# Patient Record
Sex: Male | Born: 1935
Health system: Southern US, Community
[De-identification: ages and names within clinical notes are randomized; demographics above are authoritative.]

## PROBLEM LIST (undated history)

## (undated) DIAGNOSIS — I214 Non-ST elevation (NSTEMI) myocardial infarction: Secondary | ICD-10-CM

## (undated) DIAGNOSIS — I10 Essential (primary) hypertension: Secondary | ICD-10-CM

## (undated) DIAGNOSIS — Z8719 Personal history of other diseases of the digestive system: Secondary | ICD-10-CM

## (undated) DIAGNOSIS — N182 Chronic kidney disease, stage 2 (mild): Secondary | ICD-10-CM

## (undated) DIAGNOSIS — E119 Type 2 diabetes mellitus without complications: Secondary | ICD-10-CM

## (undated) DIAGNOSIS — E785 Hyperlipidemia, unspecified: Secondary | ICD-10-CM

## (undated) DIAGNOSIS — I2510 Atherosclerotic heart disease of native coronary artery without angina pectoris: Secondary | ICD-10-CM

## (undated) HISTORY — PX: CATARACT EXTRACTION W/ INTRAOCULAR LENS IMPLANT: SHX1309

## (undated) HISTORY — PX: TONSILLECTOMY: SUR1361

## (undated) HISTORY — PX: EYE SURGERY: SHX253

---

## 2015-10-10 DIAGNOSIS — D099 Carcinoma in situ, unspecified: Secondary | ICD-10-CM

## 2015-10-10 DIAGNOSIS — C4492 Squamous cell carcinoma of skin, unspecified: Secondary | ICD-10-CM

## 2015-10-10 HISTORY — DX: Squamous cell carcinoma of skin, unspecified: C44.92

## 2015-10-10 HISTORY — DX: Carcinoma in situ, unspecified: D09.9

## 2016-03-25 DIAGNOSIS — B351 Tinea unguium: Secondary | ICD-10-CM | POA: Diagnosis not present

## 2016-03-25 DIAGNOSIS — E1142 Type 2 diabetes mellitus with diabetic polyneuropathy: Secondary | ICD-10-CM | POA: Diagnosis not present

## 2016-03-25 DIAGNOSIS — L609 Nail disorder, unspecified: Secondary | ICD-10-CM | POA: Diagnosis not present

## 2016-03-25 DIAGNOSIS — E1140 Type 2 diabetes mellitus with diabetic neuropathy, unspecified: Secondary | ICD-10-CM | POA: Diagnosis not present

## 2016-04-03 DIAGNOSIS — I10 Essential (primary) hypertension: Secondary | ICD-10-CM | POA: Diagnosis not present

## 2016-04-03 DIAGNOSIS — E1142 Type 2 diabetes mellitus with diabetic polyneuropathy: Secondary | ICD-10-CM | POA: Diagnosis not present

## 2016-04-11 DIAGNOSIS — K224 Dyskinesia of esophagus: Secondary | ICD-10-CM | POA: Diagnosis not present

## 2016-04-11 DIAGNOSIS — E663 Overweight: Secondary | ICD-10-CM | POA: Diagnosis not present

## 2016-04-11 DIAGNOSIS — E1359 Other specified diabetes mellitus with other circulatory complications: Secondary | ICD-10-CM | POA: Diagnosis not present

## 2016-04-11 DIAGNOSIS — I10 Essential (primary) hypertension: Secondary | ICD-10-CM | POA: Diagnosis not present

## 2016-04-11 DIAGNOSIS — E559 Vitamin D deficiency, unspecified: Secondary | ICD-10-CM | POA: Diagnosis not present

## 2016-04-11 DIAGNOSIS — E785 Hyperlipidemia, unspecified: Secondary | ICD-10-CM | POA: Diagnosis not present

## 2016-04-11 DIAGNOSIS — E1165 Type 2 diabetes mellitus with hyperglycemia: Secondary | ICD-10-CM | POA: Diagnosis not present

## 2016-04-18 DIAGNOSIS — E1142 Type 2 diabetes mellitus with diabetic polyneuropathy: Secondary | ICD-10-CM | POA: Diagnosis not present

## 2016-04-18 DIAGNOSIS — I10 Essential (primary) hypertension: Secondary | ICD-10-CM | POA: Diagnosis not present

## 2016-04-21 DIAGNOSIS — I10 Essential (primary) hypertension: Secondary | ICD-10-CM | POA: Diagnosis not present

## 2016-04-21 DIAGNOSIS — E785 Hyperlipidemia, unspecified: Secondary | ICD-10-CM | POA: Diagnosis not present

## 2016-04-21 DIAGNOSIS — Z9114 Patient's other noncompliance with medication regimen: Secondary | ICD-10-CM | POA: Diagnosis not present

## 2016-04-21 DIAGNOSIS — I7389 Other specified peripheral vascular diseases: Secondary | ICD-10-CM | POA: Diagnosis not present

## 2016-04-21 DIAGNOSIS — R319 Hematuria, unspecified: Secondary | ICD-10-CM | POA: Diagnosis not present

## 2016-04-21 DIAGNOSIS — E663 Overweight: Secondary | ICD-10-CM | POA: Diagnosis not present

## 2016-04-21 DIAGNOSIS — K8020 Calculus of gallbladder without cholecystitis without obstruction: Secondary | ICD-10-CM | POA: Diagnosis not present

## 2016-04-21 DIAGNOSIS — E559 Vitamin D deficiency, unspecified: Secondary | ICD-10-CM | POA: Diagnosis not present

## 2016-04-21 DIAGNOSIS — E1359 Other specified diabetes mellitus with other circulatory complications: Secondary | ICD-10-CM | POA: Diagnosis not present

## 2016-04-21 DIAGNOSIS — E1165 Type 2 diabetes mellitus with hyperglycemia: Secondary | ICD-10-CM | POA: Diagnosis not present

## 2016-04-21 DIAGNOSIS — K224 Dyskinesia of esophagus: Secondary | ICD-10-CM | POA: Diagnosis not present

## 2016-04-21 DIAGNOSIS — R938 Abnormal findings on diagnostic imaging of other specified body structures: Secondary | ICD-10-CM | POA: Diagnosis not present

## 2016-04-30 DIAGNOSIS — K8020 Calculus of gallbladder without cholecystitis without obstruction: Secondary | ICD-10-CM | POA: Diagnosis not present

## 2016-04-30 DIAGNOSIS — N281 Cyst of kidney, acquired: Secondary | ICD-10-CM | POA: Diagnosis not present

## 2016-04-30 DIAGNOSIS — R918 Other nonspecific abnormal finding of lung field: Secondary | ICD-10-CM | POA: Diagnosis not present

## 2016-05-16 DIAGNOSIS — I10 Essential (primary) hypertension: Secondary | ICD-10-CM | POA: Diagnosis not present

## 2016-05-16 DIAGNOSIS — E1142 Type 2 diabetes mellitus with diabetic polyneuropathy: Secondary | ICD-10-CM | POA: Diagnosis not present

## 2016-05-20 DIAGNOSIS — I10 Essential (primary) hypertension: Secondary | ICD-10-CM | POA: Diagnosis not present

## 2016-05-20 DIAGNOSIS — E1142 Type 2 diabetes mellitus with diabetic polyneuropathy: Secondary | ICD-10-CM | POA: Diagnosis not present

## 2016-05-20 DIAGNOSIS — E559 Vitamin D deficiency, unspecified: Secondary | ICD-10-CM | POA: Diagnosis not present

## 2016-05-20 DIAGNOSIS — E1165 Type 2 diabetes mellitus with hyperglycemia: Secondary | ICD-10-CM | POA: Diagnosis not present

## 2016-05-27 DIAGNOSIS — Z9114 Patient's other noncompliance with medication regimen: Secondary | ICD-10-CM | POA: Diagnosis not present

## 2016-05-27 DIAGNOSIS — E1359 Other specified diabetes mellitus with other circulatory complications: Secondary | ICD-10-CM | POA: Diagnosis not present

## 2016-05-27 DIAGNOSIS — E785 Hyperlipidemia, unspecified: Secondary | ICD-10-CM | POA: Diagnosis not present

## 2016-05-27 DIAGNOSIS — I7389 Other specified peripheral vascular diseases: Secondary | ICD-10-CM | POA: Diagnosis not present

## 2016-05-27 DIAGNOSIS — I10 Essential (primary) hypertension: Secondary | ICD-10-CM | POA: Diagnosis not present

## 2016-05-27 DIAGNOSIS — K224 Dyskinesia of esophagus: Secondary | ICD-10-CM | POA: Diagnosis not present

## 2016-05-27 DIAGNOSIS — E559 Vitamin D deficiency, unspecified: Secondary | ICD-10-CM | POA: Diagnosis not present

## 2016-05-27 DIAGNOSIS — E663 Overweight: Secondary | ICD-10-CM | POA: Diagnosis not present

## 2016-05-27 DIAGNOSIS — E1165 Type 2 diabetes mellitus with hyperglycemia: Secondary | ICD-10-CM | POA: Diagnosis not present

## 2016-06-03 DIAGNOSIS — E1140 Type 2 diabetes mellitus with diabetic neuropathy, unspecified: Secondary | ICD-10-CM | POA: Diagnosis not present

## 2016-06-03 DIAGNOSIS — L609 Nail disorder, unspecified: Secondary | ICD-10-CM | POA: Diagnosis not present

## 2016-06-03 DIAGNOSIS — E1142 Type 2 diabetes mellitus with diabetic polyneuropathy: Secondary | ICD-10-CM | POA: Diagnosis not present

## 2016-06-03 DIAGNOSIS — B351 Tinea unguium: Secondary | ICD-10-CM | POA: Diagnosis not present

## 2016-07-09 DIAGNOSIS — I10 Essential (primary) hypertension: Secondary | ICD-10-CM | POA: Diagnosis not present

## 2016-07-09 DIAGNOSIS — E1142 Type 2 diabetes mellitus with diabetic polyneuropathy: Secondary | ICD-10-CM | POA: Diagnosis not present

## 2016-07-18 DIAGNOSIS — A681 Tick-borne relapsing fever: Secondary | ICD-10-CM | POA: Diagnosis not present

## 2016-08-08 DIAGNOSIS — I10 Essential (primary) hypertension: Secondary | ICD-10-CM | POA: Diagnosis not present

## 2016-08-08 DIAGNOSIS — E1142 Type 2 diabetes mellitus with diabetic polyneuropathy: Secondary | ICD-10-CM | POA: Diagnosis not present

## 2016-08-19 DIAGNOSIS — L609 Nail disorder, unspecified: Secondary | ICD-10-CM | POA: Diagnosis not present

## 2016-08-19 DIAGNOSIS — E1140 Type 2 diabetes mellitus with diabetic neuropathy, unspecified: Secondary | ICD-10-CM | POA: Diagnosis not present

## 2016-08-19 DIAGNOSIS — E1142 Type 2 diabetes mellitus with diabetic polyneuropathy: Secondary | ICD-10-CM | POA: Diagnosis not present

## 2016-08-19 DIAGNOSIS — B351 Tinea unguium: Secondary | ICD-10-CM | POA: Diagnosis not present

## 2016-08-25 DIAGNOSIS — E1142 Type 2 diabetes mellitus with diabetic polyneuropathy: Secondary | ICD-10-CM | POA: Diagnosis not present

## 2016-08-25 DIAGNOSIS — I10 Essential (primary) hypertension: Secondary | ICD-10-CM | POA: Diagnosis not present

## 2016-08-26 DIAGNOSIS — I10 Essential (primary) hypertension: Secondary | ICD-10-CM | POA: Diagnosis not present

## 2016-08-26 DIAGNOSIS — E119 Type 2 diabetes mellitus without complications: Secondary | ICD-10-CM | POA: Diagnosis not present

## 2016-08-28 DIAGNOSIS — E1165 Type 2 diabetes mellitus with hyperglycemia: Secondary | ICD-10-CM | POA: Diagnosis not present

## 2016-09-03 DIAGNOSIS — Z9114 Patient's other noncompliance with medication regimen: Secondary | ICD-10-CM | POA: Diagnosis not present

## 2016-09-03 DIAGNOSIS — I7389 Other specified peripheral vascular diseases: Secondary | ICD-10-CM | POA: Diagnosis not present

## 2016-09-03 DIAGNOSIS — E1165 Type 2 diabetes mellitus with hyperglycemia: Secondary | ICD-10-CM | POA: Diagnosis not present

## 2016-09-03 DIAGNOSIS — I10 Essential (primary) hypertension: Secondary | ICD-10-CM | POA: Diagnosis not present

## 2016-09-03 DIAGNOSIS — E663 Overweight: Secondary | ICD-10-CM | POA: Diagnosis not present

## 2016-09-03 DIAGNOSIS — E559 Vitamin D deficiency, unspecified: Secondary | ICD-10-CM | POA: Diagnosis not present

## 2016-09-03 DIAGNOSIS — E1359 Other specified diabetes mellitus with other circulatory complications: Secondary | ICD-10-CM | POA: Diagnosis not present

## 2016-09-03 DIAGNOSIS — E785 Hyperlipidemia, unspecified: Secondary | ICD-10-CM | POA: Diagnosis not present

## 2016-09-03 DIAGNOSIS — K224 Dyskinesia of esophagus: Secondary | ICD-10-CM | POA: Diagnosis not present

## 2016-09-22 DIAGNOSIS — K224 Dyskinesia of esophagus: Secondary | ICD-10-CM | POA: Diagnosis not present

## 2016-09-22 DIAGNOSIS — E1359 Other specified diabetes mellitus with other circulatory complications: Secondary | ICD-10-CM | POA: Diagnosis not present

## 2016-09-22 DIAGNOSIS — E785 Hyperlipidemia, unspecified: Secondary | ICD-10-CM | POA: Diagnosis not present

## 2016-09-22 DIAGNOSIS — I7389 Other specified peripheral vascular diseases: Secondary | ICD-10-CM | POA: Diagnosis not present

## 2016-09-22 DIAGNOSIS — E1165 Type 2 diabetes mellitus with hyperglycemia: Secondary | ICD-10-CM | POA: Diagnosis not present

## 2016-09-22 DIAGNOSIS — Z9114 Patient's other noncompliance with medication regimen: Secondary | ICD-10-CM | POA: Diagnosis not present

## 2016-09-22 DIAGNOSIS — E663 Overweight: Secondary | ICD-10-CM | POA: Diagnosis not present

## 2016-09-22 DIAGNOSIS — I10 Essential (primary) hypertension: Secondary | ICD-10-CM | POA: Diagnosis not present

## 2016-09-22 DIAGNOSIS — E559 Vitamin D deficiency, unspecified: Secondary | ICD-10-CM | POA: Diagnosis not present

## 2016-10-06 DIAGNOSIS — E1359 Other specified diabetes mellitus with other circulatory complications: Secondary | ICD-10-CM | POA: Diagnosis not present

## 2016-10-06 DIAGNOSIS — E785 Hyperlipidemia, unspecified: Secondary | ICD-10-CM | POA: Diagnosis not present

## 2016-10-06 DIAGNOSIS — E559 Vitamin D deficiency, unspecified: Secondary | ICD-10-CM | POA: Diagnosis not present

## 2016-10-06 DIAGNOSIS — Z9114 Patient's other noncompliance with medication regimen: Secondary | ICD-10-CM | POA: Diagnosis not present

## 2016-10-06 DIAGNOSIS — I7389 Other specified peripheral vascular diseases: Secondary | ICD-10-CM | POA: Diagnosis not present

## 2016-10-06 DIAGNOSIS — K224 Dyskinesia of esophagus: Secondary | ICD-10-CM | POA: Diagnosis not present

## 2016-10-06 DIAGNOSIS — E1165 Type 2 diabetes mellitus with hyperglycemia: Secondary | ICD-10-CM | POA: Diagnosis not present

## 2016-10-06 DIAGNOSIS — I10 Essential (primary) hypertension: Secondary | ICD-10-CM | POA: Diagnosis not present

## 2016-10-06 DIAGNOSIS — E663 Overweight: Secondary | ICD-10-CM | POA: Diagnosis not present

## 2016-10-09 DIAGNOSIS — H918X3 Other specified hearing loss, bilateral: Secondary | ICD-10-CM | POA: Diagnosis not present

## 2016-10-28 DIAGNOSIS — L609 Nail disorder, unspecified: Secondary | ICD-10-CM | POA: Diagnosis not present

## 2016-10-28 DIAGNOSIS — E1142 Type 2 diabetes mellitus with diabetic polyneuropathy: Secondary | ICD-10-CM | POA: Diagnosis not present

## 2016-10-28 DIAGNOSIS — E1140 Type 2 diabetes mellitus with diabetic neuropathy, unspecified: Secondary | ICD-10-CM | POA: Diagnosis not present

## 2016-10-28 DIAGNOSIS — B351 Tinea unguium: Secondary | ICD-10-CM | POA: Diagnosis not present

## 2016-10-31 DIAGNOSIS — H60392 Other infective otitis externa, left ear: Secondary | ICD-10-CM | POA: Diagnosis not present

## 2016-11-06 DIAGNOSIS — K224 Dyskinesia of esophagus: Secondary | ICD-10-CM | POA: Diagnosis not present

## 2016-11-06 DIAGNOSIS — I7389 Other specified peripheral vascular diseases: Secondary | ICD-10-CM | POA: Diagnosis not present

## 2016-11-06 DIAGNOSIS — E785 Hyperlipidemia, unspecified: Secondary | ICD-10-CM | POA: Diagnosis not present

## 2016-11-06 DIAGNOSIS — E1165 Type 2 diabetes mellitus with hyperglycemia: Secondary | ICD-10-CM | POA: Diagnosis not present

## 2016-11-11 DIAGNOSIS — K224 Dyskinesia of esophagus: Secondary | ICD-10-CM | POA: Diagnosis not present

## 2016-11-11 DIAGNOSIS — E1165 Type 2 diabetes mellitus with hyperglycemia: Secondary | ICD-10-CM | POA: Diagnosis not present

## 2016-11-11 DIAGNOSIS — I10 Essential (primary) hypertension: Secondary | ICD-10-CM | POA: Diagnosis not present

## 2016-11-11 DIAGNOSIS — E1359 Other specified diabetes mellitus with other circulatory complications: Secondary | ICD-10-CM | POA: Diagnosis not present

## 2016-11-11 DIAGNOSIS — E559 Vitamin D deficiency, unspecified: Secondary | ICD-10-CM | POA: Diagnosis not present

## 2016-11-11 DIAGNOSIS — E785 Hyperlipidemia, unspecified: Secondary | ICD-10-CM | POA: Diagnosis not present

## 2016-11-11 DIAGNOSIS — E663 Overweight: Secondary | ICD-10-CM | POA: Diagnosis not present

## 2016-11-11 DIAGNOSIS — I7389 Other specified peripheral vascular diseases: Secondary | ICD-10-CM | POA: Diagnosis not present

## 2016-11-25 DIAGNOSIS — M25562 Pain in left knee: Secondary | ICD-10-CM | POA: Diagnosis not present

## 2016-11-25 DIAGNOSIS — M1009 Idiopathic gout, multiple sites: Secondary | ICD-10-CM | POA: Diagnosis not present

## 2016-11-25 DIAGNOSIS — I10 Essential (primary) hypertension: Secondary | ICD-10-CM | POA: Diagnosis not present

## 2016-11-25 DIAGNOSIS — N183 Chronic kidney disease, stage 3 (moderate): Secondary | ICD-10-CM | POA: Diagnosis not present

## 2016-12-17 DIAGNOSIS — K224 Dyskinesia of esophagus: Secondary | ICD-10-CM | POA: Diagnosis not present

## 2016-12-17 DIAGNOSIS — E1359 Other specified diabetes mellitus with other circulatory complications: Secondary | ICD-10-CM | POA: Diagnosis not present

## 2016-12-17 DIAGNOSIS — E559 Vitamin D deficiency, unspecified: Secondary | ICD-10-CM | POA: Diagnosis not present

## 2016-12-17 DIAGNOSIS — E785 Hyperlipidemia, unspecified: Secondary | ICD-10-CM | POA: Diagnosis not present

## 2016-12-17 DIAGNOSIS — E663 Overweight: Secondary | ICD-10-CM | POA: Diagnosis not present

## 2016-12-17 DIAGNOSIS — E1165 Type 2 diabetes mellitus with hyperglycemia: Secondary | ICD-10-CM | POA: Diagnosis not present

## 2016-12-17 DIAGNOSIS — I10 Essential (primary) hypertension: Secondary | ICD-10-CM | POA: Diagnosis not present

## 2016-12-17 DIAGNOSIS — I7389 Other specified peripheral vascular diseases: Secondary | ICD-10-CM | POA: Diagnosis not present

## 2016-12-24 DIAGNOSIS — H2701 Aphakia, right eye: Secondary | ICD-10-CM | POA: Diagnosis not present

## 2016-12-24 DIAGNOSIS — E119 Type 2 diabetes mellitus without complications: Secondary | ICD-10-CM | POA: Diagnosis not present

## 2016-12-24 DIAGNOSIS — Z794 Long term (current) use of insulin: Secondary | ICD-10-CM | POA: Diagnosis not present

## 2016-12-24 DIAGNOSIS — H2512 Age-related nuclear cataract, left eye: Secondary | ICD-10-CM | POA: Diagnosis not present

## 2016-12-25 DIAGNOSIS — Z23 Encounter for immunization: Secondary | ICD-10-CM | POA: Diagnosis not present

## 2017-01-09 DIAGNOSIS — N183 Chronic kidney disease, stage 3 (moderate): Secondary | ICD-10-CM | POA: Diagnosis not present

## 2017-01-09 DIAGNOSIS — I10 Essential (primary) hypertension: Secondary | ICD-10-CM | POA: Diagnosis not present

## 2017-01-09 DIAGNOSIS — E1142 Type 2 diabetes mellitus with diabetic polyneuropathy: Secondary | ICD-10-CM | POA: Diagnosis not present

## 2017-01-22 DIAGNOSIS — H6122 Impacted cerumen, left ear: Secondary | ICD-10-CM | POA: Diagnosis not present

## 2017-01-22 DIAGNOSIS — M545 Low back pain: Secondary | ICD-10-CM | POA: Diagnosis not present

## 2017-02-11 DIAGNOSIS — E1142 Type 2 diabetes mellitus with diabetic polyneuropathy: Secondary | ICD-10-CM | POA: Diagnosis not present

## 2017-02-11 DIAGNOSIS — I10 Essential (primary) hypertension: Secondary | ICD-10-CM | POA: Diagnosis not present

## 2017-02-16 DIAGNOSIS — M9903 Segmental and somatic dysfunction of lumbar region: Secondary | ICD-10-CM | POA: Diagnosis not present

## 2017-02-16 DIAGNOSIS — M47816 Spondylosis without myelopathy or radiculopathy, lumbar region: Secondary | ICD-10-CM | POA: Diagnosis not present

## 2017-02-16 DIAGNOSIS — S338XXA Sprain of other parts of lumbar spine and pelvis, initial encounter: Secondary | ICD-10-CM | POA: Diagnosis not present

## 2017-02-17 DIAGNOSIS — M9903 Segmental and somatic dysfunction of lumbar region: Secondary | ICD-10-CM | POA: Diagnosis not present

## 2017-02-17 DIAGNOSIS — M47816 Spondylosis without myelopathy or radiculopathy, lumbar region: Secondary | ICD-10-CM | POA: Diagnosis not present

## 2017-02-17 DIAGNOSIS — S338XXA Sprain of other parts of lumbar spine and pelvis, initial encounter: Secondary | ICD-10-CM | POA: Diagnosis not present

## 2017-02-19 DIAGNOSIS — S338XXA Sprain of other parts of lumbar spine and pelvis, initial encounter: Secondary | ICD-10-CM | POA: Diagnosis not present

## 2017-02-19 DIAGNOSIS — M47816 Spondylosis without myelopathy or radiculopathy, lumbar region: Secondary | ICD-10-CM | POA: Diagnosis not present

## 2017-02-19 DIAGNOSIS — M9903 Segmental and somatic dysfunction of lumbar region: Secondary | ICD-10-CM | POA: Diagnosis not present

## 2017-02-20 DIAGNOSIS — M9903 Segmental and somatic dysfunction of lumbar region: Secondary | ICD-10-CM | POA: Diagnosis not present

## 2017-02-20 DIAGNOSIS — E559 Vitamin D deficiency, unspecified: Secondary | ICD-10-CM | POA: Diagnosis not present

## 2017-02-20 DIAGNOSIS — I7389 Other specified peripheral vascular diseases: Secondary | ICD-10-CM | POA: Diagnosis not present

## 2017-02-20 DIAGNOSIS — I10 Essential (primary) hypertension: Secondary | ICD-10-CM | POA: Diagnosis not present

## 2017-02-20 DIAGNOSIS — E1165 Type 2 diabetes mellitus with hyperglycemia: Secondary | ICD-10-CM | POA: Diagnosis not present

## 2017-02-20 DIAGNOSIS — S338XXA Sprain of other parts of lumbar spine and pelvis, initial encounter: Secondary | ICD-10-CM | POA: Diagnosis not present

## 2017-02-20 DIAGNOSIS — M47816 Spondylosis without myelopathy or radiculopathy, lumbar region: Secondary | ICD-10-CM | POA: Diagnosis not present

## 2017-02-26 DIAGNOSIS — M545 Low back pain: Secondary | ICD-10-CM | POA: Diagnosis not present

## 2017-02-26 DIAGNOSIS — E1165 Type 2 diabetes mellitus with hyperglycemia: Secondary | ICD-10-CM | POA: Diagnosis not present

## 2017-02-26 DIAGNOSIS — I10 Essential (primary) hypertension: Secondary | ICD-10-CM | POA: Diagnosis not present

## 2017-03-03 DIAGNOSIS — E1359 Other specified diabetes mellitus with other circulatory complications: Secondary | ICD-10-CM | POA: Diagnosis not present

## 2017-03-03 DIAGNOSIS — I7389 Other specified peripheral vascular diseases: Secondary | ICD-10-CM | POA: Diagnosis not present

## 2017-03-03 DIAGNOSIS — K224 Dyskinesia of esophagus: Secondary | ICD-10-CM | POA: Diagnosis not present

## 2017-03-03 DIAGNOSIS — E663 Overweight: Secondary | ICD-10-CM | POA: Diagnosis not present

## 2017-03-03 DIAGNOSIS — E1165 Type 2 diabetes mellitus with hyperglycemia: Secondary | ICD-10-CM | POA: Diagnosis not present

## 2017-03-03 DIAGNOSIS — E785 Hyperlipidemia, unspecified: Secondary | ICD-10-CM | POA: Diagnosis not present

## 2017-03-03 DIAGNOSIS — E559 Vitamin D deficiency, unspecified: Secondary | ICD-10-CM | POA: Diagnosis not present

## 2017-03-03 DIAGNOSIS — I10 Essential (primary) hypertension: Secondary | ICD-10-CM | POA: Diagnosis not present

## 2017-03-06 DIAGNOSIS — H2512 Age-related nuclear cataract, left eye: Secondary | ICD-10-CM | POA: Diagnosis not present

## 2017-03-18 DIAGNOSIS — M47816 Spondylosis without myelopathy or radiculopathy, lumbar region: Secondary | ICD-10-CM | POA: Diagnosis not present

## 2017-03-18 DIAGNOSIS — S338XXA Sprain of other parts of lumbar spine and pelvis, initial encounter: Secondary | ICD-10-CM | POA: Diagnosis not present

## 2017-03-18 DIAGNOSIS — M9903 Segmental and somatic dysfunction of lumbar region: Secondary | ICD-10-CM | POA: Diagnosis not present

## 2017-03-19 DIAGNOSIS — S338XXA Sprain of other parts of lumbar spine and pelvis, initial encounter: Secondary | ICD-10-CM | POA: Diagnosis not present

## 2017-03-19 DIAGNOSIS — M47816 Spondylosis without myelopathy or radiculopathy, lumbar region: Secondary | ICD-10-CM | POA: Diagnosis not present

## 2017-03-19 DIAGNOSIS — M9903 Segmental and somatic dysfunction of lumbar region: Secondary | ICD-10-CM | POA: Diagnosis not present

## 2017-03-26 DIAGNOSIS — H52202 Unspecified astigmatism, left eye: Secondary | ICD-10-CM | POA: Diagnosis not present

## 2017-03-26 DIAGNOSIS — H25812 Combined forms of age-related cataract, left eye: Secondary | ICD-10-CM | POA: Diagnosis not present

## 2017-03-26 DIAGNOSIS — H2701 Aphakia, right eye: Secondary | ICD-10-CM | POA: Diagnosis not present

## 2017-04-08 DIAGNOSIS — Z48810 Encounter for surgical aftercare following surgery on the sense organs: Secondary | ICD-10-CM | POA: Diagnosis not present

## 2017-04-08 DIAGNOSIS — H47291 Other optic atrophy, right eye: Secondary | ICD-10-CM | POA: Diagnosis not present

## 2017-04-08 DIAGNOSIS — Z961 Presence of intraocular lens: Secondary | ICD-10-CM | POA: Diagnosis not present

## 2017-04-08 DIAGNOSIS — Z794 Long term (current) use of insulin: Secondary | ICD-10-CM | POA: Diagnosis not present

## 2017-04-08 DIAGNOSIS — E119 Type 2 diabetes mellitus without complications: Secondary | ICD-10-CM | POA: Diagnosis not present

## 2017-04-08 DIAGNOSIS — H5211 Myopia, right eye: Secondary | ICD-10-CM | POA: Diagnosis not present

## 2017-04-08 DIAGNOSIS — Z882 Allergy status to sulfonamides status: Secondary | ICD-10-CM | POA: Diagnosis not present

## 2017-05-12 DIAGNOSIS — D229 Melanocytic nevi, unspecified: Secondary | ICD-10-CM | POA: Diagnosis not present

## 2017-05-12 DIAGNOSIS — L570 Actinic keratosis: Secondary | ICD-10-CM | POA: Diagnosis not present

## 2017-05-12 DIAGNOSIS — L821 Other seborrheic keratosis: Secondary | ICD-10-CM | POA: Diagnosis not present

## 2017-05-19 DIAGNOSIS — E113213 Type 2 diabetes mellitus with mild nonproliferative diabetic retinopathy with macular edema, bilateral: Secondary | ICD-10-CM | POA: Diagnosis not present

## 2017-05-19 DIAGNOSIS — Z794 Long term (current) use of insulin: Secondary | ICD-10-CM | POA: Diagnosis not present

## 2017-05-19 DIAGNOSIS — H35372 Puckering of macula, left eye: Secondary | ICD-10-CM | POA: Diagnosis not present

## 2017-05-19 DIAGNOSIS — H53001 Unspecified amblyopia, right eye: Secondary | ICD-10-CM | POA: Diagnosis not present

## 2017-05-19 DIAGNOSIS — H2701 Aphakia, right eye: Secondary | ICD-10-CM | POA: Diagnosis not present

## 2017-05-19 DIAGNOSIS — Z961 Presence of intraocular lens: Secondary | ICD-10-CM | POA: Diagnosis not present

## 2017-06-01 ENCOUNTER — Inpatient Hospital Stay (HOSPITAL_COMMUNITY)
Admission: AD | Admit: 2017-06-01 | Discharge: 2017-06-03 | DRG: 247 | Disposition: A | Discharge status: Home / Self Care | Payer: Medicare Other | Source: Other Acute Inpatient Hospital | Attending: Cardiology | Admitting: Cardiology

## 2017-06-01 ENCOUNTER — Encounter (HOSPITAL_COMMUNITY): Payer: Self-pay | Admitting: Cardiology

## 2017-06-01 ENCOUNTER — Other Ambulatory Visit: Payer: Self-pay

## 2017-06-01 DIAGNOSIS — Z833 Family history of diabetes mellitus: Secondary | ICD-10-CM

## 2017-06-01 DIAGNOSIS — M160 Bilateral primary osteoarthritis of hip: Secondary | ICD-10-CM | POA: Diagnosis not present

## 2017-06-01 DIAGNOSIS — R001 Bradycardia, unspecified: Secondary | ICD-10-CM | POA: Diagnosis present

## 2017-06-01 DIAGNOSIS — I2510 Atherosclerotic heart disease of native coronary artery without angina pectoris: Secondary | ICD-10-CM | POA: Diagnosis not present

## 2017-06-01 DIAGNOSIS — N184 Chronic kidney disease, stage 4 (severe): Secondary | ICD-10-CM

## 2017-06-01 DIAGNOSIS — Z87891 Personal history of nicotine dependence: Secondary | ICD-10-CM

## 2017-06-01 DIAGNOSIS — E119 Type 2 diabetes mellitus without complications: Secondary | ICD-10-CM

## 2017-06-01 DIAGNOSIS — I959 Hypotension, unspecified: Secondary | ICD-10-CM | POA: Diagnosis not present

## 2017-06-01 DIAGNOSIS — N183 Chronic kidney disease, stage 3 (moderate): Secondary | ICD-10-CM | POA: Diagnosis present

## 2017-06-01 DIAGNOSIS — Z8711 Personal history of peptic ulcer disease: Secondary | ICD-10-CM

## 2017-06-01 DIAGNOSIS — I214 Non-ST elevation (NSTEMI) myocardial infarction: Principal | ICD-10-CM

## 2017-06-01 DIAGNOSIS — I129 Hypertensive chronic kidney disease with stage 1 through stage 4 chronic kidney disease, or unspecified chronic kidney disease: Secondary | ICD-10-CM | POA: Diagnosis present

## 2017-06-01 DIAGNOSIS — I440 Atrioventricular block, first degree: Secondary | ICD-10-CM | POA: Diagnosis present

## 2017-06-01 DIAGNOSIS — E785 Hyperlipidemia, unspecified: Secondary | ICD-10-CM | POA: Diagnosis not present

## 2017-06-01 DIAGNOSIS — M25562 Pain in left knee: Secondary | ICD-10-CM | POA: Diagnosis not present

## 2017-06-01 DIAGNOSIS — Z794 Long term (current) use of insulin: Secondary | ICD-10-CM

## 2017-06-01 DIAGNOSIS — Z8249 Family history of ischemic heart disease and other diseases of the circulatory system: Secondary | ICD-10-CM | POA: Diagnosis not present

## 2017-06-01 DIAGNOSIS — E876 Hypokalemia: Secondary | ICD-10-CM | POA: Diagnosis not present

## 2017-06-01 DIAGNOSIS — I10 Essential (primary) hypertension: Secondary | ICD-10-CM | POA: Diagnosis not present

## 2017-06-01 DIAGNOSIS — E1165 Type 2 diabetes mellitus with hyperglycemia: Secondary | ICD-10-CM | POA: Diagnosis not present

## 2017-06-01 DIAGNOSIS — N189 Chronic kidney disease, unspecified: Secondary | ICD-10-CM | POA: Diagnosis not present

## 2017-06-01 DIAGNOSIS — E860 Dehydration: Secondary | ICD-10-CM | POA: Diagnosis not present

## 2017-06-01 DIAGNOSIS — M549 Dorsalgia, unspecified: Secondary | ICD-10-CM | POA: Diagnosis present

## 2017-06-01 DIAGNOSIS — R079 Chest pain, unspecified: Secondary | ICD-10-CM | POA: Diagnosis not present

## 2017-06-01 DIAGNOSIS — E1122 Type 2 diabetes mellitus with diabetic chronic kidney disease: Secondary | ICD-10-CM | POA: Diagnosis present

## 2017-06-01 DIAGNOSIS — Z79899 Other long term (current) drug therapy: Secondary | ICD-10-CM | POA: Diagnosis not present

## 2017-06-01 DIAGNOSIS — Z955 Presence of coronary angioplasty implant and graft: Secondary | ICD-10-CM

## 2017-06-01 DIAGNOSIS — M1712 Unilateral primary osteoarthritis, left knee: Secondary | ICD-10-CM | POA: Diagnosis not present

## 2017-06-01 HISTORY — DX: Non-ST elevation (NSTEMI) myocardial infarction: I21.4

## 2017-06-01 HISTORY — DX: Personal history of other diseases of the digestive system: Z87.19

## 2017-06-01 HISTORY — DX: Atherosclerotic heart disease of native coronary artery without angina pectoris: I25.10

## 2017-06-01 HISTORY — DX: Essential (primary) hypertension: I10

## 2017-06-01 HISTORY — DX: Type 2 diabetes mellitus without complications: E11.9

## 2017-06-01 HISTORY — DX: Hyperlipidemia, unspecified: E78.5

## 2017-06-01 HISTORY — DX: Chronic kidney disease, stage 2 (mild): N18.2

## 2017-06-01 LAB — CBC
HCT: 42.5 % (ref 39.0–52.0)
Hemoglobin: 14.7 g/dL (ref 13.0–17.0)
MCH: 30.1 pg (ref 26.0–34.0)
MCHC: 34.6 g/dL (ref 30.0–36.0)
MCV: 86.9 fL (ref 78.0–100.0)
Platelets: 209 10*3/uL (ref 150–400)
RBC: 4.89 MIL/uL (ref 4.22–5.81)
RDW: 13.4 % (ref 11.5–15.5)
WBC: 12.7 10*3/uL — ABNORMAL HIGH (ref 4.0–10.5)

## 2017-06-01 LAB — GLUCOSE, CAPILLARY
Glucose-Capillary: 120 mg/dL — ABNORMAL HIGH (ref 65–99)
Glucose-Capillary: 188 mg/dL — ABNORMAL HIGH (ref 65–99)

## 2017-06-01 LAB — HEMOGLOBIN A1C
Hgb A1c MFr Bld: 9.1 % — ABNORMAL HIGH (ref 4.8–5.6)
Mean Plasma Glucose: 214.47 mg/dL

## 2017-06-01 LAB — TROPONIN I: Troponin I: 10.57 ng/mL (ref <0.03)

## 2017-06-01 LAB — TSH: TSH: 1.318 u[IU]/mL (ref 0.350–4.500)

## 2017-06-01 MED ORDER — HEPARIN BOLUS VIA INFUSION
4000.0000 [IU] | Freq: Once | INTRAVENOUS | Status: AC
  Administered 2017-06-01: 4000 [IU] via INTRAVENOUS
  Filled 2017-06-01: qty 4000

## 2017-06-01 MED ORDER — ASPIRIN 81 MG PO CHEW
81.0000 mg | CHEWABLE_TABLET | ORAL | Status: AC
  Administered 2017-06-02: 81 mg via ORAL
  Filled 2017-06-01: qty 1

## 2017-06-01 MED ORDER — ASPIRIN EC 81 MG PO TBEC
81.0000 mg | DELAYED_RELEASE_TABLET | Freq: Every day | ORAL | Status: DC
  Administered 2017-06-03: 81 mg via ORAL
  Filled 2017-06-01: qty 1

## 2017-06-01 MED ORDER — INSULIN ASPART 100 UNIT/ML ~~LOC~~ SOLN
0.0000 [IU] | Freq: Three times a day (TID) | SUBCUTANEOUS | Status: DC
  Administered 2017-06-02: 14:00:00 2 [IU] via SUBCUTANEOUS
  Administered 2017-06-02 (×2): 3 [IU] via SUBCUTANEOUS
  Administered 2017-06-03: 13:00:00 5 [IU] via SUBCUTANEOUS
  Administered 2017-06-03: 3 [IU] via SUBCUTANEOUS

## 2017-06-01 MED ORDER — AMLODIPINE BESYLATE 5 MG PO TABS
5.0000 mg | ORAL_TABLET | Freq: Every day | ORAL | Status: DC
  Administered 2017-06-02 – 2017-06-03 (×2): 5 mg via ORAL
  Filled 2017-06-01 (×2): qty 1

## 2017-06-01 MED ORDER — ASPIRIN EC 81 MG PO TBEC: 81.0000 mg | DELAYED_RELEASE_TABLET | Freq: Every day | ORAL | Status: DC | PRN

## 2017-06-01 MED ORDER — SODIUM CHLORIDE 0.9 % WEIGHT BASED INFUSION
1.0000 mL/kg/h | INTRAVENOUS | Status: DC
  Administered 2017-06-01 – 2017-06-02 (×2): 1 mL/kg/h via INTRAVENOUS

## 2017-06-01 MED ORDER — HEPARIN (PORCINE) IN NACL 100-0.45 UNIT/ML-% IJ SOLN
1000.0000 [IU]/h | INTRAMUSCULAR | Status: DC
  Administered 2017-06-01: 1000 [IU]/h via INTRAVENOUS
  Filled 2017-06-01: qty 250

## 2017-06-01 MED ORDER — SODIUM CHLORIDE 0.9% FLUSH
3.0000 mL | Freq: Two times a day (BID) | INTRAVENOUS | Status: DC
  Administered 2017-06-02: 3 mL via INTRAVENOUS

## 2017-06-01 MED ORDER — METOPROLOL TARTRATE 12.5 MG HALF TABLET
12.5000 mg | ORAL_TABLET | Freq: Two times a day (BID) | ORAL | Status: DC
  Administered 2017-06-01 – 2017-06-03 (×4): 12.5 mg via ORAL
  Filled 2017-06-01 (×4): qty 1

## 2017-06-01 MED ORDER — INSULIN ASPART 100 UNIT/ML ~~LOC~~ SOLN: 0.0000 [IU] | Freq: Every day | SUBCUTANEOUS | Status: DC

## 2017-06-01 MED ORDER — NITROGLYCERIN 0.4 MG SL SUBL: 0.4000 mg | SUBLINGUAL_TABLET | SUBLINGUAL | Status: DC | PRN

## 2017-06-01 MED ORDER — ACETAMINOPHEN 325 MG PO TABS
650.0000 mg | ORAL_TABLET | ORAL | Status: DC | PRN
  Administered 2017-06-02 (×2): 650 mg via ORAL
  Filled 2017-06-01 (×2): qty 2

## 2017-06-01 MED ORDER — SODIUM CHLORIDE 0.9 % IV SOLN: 250.0000 mL | INTRAVENOUS | Status: DC | PRN

## 2017-06-01 MED ORDER — ONDANSETRON HCL 4 MG/2ML IJ SOLN: 4.0000 mg | Freq: Four times a day (QID) | INTRAMUSCULAR | Status: DC | PRN

## 2017-06-01 MED ORDER — SODIUM CHLORIDE 0.9% FLUSH: 3.0000 mL | INTRAVENOUS | Status: DC | PRN

## 2017-06-01 MED ORDER — ATORVASTATIN CALCIUM 80 MG PO TABS
80.0000 mg | ORAL_TABLET | Freq: Every day | ORAL | Status: DC
  Administered 2017-06-01 – 2017-06-02 (×2): 80 mg via ORAL
  Filled 2017-06-01 (×2): qty 1

## 2017-06-01 MED ORDER — NITROGLYCERIN 2 % TD OINT
1.0000 [in_us] | TOPICAL_OINTMENT | Freq: Four times a day (QID) | TRANSDERMAL | Status: DC
  Administered 2017-06-01 – 2017-06-02 (×4): 1 [in_us] via TOPICAL
  Filled 2017-06-01 (×2): qty 30

## 2017-06-01 NOTE — H&P (Addendum)
Cardiology Admission History and Physical:   Patient ID: Erik Logan; MRN: [id number]; DOB: 01/10/1929   Admission date: 06/01/2017  Primary Care Provider: No primary care provider on file. Primary Cardiologist: Kirk Ruths, MD new  Chief Complaint: Chest pain  Patient Profile:   Erik Logan is a 84 y.o. male with a history of chronic renal insufficiency, type 2 diabetes on insulin, hypertension.  History of Present Illness:   [name] presented to the [facility] emergency department for evaluation of chest pain that started around 6 PM last night and felt like indigestion to the patient.  The patient was noted to have elevated troponin without ST elevation on EKG.  He was transferred to [facility] for further cardiac evaluation.  The patient was given nitroglycerin topically and became hypotensive in the ED. The nitroglycerin was removed and he was given normal saline 250 mL bolus with improvement in blood pressure.  Upon my evaluation the patient is currently without chest pain.  He denies any previous MI or cardiac issues and has never had any cardiac evaluation. He tells me that he had had some fruit cake with nuts yesterday afternoon and around 6 PM he developed chest discomfort/tightness that he felt was indigestion.  He took a Tums and "spit up some nuts" from the fruit cake.  He had no associated dyspnea, palpitations, lightheadedness, diaphoresis.  His discomfort resolved after about 1-1/2-2 hours and he went on to bed.  He was awakened around 4 AM with the same central nonradiating chest pressure again with no associated symptoms except for mild nausea.  He has had heartburn before but this was different.  His pain continued and he presented to the ED in [place] at about 9 AM.  He was given topical nitroglycerin and the pain resolved.  He did note some return of his discomfort while he was in the ambulance on his way to [facility] but that did not last long.  The patient  denies any bleeding issues.  He did have a duodenal ulcer 30-40 years ago but no problems since then.  Prior to this episode the patient was not aware of any exertional chest discomfort.  He has noted some mild dyspnea on exertion with taking his trash out to the curb over the last couple of months.  He is a remote smoker having quit 30-40 years ago.  He does have a history of type 2 diabetes on insulin, hypertension and some hyperlipidemia that apparently was not enough that it was felt he needed treatment.  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  He has no known family history of cardiac issues.   Outside hospital chart reviewed with the following significant findings: Serum creatinine 1.31, potassium 4.5, magnesium 1.8, hemoglobin 14.4, WBCs 8.2, platelet count 198 Troponin 0.23, 0.50 Chest x-ray with progression of mild bibasilar airspace disease.  Possible progressive fibrosis versus atelectasis/pneumonia Hip/pelvis x-ray: Mild bilateral hip osteoarthritis.  No acute or focal abnormality Left knee x-ray: Loss of joint space in the medial compartment with medial and patellofemoral degenerative spurring EKG showed sinus bradycardia with prolonged PR interval, 52 bpm, and nonspecific T wave abnormalities, follow-up EKG with no significant change except heart rate of 45 bpm  There are some telemetry strips in the chart from [facility] that look like could represent atrial fibrillation however there was no mention of this in the documentation.  I did call and speak to Dr. Felton Clinton who said that he noticed no atrial fibrillation while the patient was in the emergency room there.   Past Medical History:  Diagnosis Date  . Chronic renal insufficiency, stage 2 (mild)   . Diabetes mellitus ([place])   . Hyperlipidemia   . Hypertension     Medications Prior to Admission: Prior to Admission medications     Medication Sig Start Date End Date Taking? Authorizing Provider  amLODipine (NORVASC) 5 MG tablet Take 5 mg by mouth daily. 03/12/17  Yes [provider]  aspirin EC 81 MG tablet Take 81 mg by mouth daily as needed (chest pain).    Yes [provider]  insulin lispro (HUMALOG) 100 UNIT/ML injection Inject 5 Units into the skin See admin instructions. Per Sliding Scale   Yes [provider]  lisinopril-hydrochlorothiazide (PRINZIDE,ZESTORETIC) 20-25 MG tablet Take 1 tablet by mouth daily.   Yes [provider]  metoprolol tartrate (LOPRESSOR) 50 MG tablet Take 50 mg by mouth daily. 04/13/17  Yes [provider]  TRESIBA FLEXTOUCH 100 UNIT/ML SOPN FlexTouch Pen Inject 25 Units into the skin every morning.  04/22/17  Yes [provider]     Allergies:   Allergies not on file  Social History:   Social History   Socioeconomic History  . Marital status: Married    Spouse name: Not on file  . Number of children: Not on file  . Years of education: Not on file  . Highest education level: Not on file  Social Needs  . Financial resource strain: Not on file  . Food insecurity - worry: Not on file  . Food insecurity - inability: Not on file  . Transportation needs - medical: Not on file  . Transportation needs - non-medical: Not on file  Occupational History  . Not on file  Tobacco Use  . Smoking status: Former Research scientist (life sciences)  . Tobacco comment: Quit around age 78  Substance and Sexual Activity  . Alcohol use: No    Frequency: Never  . Drug use: Not on file  . Sexual activity: Not on file  Other Topics Concern  . Not on file  Social History Narrative  . Not on file    Family History:   The patient's family history includes Diabetes in his mother; Healthy in his sister; Hypertension in his mother; Pulmonary disease in his father and sister.    ROS:  Please see the history of present illness.  All other ROS reviewed and negative.      Physical Exam/Data:   Vitals:   06/01/17 1536  BP: (!) 168/84  Pulse: 65  Temp: (!) 97.5 F (36.4 C)  TempSrc: Oral  SpO2: 99%   No intake or output data in the 24 hours ending 06/01/17 1747 There were no vitals filed for this visit. There is no height or weight on file to calculate BMI.  General:  Well nourished, well developed, in no acute distress HEENT: normal Lymph: no adenopathy Neck: no JVD Endocrine:  No thryomegaly Vascular: No carotid bruits; FA pulses 2+ bilaterally without bruits  Cardiac:  normal S1, S2; RRR; no murmur  Lungs:  clear to auscultation bilaterally, no wheezing, rhonchi or rales  Abd: soft, nontender, no hepatomegaly  Ext: no edema Musculoskeletal:  No deformities, BUE and BLE strength normal and equal Skin: warm and dry  Neuro:  CNs 2-12 intact, no focal abnormalities noted Psych:  Normal affect    EKG:  The  ECG that was done was personally reviewed and demonstrates sinus bradycardia with first-degree AV block, nonspecific T wave changes.  No previous EKG for comparison  Relevant CV Studies:  None  Laboratory Data:  ChemistryNo results for input(s): NA, K, CL, CO2, GLUCOSE, BUN, CREATININE, CALCIUM, GFRNONAA, GFRAA, ANIONGAP in the last 168 hours.  No results for input(s): PROT, ALBUMIN, AST, ALT, ALKPHOS, BILITOT in the last 168 hours. HematologyNo results for input(s): WBC, RBC, HGB, HCT, MCV, MCH, MCHC, RDW, PLT in the last 168 hours. Cardiac EnzymesNo results for input(s): TROPONINI in the last 168 hours. No results for input(s): TROPIPOC in the last 168 hours.  BNPNo results for input(s): BNP, PROBNP in the last 168 hours.  DDimer No results for input(s): DDIMER in the last 168 hours.  Radiology/Studies:  No results found.  Assessment and Plan:   NSTEMI -Patient with new onset of central nonradiating chest pressure last night without associated dyspnea or other symptoms except for mild nausea.  Recent dyspnea on exertion over the  past few months.  No signs or symptoms of heart failure. -CVD risk factors include type 2 diabetes on insulin, hypertension, hyperlipidemia, remote smoking history -Troponins 0.23, 0.50 at outside hospital.  We will continue to trend enzymes. -EKG shows sinus bradycardia in the 50s with nonspecific T wave changes.  No previous EKG for comparison -Serum creatinine 1.31 at outside hospital today. -Patient admitted to [facility].  We will continue to cycle troponins. -Initiate heparin IV. -Plan for cardiac catheterization tomorrow to evaluate coronary arteries. The patient understands that risks included but are not limited to stroke (1 in 1000), death (1 in 69), kidney failure [usually temporary] (1 in 500), bleeding (1 in 200), allergic reaction [possibly serious] (1 in 200).  -Continue beta-blocker but will reduce dose due to bradycardia. Continue aspirin and add statin.  -Continue Norvasc for blood pressure but hold lisinopril-hydrochlorothiazide in preparation for cardiac cath tomorrow -We will order Nitropaste.  Can discontinue if patient becomes hypotensive. -We will hydrate overnight.  -Echo ordered -Patient lives in [place] and so will probably follow up with our cardiology office in [place] after discharge.  Possible arrhythmia -The chart sent over by [facility] included some telemetry strips that could represent atrial fibrillation.  EKGs show sinus bradycardia as well as his telemetry here. I called Dr. Felton Clinton who stated that he noted no atrial fibrillation while the patient was in the ED at [facility].  -The patient has no history of atrial fibrillation and no palpitations. -We will monitor the patient here for any occurrences of atrial fibrillation.  Hypertension -Home medications include Norvasc 5 mg daily, Lopressor 50 mg daily, lisinopril/hydrochlorothiazide 20/25 mg daily -Patient hypotensive at outside hospital possibly related to nitroglycerin ointment which was subsequently  removed..  Blood pressure on arrival to [facility] 168/84.  -We will continue the patient's Norvasc and reduced dose of Lopressor due to bradycardia.  We will hold his lisinopril/hydrochlorothiazide in preparation for cardiac catheterization. -Nitropaste as ordered and will need to monitor blood pressure closely as he became hypotensive at previous hospital with Nitropaste.  Discontinue the Nitropaste if hypotension occurs.  Diabetes type 2 on insulin -Will obtain hemoglobin A1c for risk stratification -We will follow CBGs before meals and at bedtime with sliding scale insulin while hospitalized  Hyperlipidemia -Hx of elevated cholesterol in the past, not treated.  -Will check fasting lipid panel for risk stratification. -Initiate high intensity statin.  Hip and back pain -Back pain since lifting an air conditioner several  months ago.  -Bilateral hip pain. Xrays with no significant findings.  -Tylenol for pain.   Severity of Illness: The appropriate patient status for this patient is INPATIENT. Inpatient status is judged to be reasonable and necessary in order to provide the required intensity of service to ensure the patient's safety. The patient's presenting symptoms, physical exam findings, and initial radiographic and laboratory data in the context of their chronic comorbidities is felt to place them at high risk for further clinical deterioration. Furthermore, it is not anticipated that the patient will be medically stable for discharge from the hospital within 2 midnights of admission. The following factors support the patient status of inpatient.   " The patient's presenting symptoms include chest pain. " The worrisome physical exam findings include nonspecific T wave abnormality on EKG. " The initial radiographic and laboratory data are worrisome because of elevated troponins. " The chronic co-morbidities include diabetes type 2 on insulin, hypertension, hyperlipidemia, remote history  of smoking.   * I certify that at the point of admission it is my clinical judgment that the patient will require inpatient hospital care spanning beyond 2 midnights from the point of admission due to high intensity of service, high risk for further deterioration and high frequency of surveillance required.*    For questions or updates, please contact [place] Please consult [email or web address] for contact info under Cardiology/STEMI.    Signed, Daune Perch, NP  06/01/2017 5:47 PM   As above, patient seen and examined.  Briefly he is an 84 year old male with past medical history of diabetes mellitus, hypertension, hyperlipidemia, chronic stage III kidney disease with non-ST elevation myocardial infarction.  No prior cardiac history.  Last evening the patient developed "indigestion" in the epigastric/substernal area.  There was nausea but no diaphoresis or dyspnea.  The pain did not radiate.  It was not pleuritic.  Lasted 1 hour and resolved.  He awakened at 4:00 this morning with recurrent symptoms that lasted several hours.  He presented to [facility].  His troponin was minimally elevated.  He was transferred here for further management.  Presently pain-free.  Note he did have mild hypotension following nitroglycerin.  Electrocardiogram showed sinus rhythm with first-degree AV block, prior septal infarct cannot be excluded, nonspecific ST changes.  Creatinine 1.31.  Troponin 0 0.23 and 0.50.  There are telemetry strips included that all show atrial fibrillation.  1 non-ST elevation myocardial infarction-patient's symptoms are concerning and troponin mildly elevated.  Will treat with aspirin, heparin, statin and continue beta-blocker.  I have added nitroglycerin paste.  Plan cardiac catheterization tomorrow.  The risks and benefits including myocardial infarction, CVA and death discussed and he agrees to proceed.  We will hydrate prior to the procedure given mild renal insufficiency.  Hold  lisinopril HCT prior to procedure.  Schedule echocardiogram for LV function.   2 question atrial fibrillation-telemetry strips included from outside hospital all show atrial fibrillation.  However patient is in sinus rhythm here and on electrocardiogram at that facility.  Dr. Felton Clinton was contacted by [facility] and apparently there was no atrial fibrillation noted while the patient was in the emergency room there.  This appears to be an error in medical records.  We will follow closely on telemetry but will not treat for atrial fibrillation unless he demonstrates arrhythmia here.  3 hypertension-plan to continue metoprolol.  Continue amlodipine.  Hold lisinopril HCT.  Follow blood pressure and adjust regimen as needed.  4 chronic stage III kidney disease-as outlined above we  will hydrate prior to catheterization.  Follow renal function after procedure.  5 Hyperlipidemia-add statin.  6 back pain-follow-up primary care after discharge.  Kirk Ruths, MD

## 2017-06-01 NOTE — Progress Notes (Signed)
ANTICOAGULATION CONSULT NOTE - Initial Consult  Pharmacy Consult for heparin Indication: chest pain/ACS  Allergies not on file  Patient Measurements: Height: 5\' 8"  (172.7 cm) Weight: 184 lb 14.4 oz (83.9 kg) IBW/kg (Calculated) : 68.4 Heparin Dosing Weight: 83.9 kg  Vital Signs: Temp: 97.5 F (36.4 C) (03/18 1536) Temp Source: Oral (03/18 1536) BP: 168/84 (03/18 1536) Pulse Rate: 65 (03/18 1536)  Labs: No results for input(s): HGB, HCT, PLT, APTT, LABPROT, INR, HEPARINUNFRC, HEPRLOWMOCWT, CREATININE, CKTOTAL, CKMB, TROPONINI in the last 72 hours.  CrCl cannot be calculated (No order found.).   Medical History: Past Medical History:  Diagnosis Date  . Chronic renal insufficiency, stage 2 (mild)   . Diabetes mellitus ([place])   . Hyperlipidemia   . Hypertension     Medications:  Scheduled:  . [START ON 06/02/2017] amLODipine  5 mg Oral Daily  . [START ON 06/02/2017] aspirin EC  81 mg Oral Daily  . atorvastatin  80 mg Oral q1800  . heparin  4,000 Units Intravenous Once  . insulin aspart  0-15 Units Subcutaneous TID WC  . insulin aspart  0-5 Units Subcutaneous QHS  . metoprolol tartrate  12.5 mg Oral BID  . nitroGLYCERIN  1 inch Topical Q6H    Assessment: 88 yom presenting from [facility] with elevated troponin (0.23>0.5) without ST elevation on EKG. No anticoagulation prior to admission. Received aspirin and nitro paste at OSH - confirmed no heparin.   Hgb 14.4, plts 198 at OSH - labs placed for [facility]. No signs/symptoms of bleeding. Plan for possible cath tomorrow per notes.   Goal of Therapy:  Heparin level 0.3-0.7 units/ml Monitor platelets by anticoagulation protocol: Yes   Plan:  Give 4000 units bolus x 1 Start heparin infusion at 1000 units/hr Check anti-Xa level in 8 hours and daily while on heparin Continue to monitor H&H and platelets  Doylene Canard, PharmD Clinical Pharmacist  Pager: [phone] Phone: [phone] 06/01/2017,6:06 PM

## 2017-06-01 NOTE — Progress Notes (Signed)
CRITICAL VALUE ALERT  Critical Value:  Troponin 10.57  Date & Time Notied:  06/01/2017 1942  Provider Notified: Text page; Barrett-Cards Fellow. 06/01/2017 @2019   Orders Received/Actions taken: Call back received at 2024 from Dr. Ahmed Prima, no new orders or change in plan of care at this time. Pt on IV heparin gtt and nitro paste. Pt denies CP. Heart cath planned for 06/02/2017.

## 2017-06-02 ENCOUNTER — Other Ambulatory Visit (HOSPITAL_COMMUNITY): Payer: Medicare Other

## 2017-06-02 ENCOUNTER — Inpatient Hospital Stay (HOSPITAL_COMMUNITY)
Admission: AD | Disposition: A | Discharge status: Home / Self Care | Payer: Self-pay | Source: Other Acute Inpatient Hospital | Attending: Cardiology

## 2017-06-02 ENCOUNTER — Encounter (HOSPITAL_COMMUNITY): Payer: Self-pay | Admitting: General Practice

## 2017-06-02 DIAGNOSIS — N183 Chronic kidney disease, stage 3 (moderate): Secondary | ICD-10-CM

## 2017-06-02 DIAGNOSIS — I10 Essential (primary) hypertension: Secondary | ICD-10-CM

## 2017-06-02 DIAGNOSIS — I2510 Atherosclerotic heart disease of native coronary artery without angina pectoris: Secondary | ICD-10-CM

## 2017-06-02 HISTORY — PX: LEFT HEART CATH AND CORONARY ANGIOGRAPHY: CATH118249

## 2017-06-02 HISTORY — PX: CORONARY STENT INTERVENTION: CATH118234

## 2017-06-02 HISTORY — PX: CORONARY ANGIOPLASTY WITH STENT PLACEMENT: SHX49

## 2017-06-02 HISTORY — PX: CORONARY BALLOON ANGIOPLASTY: CATH118233

## 2017-06-02 LAB — LIPID PANEL
Cholesterol: 171 mg/dL (ref 0–200)
HDL: 34 mg/dL — ABNORMAL LOW (ref >40)
LDL Cholesterol: 116 mg/dL — ABNORMAL HIGH (ref 0–99)
Total CHOL/HDL Ratio: 5 RATIO
Triglycerides: 106 mg/dL (ref <150)
VLDL: 21 mg/dL (ref 0–40)

## 2017-06-02 LAB — POCT ACTIVATED CLOTTING TIME: Activated Clotting Time: 439 seconds

## 2017-06-02 LAB — BASIC METABOLIC PANEL
Anion gap: 15 (ref 5–15)
BUN: 22 mg/dL — ABNORMAL HIGH (ref 6–20)
CO2: 18 mmol/L — ABNORMAL LOW (ref 22–32)
Calcium: 8.5 mg/dL — ABNORMAL LOW (ref 8.9–10.3)
Chloride: 104 mmol/L (ref 101–111)
Creatinine, Ser: 1.55 mg/dL — ABNORMAL HIGH (ref 0.61–1.24)
GFR calc Af Amer: 45 mL/min — ABNORMAL LOW (ref >60)
GFR calc non Af Amer: 39 mL/min — ABNORMAL LOW (ref >60)
Glucose, Bld: 184 mg/dL — ABNORMAL HIGH (ref 65–99)
Potassium: 3.6 mmol/L (ref 3.5–5.1)
Sodium: 137 mmol/L (ref 135–145)

## 2017-06-02 LAB — CBC
HCT: 36 % — ABNORMAL LOW (ref 39.0–52.0)
Hemoglobin: 12.1 g/dL — ABNORMAL LOW (ref 13.0–17.0)
MCH: 29.5 pg (ref 26.0–34.0)
MCHC: 33.6 g/dL (ref 30.0–36.0)
MCV: 87.8 fL (ref 78.0–100.0)
Platelets: 179 10*3/uL (ref 150–400)
RBC: 4.1 MIL/uL — ABNORMAL LOW (ref 4.22–5.81)
RDW: 13.3 % (ref 11.5–15.5)
WBC: 11.1 10*3/uL — ABNORMAL HIGH (ref 4.0–10.5)

## 2017-06-02 LAB — GLUCOSE, CAPILLARY
Glucose-Capillary: 198 mg/dL — ABNORMAL HIGH (ref 65–99)
Glucose-Capillary: 131 mg/dL — ABNORMAL HIGH (ref 65–99)
Glucose-Capillary: 156 mg/dL — ABNORMAL HIGH (ref 65–99)
Glucose-Capillary: 151 mg/dL — ABNORMAL HIGH (ref 65–99)

## 2017-06-02 LAB — PROTIME-INR
INR: 1.14
Prothrombin Time: 14.5 seconds (ref 11.4–15.2)

## 2017-06-02 LAB — HEPARIN LEVEL (UNFRACTIONATED)
Heparin Unfractionated: 0.46 IU/mL (ref 0.30–0.70)
Heparin Unfractionated: 0.44 IU/mL (ref 0.30–0.70)

## 2017-06-02 LAB — TROPONIN I
Troponin I: 14.67 ng/mL (ref <0.03)
Troponin I: 17.18 ng/mL (ref <0.03)
Troponin I: 15.13 ng/mL (ref <0.03)

## 2017-06-02 SURGERY — LEFT HEART CATH AND CORONARY ANGIOGRAPHY
Anesthesia: LOCAL

## 2017-06-02 MED ORDER — HEPARIN SODIUM (PORCINE) 1000 UNIT/ML IJ SOLN
INTRAMUSCULAR | Status: DC | PRN
  Administered 2017-06-02: 4500 [IU] via INTRAVENOUS

## 2017-06-02 MED ORDER — FENTANYL CITRATE (PF) 100 MCG/2ML IJ SOLN
INTRAMUSCULAR | Status: AC
  Filled 2017-06-02: qty 2

## 2017-06-02 MED ORDER — SODIUM CHLORIDE 0.9 % IV SOLN
INTRAVENOUS | Status: DC
  Administered 2017-06-02: 18:00:00 via INTRAVENOUS

## 2017-06-02 MED ORDER — HEPARIN (PORCINE) IN NACL 2-0.9 UNIT/ML-% IJ SOLN
INTRAMUSCULAR | Status: AC
  Filled 2017-06-02: qty 1000

## 2017-06-02 MED ORDER — TICAGRELOR 90 MG PO TABS
ORAL_TABLET | ORAL | Status: AC
  Filled 2017-06-02: qty 2

## 2017-06-02 MED ORDER — FENTANYL CITRATE (PF) 100 MCG/2ML IJ SOLN
INTRAMUSCULAR | Status: DC | PRN
  Administered 2017-06-02 (×2): 25 ug via INTRAVENOUS

## 2017-06-02 MED ORDER — VERAPAMIL HCL 2.5 MG/ML IV SOLN
INTRAVENOUS | Status: DC | PRN
  Administered 2017-06-02: 11:00:00 via INTRA_ARTERIAL

## 2017-06-02 MED ORDER — BIVALIRUDIN BOLUS VIA INFUSION - CUPID
INTRAVENOUS | Status: DC | PRN
  Administered 2017-06-02: 63.375 mg via INTRAVENOUS

## 2017-06-02 MED ORDER — DIAZEPAM 5 MG PO TABS: 5.0000 mg | ORAL_TABLET | Freq: Four times a day (QID) | ORAL | Status: DC | PRN

## 2017-06-02 MED ORDER — ATORVASTATIN CALCIUM 80 MG PO TABS: 80.0000 mg | ORAL_TABLET | Freq: Every day | ORAL | Status: DC

## 2017-06-02 MED ORDER — TICAGRELOR 90 MG PO TABS
90.0000 mg | ORAL_TABLET | Freq: Two times a day (BID) | ORAL | Status: DC
  Administered 2017-06-02 – 2017-06-03 (×2): 90 mg via ORAL
  Filled 2017-06-02 (×2): qty 1

## 2017-06-02 MED ORDER — BIVALIRUDIN TRIFLUOROACETATE 250 MG IV SOLR
INTRAVENOUS | Status: AC
  Filled 2017-06-02: qty 250

## 2017-06-02 MED ORDER — SODIUM CHLORIDE 0.9% FLUSH: 3.0000 mL | INTRAVENOUS | Status: DC | PRN

## 2017-06-02 MED ORDER — LIDOCAINE HCL (PF) 1 % IJ SOLN
INTRAMUSCULAR | Status: DC | PRN
  Administered 2017-06-02: 2 mL

## 2017-06-02 MED ORDER — BIVALIRUDIN TRIFLUOROACETATE 250 MG IV SOLR
INTRAVENOUS | Status: AC | PRN
  Administered 2017-06-02 (×2): 1.75 mg/kg/h via INTRAVENOUS

## 2017-06-02 MED ORDER — ANGIOPLASTY BOOK
Freq: Once | Status: AC
  Administered 2017-06-02: 1
  Filled 2017-06-02: qty 1

## 2017-06-02 MED ORDER — IOPAMIDOL (ISOVUE-370) INJECTION 76%
INTRAVENOUS | Status: AC
  Filled 2017-06-02: qty 125

## 2017-06-02 MED ORDER — SODIUM CHLORIDE 0.9% FLUSH
3.0000 mL | Freq: Two times a day (BID) | INTRAVENOUS | Status: DC
  Administered 2017-06-02 – 2017-06-03 (×2): 3 mL via INTRAVENOUS

## 2017-06-02 MED ORDER — LABETALOL HCL 5 MG/ML IV SOLN: 10.0000 mg | INTRAVENOUS | Status: AC | PRN

## 2017-06-02 MED ORDER — MIDAZOLAM HCL 2 MG/2ML IJ SOLN
INTRAMUSCULAR | Status: DC | PRN
  Administered 2017-06-02 (×2): 1 mg via INTRAVENOUS

## 2017-06-02 MED ORDER — NITROGLYCERIN 1 MG/10 ML FOR IR/CATH LAB
INTRA_ARTERIAL | Status: DC | PRN
  Administered 2017-06-02 (×4): 200 ug via INTRACORONARY

## 2017-06-02 MED ORDER — VERAPAMIL HCL 2.5 MG/ML IV SOLN
INTRAVENOUS | Status: AC
  Filled 2017-06-02: qty 2

## 2017-06-02 MED ORDER — HEPARIN SODIUM (PORCINE) 1000 UNIT/ML IJ SOLN
INTRAMUSCULAR | Status: AC
  Filled 2017-06-02: qty 1

## 2017-06-02 MED ORDER — ASPIRIN 81 MG PO CHEW: 81.0000 mg | CHEWABLE_TABLET | Freq: Every day | ORAL | Status: DC

## 2017-06-02 MED ORDER — MIDAZOLAM HCL 2 MG/2ML IJ SOLN
INTRAMUSCULAR | Status: AC
  Filled 2017-06-02: qty 2

## 2017-06-02 MED ORDER — IOPAMIDOL (ISOVUE-370) INJECTION 76%
INTRAVENOUS | Status: AC
  Filled 2017-06-02: qty 100

## 2017-06-02 MED ORDER — NITROGLYCERIN 1 MG/10 ML FOR IR/CATH LAB
INTRA_ARTERIAL | Status: AC
  Filled 2017-06-02: qty 10

## 2017-06-02 MED ORDER — ACETAMINOPHEN 325 MG PO TABS: 650.0000 mg | ORAL_TABLET | ORAL | Status: DC | PRN

## 2017-06-02 MED ORDER — IOPAMIDOL (ISOVUE-370) INJECTION 76%
INTRAVENOUS | Status: AC
  Filled 2017-06-02: qty 50

## 2017-06-02 MED ORDER — SODIUM CHLORIDE 0.9 % IV SOLN: 250.0000 mL | INTRAVENOUS | Status: DC | PRN

## 2017-06-02 MED ORDER — ONDANSETRON HCL 4 MG/2ML IJ SOLN: 4.0000 mg | Freq: Four times a day (QID) | INTRAMUSCULAR | Status: DC | PRN

## 2017-06-02 MED ORDER — TICAGRELOR 90 MG PO TABS
ORAL_TABLET | ORAL | Status: DC | PRN
  Administered 2017-06-02: 180 mg via ORAL

## 2017-06-02 MED ORDER — IOPAMIDOL (ISOVUE-370) INJECTION 76%
INTRAVENOUS | Status: DC | PRN
  Administered 2017-06-02: 225 mL via INTRA_ARTERIAL

## 2017-06-02 MED ORDER — HEPARIN (PORCINE) IN NACL 2-0.9 UNIT/ML-% IJ SOLN
INTRAMUSCULAR | Status: AC | PRN
  Administered 2017-06-02 (×2): 500 mL via INTRA_ARTERIAL

## 2017-06-02 MED ORDER — HYDRALAZINE HCL 20 MG/ML IJ SOLN: 5.0000 mg | INTRAMUSCULAR | Status: AC | PRN

## 2017-06-02 MED ORDER — HEART ATTACK BOUNCING BOOK
Freq: Once | Status: AC
  Administered 2017-06-02: 1
  Filled 2017-06-02: qty 1

## 2017-06-02 MED ORDER — ACTIVE PARTNERSHIP FOR HEALTH OF YOUR HEART BOOK
Freq: Once | Status: AC
  Administered 2017-06-02: 21:00:00 1
  Filled 2017-06-02: qty 1

## 2017-06-02 MED ORDER — LIDOCAINE HCL (PF) 1 % IJ SOLN
INTRAMUSCULAR | Status: AC
  Filled 2017-06-02: qty 30

## 2017-06-02 SURGICAL SUPPLY — 22 items
BALLN SAPPHIRE 2.0X12 (BALLOONS) ×2
BALLN ~~LOC~~ EMERGE MR 2.75X12 (BALLOONS) ×2
BALLN ~~LOC~~ EMERGE MR 3.25X15 (BALLOONS) ×2
BALLOON SAPPHIRE 2.0X12 (BALLOONS) ×1 IMPLANT
BALLOON ~~LOC~~ EMERGE MR 2.75X12 (BALLOONS) ×1 IMPLANT
BALLOON ~~LOC~~ EMERGE MR 3.25X15 (BALLOONS) ×1 IMPLANT
BAND ZEPHYR COMPRESS 30 LONG (HEMOSTASIS) ×2 IMPLANT
CATH INFINITI 5FR ANG PIGTAIL (CATHETERS) ×2 IMPLANT
CATH LAUNCHER 6FR JR4 (CATHETERS) ×2 IMPLANT
CATH OPTITORQUE TIG 4.0 5F (CATHETERS) ×2 IMPLANT
GUIDEWIRE INQWIRE 1.5J.035X260 (WIRE) ×1 IMPLANT
INQWIRE 1.5J .035X260CM (WIRE) ×2
KIT ENCORE 26 ADVANTAGE (KITS) ×2 IMPLANT
KIT HEART LEFT (KITS) ×2 IMPLANT
NEEDLE PERC 21GX4CM (NEEDLE) ×2 IMPLANT
PACK CARDIAC CATHETERIZATION (CUSTOM PROCEDURE TRAY) ×2 IMPLANT
SHEATH RAIN RADIAL 21G 6FR (SHEATH) ×2 IMPLANT
STENT RESOLUTE ONYX 2.5X15 (Permanent Stent) ×2 IMPLANT
STENT RESOLUTE ONYX 3.0X26 (Permanent Stent) ×2 IMPLANT
TRANSDUCER W/STOPCOCK (MISCELLANEOUS) ×2 IMPLANT
TUBING CIL FLEX 10 FLL-RA (TUBING) ×2 IMPLANT
WIRE MARVEL STR TIP 190CM (WIRE) ×2 IMPLANT

## 2017-06-02 NOTE — Progress Notes (Signed)
Progress Note  Patient Name: Erik Logan Date of Encounter: 06/02/2017  Primary Cardiologist: Kirk Ruths, MD   Subjective   No chest pain; mild dyspnea last PM now resolved.  Inpatient Medications    Scheduled Meds: . amLODipine  5 mg Oral Daily  . aspirin EC  81 mg Oral Daily  . atorvastatin  80 mg Oral q1800  . insulin aspart  0-15 Units Subcutaneous TID WC  . insulin aspart  0-5 Units Subcutaneous QHS  . metoprolol tartrate  12.5 mg Oral BID  . nitroGLYCERIN  1 inch Topical Q6H  . sodium chloride flush  3 mL Intravenous Q12H   Continuous Infusions: . sodium chloride    . sodium chloride 1 mL/kg/hr (06/01/17 2044)  . heparin 1,000 Units/hr (06/01/17 1820)   PRN Meds: sodium chloride, acetaminophen, nitroGLYCERIN, ondansetron (ZOFRAN) IV, sodium chloride flush   Vital Signs    Vitals:   06/01/17 1700 06/01/17 1821 06/01/17 2134 06/02/17 0628  BP:  (!) 165/80 122/68 (!) 153/66  Pulse:   63 (!) 58  Resp:   20   Temp:   99 F (37.2 C) 98 F (36.7 C)  TempSrc:   Oral Oral  SpO2:   97% 100%  Weight: 184 lb 14.4 oz (83.9 kg)   186 lb 4.8 oz (84.5 kg)  Height: 5\' 8"  (1.727 m)       Intake/Output Summary (Last 24 hours) at 06/02/2017 0808 Last data filed at 06/02/2017 0400 Gross per 24 hour  Intake 946.34 ml  Output -  Net 946.34 ml   Filed Weights   06/01/17 1700 06/02/17 0628  Weight: 184 lb 14.4 oz (83.9 kg) 186 lb 4.8 oz (84.5 kg)    Telemetry    Sinus bradycardia - Personally Reviewed  Physical Exam   GEN: No acute distress.   Neck: No JVD Cardiac: RRR, no murmurs, rubs, or gallops.  Respiratory: Milldy diminished BS bases GI: Soft, nontender, non-distended  MS: No edema Neuro:  Nonfocal  Psych: Normal affect   Labs    Chemistry Recent Labs  Lab 06/02/17 0542  NA 137  K 3.6  CL 104  CO2 18*  GLUCOSE 184*  BUN 22*  CREATININE 1.55*  CALCIUM 8.5*  GFRNONAA 39*  GFRAA 45*  ANIONGAP 15     Hematology Recent Labs  Lab  06/01/17 1854 06/02/17 0542  WBC 12.7* 11.1*  RBC 4.89 4.10*  HGB 14.7 12.1*  HCT 42.5 36.0*  MCV 86.9 87.8  MCH 30.1 29.5  MCHC 34.6 33.6  RDW 13.4 13.3  PLT 209 179    Cardiac Enzymes Recent Labs  Lab 06/01/17 1741 06/01/17 2224 06/02/17 0542  TROPONINI 10.57* 14.67* 17.18*    Patient Profile     84 y.o. male with past medical history of diabetes mellitus, hypertension, hyperlipidemia, chronic stage III kidney disease admitted with non-ST elevation myocardial infarction.  Assessment & Plan    1 non-ST elevation myocardial infarction-patient has ruled in for a non-ST elevation myocardial infarction.  Continue aspirin, beta-blocker, statin and heparin.  Continue Nitropaste.  He is pain-free.  Proceed with cardiac catheterization today.  The risks and benefits including myocardial infarction, CVA, death and worsening renal function discussed and patient agrees to proceed.  Continue hydration prior to catheterization.  Limit dye.  Will arrange echocardiogram to assess LV function.  Also note ACE inhibitor and diuretic on hold prior to procedure.  2 question atrial fibrillation-as outlined previously, telemetry strips from outside emergency room show atrial fibrillation  but electrocardiogram showed sinus rhythm.  Follow-up ECG and telemetry here also shows sinus rhythm.  We discussed the patient with Dr. Felton Clinton (ER physician at [facility]) and he did not note atrial fibrillation while patient was in the emergency room there.  Outside strips may have been on wrong patient. Continue to follow telemetry here.  3 hypertension-plan to continue metoprolol. Increase amlodipine to 10 mg daily and follow.  4 chronic stage III kidney disease-plan to continue hydration prior to cardiac catheterization.  Limit dye.  Follow renal function after procedure.  5 Hyperlipidemia-continue statin; lipids and liver 4 weeks.  6 back pain-follow-will need evaluation following DC with primary  care.  For questions or updates, please contact [place] Please consult [email or web address] for contact info under Cardiology/STEMI.      Signed, Kirk Ruths, MD  06/02/2017, 8:08 AM

## 2017-06-02 NOTE — Interval H&P Note (Signed)
Cath Lab Visit (complete for each Cath Lab visit)  Clinical Evaluation Leading to the Procedure:   ACS: Yes.    Non-ACS:    Anginal Classification: CCS IV  Anti-ischemic medical therapy: Maximal Therapy (2 or more classes of medications)  Non-Invasive Test Results: No non-invasive testing performed  Prior CABG: No previous CABG      History and Physical Interval Note:  06/02/2017 10:29 AM  Erik Logan  has presented today for surgery, with the diagnosis of n stemi  The various methods of treatment have been discussed with the patient and family. After consideration of risks, benefits and other options for treatment, the patient has consented to  Procedure(s): LEFT HEART CATH AND CORONARY ANGIOGRAPHY (N/A) as a surgical intervention .  The patient's history has been reviewed, patient examined, no change in status, stable for surgery.  I have reviewed the patient's chart and labs.  Questions were answered to the patient's satisfaction.     Shelva Majestic

## 2017-06-02 NOTE — H&P (View-Only) (Signed)
Progress Note  Patient Name: Erik Logan Date of Encounter: 06/02/2017  Primary Cardiologist: Kirk Ruths, MD   Subjective   No chest pain; mild dyspnea last PM now resolved.  Inpatient Medications    Scheduled Meds: . amLODipine  5 mg Oral Daily  . aspirin EC  81 mg Oral Daily  . atorvastatin  80 mg Oral q1800  . insulin aspart  0-15 Units Subcutaneous TID WC  . insulin aspart  0-5 Units Subcutaneous QHS  . metoprolol tartrate  12.5 mg Oral BID  . nitroGLYCERIN  1 inch Topical Q6H  . sodium chloride flush  3 mL Intravenous Q12H   Continuous Infusions: . sodium chloride    . sodium chloride 1 mL/kg/hr (06/01/17 2044)  . heparin 1,000 Units/hr (06/01/17 1820)   PRN Meds: sodium chloride, acetaminophen, nitroGLYCERIN, ondansetron (ZOFRAN) IV, sodium chloride flush   Vital Signs    Vitals:   06/01/17 1700 06/01/17 1821 06/01/17 2134 06/02/17 0628  BP:  (!) 165/80 122/68 (!) 153/66  Pulse:   63 (!) 58  Resp:   20   Temp:   99 F (37.2 C) 98 F (36.7 C)  TempSrc:   Oral Oral  SpO2:   97% 100%  Weight: 184 lb 14.4 oz (83.9 kg)   186 lb 4.8 oz (84.5 kg)  Height: 5\' 8"  (1.727 m)       Intake/Output Summary (Last 24 hours) at 06/02/2017 0808 Last data filed at 06/02/2017 0400 Gross per 24 hour  Intake 946.34 ml  Output -  Net 946.34 ml   Filed Weights   06/01/17 1700 06/02/17 0628  Weight: 184 lb 14.4 oz (83.9 kg) 186 lb 4.8 oz (84.5 kg)    Telemetry    Sinus bradycardia - Personally Reviewed  Physical Exam   GEN: No acute distress.   Neck: No JVD Cardiac: RRR, no murmurs, rubs, or gallops.  Respiratory: Milldy diminished BS bases GI: Soft, nontender, non-distended  MS: No edema Neuro:  Nonfocal  Psych: Normal affect   Labs    Chemistry Recent Labs  Lab 06/02/17 0542  NA 137  K 3.6  CL 104  CO2 18*  GLUCOSE 184*  BUN 22*  CREATININE 1.55*  CALCIUM 8.5*  GFRNONAA 39*  GFRAA 45*  ANIONGAP 15     Hematology Recent Labs  Lab  06/01/17 1854 06/02/17 0542  WBC 12.7* 11.1*  RBC 4.89 4.10*  HGB 14.7 12.1*  HCT 42.5 36.0*  MCV 86.9 87.8  MCH 30.1 29.5  MCHC 34.6 33.6  RDW 13.4 13.3  PLT 209 179    Cardiac Enzymes Recent Labs  Lab 06/01/17 1741 06/01/17 2224 06/02/17 0542  TROPONINI 10.57* 14.67* 17.18*    Patient Profile     84 y.o. male with past medical history of diabetes mellitus, hypertension, hyperlipidemia, chronic stage III kidney disease admitted with non-ST elevation myocardial infarction.  Assessment & Plan    1 non-ST elevation myocardial infarction-patient has ruled in for a non-ST elevation myocardial infarction.  Continue aspirin, beta-blocker, statin and heparin.  Continue Nitropaste.  He is pain-free.  Proceed with cardiac catheterization today.  The risks and benefits including myocardial infarction, CVA, death and worsening renal function discussed and patient agrees to proceed.  Continue hydration prior to catheterization.  Limit dye.  Will arrange echocardiogram to assess LV function.  Also note ACE inhibitor and diuretic on hold prior to procedure.  2 question atrial fibrillation-as outlined previously, telemetry strips from outside emergency room show atrial fibrillation  but electrocardiogram showed sinus rhythm.  Follow-up ECG and telemetry here also shows sinus rhythm.  We discussed the patient with Dr. Felton Clinton (ER physician at [facility]) and he did not note atrial fibrillation while patient was in the emergency room there.  Outside strips may have been on wrong patient. Continue to follow telemetry here.  3 hypertension-plan to continue metoprolol. Increase amlodipine to 10 mg daily and follow.  4 chronic stage III kidney disease-plan to continue hydration prior to cardiac catheterization.  Limit dye.  Follow renal function after procedure.  5 Hyperlipidemia-continue statin; lipids and liver 4 weeks.  6 back pain-follow-will need evaluation following DC with primary  care.  For questions or updates, please contact [place] Please consult [email or web address] for contact info under Cardiology/STEMI.      Signed, Kirk Ruths, MD  06/02/2017, 8:08 AM

## 2017-06-02 NOTE — Care Management Note (Addendum)
Case Management Note  Patient Details  Name: Erik Logan MRN: [id number] Date of Birth: 27-Aug-1928  Subjective/Objective:  Pt presented for [facility]stem[facility]- post cardiac cath. Plan for home on Brilinta. Benefits Check completed and CM will provide pt with 30 day free card.n Pt uses [employer] and medication can be ordered.  No home needs identified at this time.                   Action/Plan: S/W JAY @ [employer] RX # [phone]    BRILINTA 90 MG BID  COVER-YES  CO-PAY- $ 128.71  TIER- 4 DRUG  PRIOR APPROVAL- NO   PREFERRED PHARMACY : [facility]   Expected Discharge Date:                  Expected Discharge Plan:  Home/Self Care  In-House Referral:  NA  Discharge planning Services  CM Consult, Medication Assistance  Post Acute Care Choice:  NA Choice offered to:  NA  DME Arranged:  N/A DME Agency:  NA  HH Arranged:  NA HH Agency:  NA  Status of Service:  Completed, signed off  If discussed at [place] of Stay Meetings, dates discussed:    Additional Comments:  Bethena Roys, RN 06/02/2017, 4:14 PM

## 2017-06-02 NOTE — Progress Notes (Signed)
ANTICOAGULATION CONSULT NOTE   Pharmacy Consult for Heparin Indication: chest pain/ACS  Patient Measurements: Height: 5\' 8"  (172.7 cm) Weight: 184 lb 14.4 oz (83.9 kg) IBW/kg (Calculated) : 68.4 Heparin Dosing Weight: 83.9 kg  Vital Signs: Temp: 99 F (37.2 C) (03/18 2134) Temp Source: Oral (03/18 2134) BP: 122/68 (03/18 2134) Pulse Rate: 63 (03/18 2134)  Labs: Recent Labs    06/01/17 1741 06/01/17 1854 06/01/17 2224 06/02/17 0211  HGB  --  14.7  --   --   HCT  --  42.5  --   --   PLT  --  209  --   --   HEPARINUNFRC  --   --   --  0.46  TROPONINI 10.57*  --  14.67*  --     Medical History: Past Medical History:  Diagnosis Date  . Chronic renal insufficiency, stage 2 (mild)   . Diabetes mellitus ([place])   . Hyperlipidemia   . Hypertension    Assessment: 9 yom presenting from [facility] with elevated troponin (0.23>0.5) without ST elevation on EKG. No anticoagulation prior to admission. Received aspirin and nitro paste at OSH - confirmed no heparin.   3/19 AM: initial heparin level is therapeutic, CBC good, troponin elevated   Goal of Therapy:  Heparin level 0.3-0.7 units/ml Monitor platelets by anticoagulation protocol: Yes   Plan:  Cont heparin at 1000 units/hr [place], PharmD, BCPS Clinical Pharmacist Phone: [phone]

## 2017-06-02 NOTE — Progress Notes (Signed)
[facility] BAND REMOVAL  LOCATION:    right radial  DEFLATED PER PROTOCOL:    Yes.    TIME BAND OFF / DRESSING APPLIED:    1845   SITE UPON ARRIVAL:    Level 1(BRUISE)  SITE AFTER BAND REMOVAL:    Level 1(BRUISE)  CIRCULATION SENSATION AND MOVEMENT:    Within Normal Limits   Yes.    COMMENTS:   Tolerated procedure  well

## 2017-06-02 NOTE — Progress Notes (Signed)
  Echocardiogram 2D Echocardiogram was attempted but patient was in the Cath Lab. We will try again tomorrow.   Jennette Dubin 06/02/2017, 1:27 PM

## 2017-06-02 NOTE — Plan of Care (Signed)
  Cardiac: Ability to achieve and maintain adequate cardiopulmonary perfusion will improve 06/02/2017 0006 - Progressing by Jacqlyn Larsen, RN Note Trop resulted at 10.57. Heart cath planned for 06/02/17. Pt denies CP, nitro paste in place. Will continue with current plan of care as ordered.

## 2017-06-03 ENCOUNTER — Encounter (HOSPITAL_COMMUNITY): Payer: Self-pay | Admitting: Cardiovascular Disease

## 2017-06-03 ENCOUNTER — Inpatient Hospital Stay (HOSPITAL_COMMUNITY): Payer: Medicare Other

## 2017-06-03 ENCOUNTER — Other Ambulatory Visit: Payer: Self-pay | Admitting: Physician Assistant

## 2017-06-03 DIAGNOSIS — N183 Chronic kidney disease, stage 3 unspecified: Secondary | ICD-10-CM

## 2017-06-03 DIAGNOSIS — I10 Essential (primary) hypertension: Secondary | ICD-10-CM

## 2017-06-03 DIAGNOSIS — I214 Non-ST elevation (NSTEMI) myocardial infarction: Secondary | ICD-10-CM

## 2017-06-03 LAB — BASIC METABOLIC PANEL
Anion gap: 8 (ref 5–15)
BUN: 19 mg/dL (ref 6–20)
CO2: 22 mmol/L (ref 22–32)
Calcium: 8.2 mg/dL — ABNORMAL LOW (ref 8.9–10.3)
Chloride: 106 mmol/L (ref 101–111)
Creatinine, Ser: 1.44 mg/dL — ABNORMAL HIGH (ref 0.61–1.24)
GFR calc Af Amer: 49 mL/min — ABNORMAL LOW (ref >60)
GFR calc non Af Amer: 42 mL/min — ABNORMAL LOW (ref >60)
Glucose, Bld: 159 mg/dL — ABNORMAL HIGH (ref 65–99)
Potassium: 3.3 mmol/L — ABNORMAL LOW (ref 3.5–5.1)
Sodium: 136 mmol/L (ref 135–145)

## 2017-06-03 LAB — GLUCOSE, CAPILLARY
Glucose-Capillary: 152 mg/dL — ABNORMAL HIGH (ref 65–99)
Glucose-Capillary: 239 mg/dL — ABNORMAL HIGH (ref 65–99)

## 2017-06-03 LAB — CBC
HCT: 35.5 % — ABNORMAL LOW (ref 39.0–52.0)
Hemoglobin: 12.1 g/dL — ABNORMAL LOW (ref 13.0–17.0)
MCH: 29.2 pg (ref 26.0–34.0)
MCHC: 34.1 g/dL (ref 30.0–36.0)
MCV: 85.7 fL (ref 78.0–100.0)
Platelets: 172 10*3/uL (ref 150–400)
RBC: 4.14 MIL/uL — ABNORMAL LOW (ref 4.22–5.81)
RDW: 13 % (ref 11.5–15.5)
WBC: 11 10*3/uL — ABNORMAL HIGH (ref 4.0–10.5)

## 2017-06-03 LAB — ECHOCARDIOGRAM COMPLETE
Height: 68 in
Weight: 2998.26 oz

## 2017-06-03 MED ORDER — NITROGLYCERIN 0.4 MG SL SUBL: 0.4000 mg | SUBLINGUAL_TABLET | SUBLINGUAL | 12 refills | Status: DC | PRN

## 2017-06-03 MED ORDER — HYDROCHLOROTHIAZIDE 12.5 MG PO CAPS
12.5000 mg | ORAL_CAPSULE | Freq: Every day | ORAL | Status: DC
  Administered 2017-06-03: 12.5 mg via ORAL
  Filled 2017-06-03: qty 1

## 2017-06-03 MED ORDER — ASPIRIN EC 81 MG PO TBEC: 81.0000 mg | DELAYED_RELEASE_TABLET | Freq: Every day | ORAL | Status: AC

## 2017-06-03 MED ORDER — HYDROCHLOROTHIAZIDE 12.5 MG PO CAPS: 12.5000 mg | ORAL_CAPSULE | Freq: Every day | ORAL | 6 refills | Status: DC

## 2017-06-03 MED ORDER — METOPROLOL TARTRATE 25 MG PO TABS: 12.5000 mg | ORAL_TABLET | Freq: Every day | ORAL | 6 refills | Status: AC

## 2017-06-03 MED ORDER — POTASSIUM CHLORIDE CRYS ER 20 MEQ PO TBCR
40.0000 meq | EXTENDED_RELEASE_TABLET | Freq: Once | ORAL | Status: AC
  Administered 2017-06-03: 40 meq via ORAL
  Filled 2017-06-03: qty 2

## 2017-06-03 MED ORDER — ISOSORBIDE MONONITRATE ER 30 MG PO TB24: 30.0000 mg | ORAL_TABLET | Freq: Every day | ORAL | 6 refills | Status: DC

## 2017-06-03 MED ORDER — ISOSORBIDE MONONITRATE ER 30 MG PO TB24
30.0000 mg | ORAL_TABLET | Freq: Every day | ORAL | Status: DC
  Administered 2017-06-03: 10:00:00 30 mg via ORAL
  Filled 2017-06-03: qty 1

## 2017-06-03 MED ORDER — ATORVASTATIN CALCIUM 80 MG PO TABS: 80.0000 mg | ORAL_TABLET | Freq: Every day | ORAL | 6 refills | Status: DC

## 2017-06-03 MED ORDER — ASPIRIN 81 MG PO TBEC: 81.0000 mg | DELAYED_RELEASE_TABLET | Freq: Every day | ORAL | Status: DC

## 2017-06-03 MED ORDER — LISINOPRIL 10 MG PO TABS: 10.0000 mg | ORAL_TABLET | Freq: Every day | ORAL | 6 refills | Status: DC

## 2017-06-03 MED ORDER — TICAGRELOR 90 MG PO TABS: 90.0000 mg | ORAL_TABLET | Freq: Two times a day (BID) | ORAL | 11 refills | Status: DC

## 2017-06-03 MED ORDER — LISINOPRIL 10 MG PO TABS
10.0000 mg | ORAL_TABLET | Freq: Every day | ORAL | Status: DC
  Administered 2017-06-03: 10 mg via ORAL
  Filled 2017-06-03: qty 1

## 2017-06-03 MED FILL — Nitroglycerin IV Soln 100 MCG/ML in D5W: INTRA_ARTERIAL | Qty: 10 | Status: AC

## 2017-06-03 MED FILL — Heparin Sodium (Porcine) 2 Unit/ML in Sodium Chloride 0.9%: INTRAMUSCULAR | Qty: 1000 | Status: AC

## 2017-06-03 NOTE — Progress Notes (Signed)
CARDIAC REHAB PHASE I   PRE:  Rate/Rhythm: 66 SR  BP:  Supine: 155/74  Sitting:   Standing:    SaO2: 95%RA  MODE:  Ambulation: 500 ft   POST:  Rate/Rhythm: 82 SR  BP:  Supine:   Sitting: 174/70  Standing:    SaO2: 95%RA 0910-1010 Pt walked 500 ft with asst x 1 with steady gait. No CP. MI education completed with pt and wife who voiced understanding. Reviewed importance of brilinta with stent. Has seen case manager re brilinta. Reviewed NTG use, risk factors, ex ed and counting carbs and heart healthy food choices. Discussed CRP 2 and will refer to [place].   Graylon Good, RN BSN  06/03/2017 10:03 AM

## 2017-06-03 NOTE — Research (Signed)
AE[employer] II Research study protocol and Informed consent reviewed with patient and family. Patient appreciated the offer but declined to participate. He states he felt like he had too much on his plate right now.

## 2017-06-03 NOTE — Progress Notes (Addendum)
Progress Note  Patient Name: Erik Logan Date of Encounter: 06/03/2017  Primary Cardiologist: Kirk Ruths, MD   Subjective   Pt denies CP; mild intermittent dyspnea  Inpatient Medications    Scheduled Meds: . amLODipine  5 mg Oral Daily  . aspirin EC  81 mg Oral Daily  . atorvastatin  80 mg Oral q1800  . insulin aspart  0-15 Units Subcutaneous TID WC  . insulin aspart  0-5 Units Subcutaneous QHS  . metoprolol tartrate  12.5 mg Oral BID  . nitroGLYCERIN  1 inch Topical Q6H  . sodium chloride flush  3 mL Intravenous Q12H  . ticagrelor  90 mg Oral BID   Continuous Infusions: . sodium chloride 100 mL/hr at 06/02/17 2000  . sodium chloride     PRN Meds: sodium chloride, acetaminophen, diazepam, nitroGLYCERIN, ondansetron (ZOFRAN) IV, sodium chloride flush   Vital Signs    Vitals:   06/02/17 2000 06/02/17 2005 06/03/17 0341 06/03/17 0700  BP:  (!) 155/64 (!) 132/46 (!) 135/40  Pulse: (!) 59 64 60 68  Resp: (!) 23 17 16 19   Temp:  98.5 F (36.9 C) 98.1 F (36.7 C) (!) 97.2 F (36.2 C)  TempSrc:  Oral Oral Oral  SpO2: 96% 95% 97% 95%  Weight:   187 lb 6.3 oz (85 kg)   Height:        Intake/Output Summary (Last 24 hours) at 06/03/2017 0824 Last data filed at 06/03/2017 0341 Gross per 24 hour  Intake 1449.58 ml  Output 1775 ml  Net -325.42 ml   Filed Weights   06/01/17 1700 06/02/17 0628 06/03/17 0341  Weight: 184 lb 14.4 oz (83.9 kg) 186 lb 4.8 oz (84.5 kg) 187 lb 6.3 oz (85 kg)    Telemetry    Sinus bradycardia - Personally Reviewed  Physical Exam   GEN: WD WN No acute distress.   Neck: Supple Cardiac: RRR Respiratory: CTA GI: Soft, nontender, non-distended, no masses MS: No edema; radial cath site with no hematoma Neuro:  Grossly intact   Labs    Chemistry Recent Labs  Lab 06/02/17 0542 06/03/17 0336  NA 137 136  K 3.6 3.3*  CL 104 106  CO2 18* 22  GLUCOSE 184* 159*  BUN 22* 19  CREATININE 1.55* 1.44*  CALCIUM 8.5* 8.2*  GFRNONAA  39* 42*  GFRAA 45* 49*  ANIONGAP 15 8     Hematology Recent Labs  Lab 06/01/17 1854 06/02/17 0542 06/03/17 0336  WBC 12.7* 11.1* 11.0*  RBC 4.89 4.10* 4.14*  HGB 14.7 12.1* 12.1*  HCT 42.5 36.0* 35.5*  MCV 86.9 87.8 85.7  MCH 30.1 29.5 29.2  MCHC 34.6 33.6 34.1  RDW 13.4 13.3 13.0  PLT 209 179 172    Cardiac Enzymes Recent Labs  Lab 06/01/17 1741 06/01/17 2224 06/02/17 0542 06/02/17 1416  TROPONINI 10.57* 14.67* 17.18* 15.13*    Patient Profile     84 y.o. male with past medical history of diabetes mellitus, hypertension, hyperlipidemia, chronic stage III kidney disease admitted with non-ST elevation myocardial infarction.  Assessment & Plan    1 non-ST elevation myocardial infarction-Now s/p PCI of RCA and PDA; continue ASA, brilinta, statin and metoprolol; plan medical therapy for residual CAD. Change nitroglycerin paste to imdur. Await echocardiogram to assess LV function.    2 question atrial fibrillation-no evidence on telemetry.  3 hypertension-BP mildly elevated; will resume lisinopril 10 mg daily and HCTZ 12.5 mg daily; check BMET one week following DC.  4 chronic  stage III kidney disease-renal function stable; recheck bmet one week.  5 Hyperlipidemia-continue lipitor; check lipids and liver 4 weeks.  6 back pain-follow-will need evaluation following DC with primary care.  7 Hypokalemia-supplement.  DC today and fu in [place] 2-4 weeks > 30 min PA and physician time D2  For questions or updates, please contact [place] Please consult [email or web address] for contact info under Cardiology/STEMI.      Signed, Kirk Ruths, MD  06/03/2017, 8:24 AM

## 2017-06-03 NOTE — Discharge Summary (Signed)
Discharge Summary    Patient ID: Erik Logan,  MRN: [id number], DOB/AGE: 22-Apr-1928 84 y.o.  Admit date: 06/01/2017 Discharge date: 06/03/2017   Primary Care Provider: Stoney Bang A Primary Cardiologist: Dr. Stanford Breed  Discharge Diagnoses    Principal Problem:   Non-ST elevated myocardial infarction (non-STEMI) [facility]) Active Problems:   Essential (primary) hypertension   Hyperlipidemia   Type 2 diabetes mellitus without complication, with long-term current use of insulin (HCC)   CKD (chronic kidney disease) stage 4, GFR 15-29 ml/min (HCC)   Back pain   Allergies Allergies  Allergen Reactions  . Beef-Derived Products Hives    Red meat  . Sulfa Antibiotics Swelling     History of Present Illness     Erik Logan is a 84 y.o. male with a history of chronic renal insufficiency, type 2 diabetes on insulin, hypertension.  Erik Logan presented to the [facility] emergency department for evaluation of chest pain that started around 6 PM last night and felt like indigestion to the patient.  The patient was noted to have elevated troponin without ST elevation on EKG.  He was transferred to [facility] for further cardiac evaluation.  The patient was given nitroglycerin topically and became hypotensive in the ED. The nitroglycerin was removed and he was given normal saline 250 mL bolus with improvement in blood pressure.  Upon my evaluation the patient is currently without chest pain.  He denies any previous MI or cardiac issues and has never had any cardiac evaluation. He tells me that he had had some fruit cake with nuts yesterday afternoon and around 6 PM he developed chest discomfort/tightness that he felt was indigestion.  He took a Tums and "spit up some nuts" from the fruit cake.  He had no associated dyspnea, palpitations, lightheadedness, diaphoresis.  His discomfort resolved after about 1-1/2-2 hours and he went on to bed.  He was awakened around 4 AM with the same  central nonradiating chest pressure again with no associated symptoms except for mild nausea.  He has had heartburn before but this was different.  His pain continued and he presented to the ED in [place] at about 9 AM.  He was given topical nitroglycerin and the pain resolved.  He did note some return of his discomfort while he was in the ambulance on his way to [facility] but that did not last long.  The patient denies any bleeding issues.  He did have a duodenal ulcer 30-40 years ago but no problems since then.  Prior to this episode the patient was not aware of any exertional chest discomfort.  He has noted some mild dyspnea on exertion with taking his trash out to the curb over the last couple of months.  He is a remote smoker having quit 30-40 years ago.  He does have a history of type 2 diabetes on insulin, hypertension and some hyperlipidemia that apparently was not enough that it was felt he needed treatment.  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  He has no known family history of cardiac issues.   Outside hospital chart reviewed with the following significant findings: Serum creatinine 1.31, potassium 4.5, magnesium 1.8, hemoglobin 14.4, WBCs 8.2, platelet count 198 Troponin 0.23, 0.50 Chest x-ray with progression of mild bibasilar airspace disease.  Possible progressive fibrosis versus atelectasis/pneumonia Hip/pelvis x-ray: Mild bilateral hip osteoarthritis.  No  acute or focal abnormality Left knee x-ray: Loss of joint space in the medial compartment with medial and patellofemoral degenerative spurring EKG showed sinus bradycardia with prolonged PR interval, 52 bpm, and nonspecific T wave abnormalities, follow-up EKG with no significant change except heart rate of 45 bpm  There are some telemetry strips in the chart from [facility] that look like could represent atrial fibrillation  however there was no mention of this in the documentation.  I did call and speak to Dr. Felton Clinton who said that he noticed no atrial fibrillation while the patient was in the emergency room there.  Hospital Course     Consultants: none  NSTEMI Pt was transferred to [facility] for NSTEMI. He was taken for heart catheterization on 06/02/17. He was found to have multi-vessel disease. He underwent PTCA of the mid PDA that was 90% stenosed, DES to the distal RCA that was 90% stenosed, and DES to proximal-mid RCA that was 95 and 50% stenosed. Plan medical therapy for residual CAD. Echo showed normal LVEF of 55-60% with grade 1 DD. No recurrent chest pain.   Questionable Afib There was initially a question of atrial fibrillation on telemetry strips sent with the patient from OSH. In consultation with the attending at OSH, pt never had Afib. There has been no evidence of Afib on telemetry at [facility]. These strips may be a medical error. We will not treat for Afib this admission.  HLD - 06/02/2017: Cholesterol 171; HDL 34; LDL Cholesterol 116; Triglycerides 106; VLDL 21  - Continue statin. Check lipid panel and LFT in 6 weeks.   HTN - BP mildly elevated; will resume lisinopril 10 mg daily and HCTZ 12.5 mg daily; check BMET one week following DC. Continue amlodipine.   Hypokalemia - Resolved  CKD stage III - Renal function stable; recheck bmet one week.  Patient seen and examined by Dr. Stanford Breed today and was stable for discharge. All follow up has been arranged.   Discharge Vitals Blood pressure (!) 153/54, pulse (!) 53, temperature 97.9 F (36.6 C), temperature source Oral, resp. rate 16, height 5\' 8"  (1.727 m), weight 187 lb 6.3 oz (85 kg), SpO2 96 %.  Filed Weights   06/01/17 1700 06/02/17 0628 06/03/17 0341  Weight: 184 lb 14.4 oz (83.9 kg) 186 lb 4.8 oz (84.5 kg) 187 lb 6.3 oz (85 kg)    Labs & Radiologic Studies    CBC Recent Labs    06/02/17 0542 06/03/17 0336  WBC 11.1* 11.0*  HGB 12.1*  12.1*  HCT 36.0* 35.5*  MCV 87.8 85.7  PLT 179 702   Basic Metabolic Panel Recent Labs    06/02/17 0542 06/03/17 0336  NA 137 136  K 3.6 3.3*  CL 104 106  CO2 18* 22  GLUCOSE 184* 159*  BUN 22* 19  CREATININE 1.55* 1.44*  CALCIUM 8.5* 8.2*   Liver Function Tests No results for input(s): AST, ALT, ALKPHOS, BILITOT, PROT, ALBUMIN in the last 72 hours. No results for input(s): LIPASE, AMYLASE in the last 72 hours. Cardiac Enzymes Recent Labs    06/01/17 2224 06/02/17 0542 06/02/17 1416  TROPONINI 14.67* 17.18* 15.13*   BNP Invalid input(s): POCBNP D-Dimer No results for input(s): DDIMER in the last 72 hours. Hemoglobin A1C Recent Labs    06/01/17 1741  HGBA1C 9.1*   Fasting Lipid Panel Recent Labs    06/02/17 0542  CHOL 171  HDL 34*  LDLCALC 116*  TRIG 106  CHOLHDL 5.0   Thyroid Function Tests  Recent Labs    06/01/17 1741  TSH 1.318   _____________  No results found.   Diagnostic Studies/Procedures    Echo 06/02/17: Study Conclusions - Left ventricle: The cavity size was normal. Wall thickness was   increased in a pattern of mild LVH. Systolic function was normal.   The estimated ejection fraction was in the range of 55% to 60%.   Wall motion was normal; there were no regional wall motion   abnormalities. Doppler parameters are consistent with abnormal   left ventricular relaxation (grade 1 diastolic dysfunction). - Mitral valve: Mildly calcified annulus. Valve area by pressure   half-time: 1.85 cm^2.    Heart cath 06/01/17:  Prox RCA lesion is 95% stenosed.  Prox RCA to Mid RCA lesion is 50% stenosed.  Ramus lesion is 50% stenosed.  Ost 2nd Diag to 2nd Diag lesion is 100% stenosed.  RPDA lesion is 90% stenosed.  Balloon angioplasty was performed using a BALLOON SAPPHIRE 2.0X12.  Post intervention, there is a 0% residual stenosis.  Dist RCA lesion is 90% stenosed.  Post intervention, there is a 0% residual stenosis.  A stent was  successfully placed.  A stent was successfully placed.  Post intervention, there is a 0% residual stenosis.  Post intervention, there is a 0% residual stenosis.  Ost 1st Diag to 1st Diag lesion is 85% stenosed.   Multi-vessel CAD with 85% long stenosis in the first diagonal branch of the LAD, initial total occlusion of the second diagonal vessel with later mild opening to 99%, 50% stenosis in the ramus intermediate vessel; normal left circumflex vessel; and diffusely diseased dominant RCA with 95% proximal stenosis followed by diffuse irregularity of 50%, 90% focal stenosis distally beyond the acute margin prior to the PDA, and focal 90% mid PDA stenosis.  LVEDP 17 mm.  Successful multi lesion PCI to the RCA with PTCA of the mid PDA 90% stenosis reduced to 0%; PCI/DES stenting of the 90% distal RCA stenosis treated with a 2.515 mm Resolute stent post dilated to 2.7 mm with the stenosis being reduced to 0%, and insertion of a 3.026 mm Resolute stent to cover the 95 and 50% proximal to mid RCA stenoses, post dilated up to 3.2 mm with the stenoses being reduced to 0%.  RECOMMENDATION: Echo Doppler evaluation for LV function assessment.  If the patient develops recurrent chest pain symptomatology, consider possible PCI to the second diagonal vessel.  Otherwise, consider medical therapy with beta blocker, nitrates.  Recommended dual antiplatelet therapy for minimum of 1 year.  Aggressive lipid intervention.  Echo 06/03/17 Study Conclusions  - Left ventricle: The cavity size was normal. Wall thickness was   increased in a pattern of mild LVH. Systolic function was normal.   The estimated ejection fraction was in the range of 55% to 60%.   Wall motion was normal; there were no regional wall motion   abnormalities. Doppler parameters are consistent with abnormal   left ventricular relaxation (grade 1 diastolic dysfunction). - Mitral valve: Mildly calcified annulus. Valve area by pressure    half-time: 1.85 cm^2 Disposition   Pt is being discharged home today in good condition.  Follow-up Plans & Appointments    Follow-up Information    Arnoldo Lenis, MD Follow up on 06/22/2017.   Specialty:  Cardiology Why:  Please arrive 15 minutes early for you 4:20pm Cardiology appointment Contact information: [place] [id number] [facility] [employer]. Go on 06/10/2017.   Specialty:  Cardiology Why:  for kidney function check. Between 8am to 5pm. non fasting.  Contact information: [place] [phone]         Discharge Instructions    Amb Referral to Cardiac Rehabilitation   Complete by:  As directed    Diagnosis:   Coronary Stents NSTEMI     Diet - low sodium heart healthy   Complete by:  As directed    Discharge instructions   Complete by:  As directed    NO HEAVY LIFTING (>10lbs) X 2 WEEKS. NO SEXUAL ACTIVITY X 2 WEEKS. NO DRIVING X 1 WEEK. NO SOAKING BATHS, HOT TUBS, POOLS, ETC., X 7 DAYS.   Increase activity slowly   Complete by:  As directed       Discharge Medications   Allergies as of 06/03/2017      Reactions   Beef-derived Products Hives   Red meat   Sulfa Antibiotics Swelling      Medication List    STOP taking these medications   lisinopril-hydrochlorothiazide 20-25 MG tablet Commonly known as:  PRINZIDE,ZESTORETIC     TAKE these medications   amLODipine 5 MG tablet Commonly known as:  NORVASC Take 5 mg by mouth daily.   aspirin EC 81 MG tablet Take 1 tablet (81 mg total) by mouth daily. What changed:    when to take this  reasons to take this   atorvastatin 80 MG tablet Commonly known as:  LIPITOR Take 1 tablet (80 mg total) by mouth daily at 6 PM.   hydrochlorothiazide 12.5 MG capsule Commonly known as:  MICROZIDE Take 1 capsule (12.5 mg total) by mouth daily. Start taking on:  06/04/2017   insulin  lispro 100 UNIT/ML injection Commonly known as:  HUMALOG Inject 5 Units into the skin See admin instructions. Per Sliding Scale   isosorbide mononitrate 30 MG 24 hr tablet Commonly known as:  IMDUR Take 1 tablet (30 mg total) by mouth daily. Start taking on:  06/04/2017   lisinopril 10 MG tablet Commonly known as:  PRINIVIL,ZESTRIL Take 1 tablet (10 mg total) by mouth daily. Start taking on:  06/04/2017   metoprolol tartrate 25 MG tablet Commonly known as:  LOPRESSOR Take 0.5 tablets (12.5 mg total) by mouth daily. What changed:    medication strength  how much to take   nitroGLYCERIN 0.4 MG SL tablet Commonly known as:  NITROSTAT Place 1 tablet (0.4 mg total) under the tongue every 5 (five) minutes x 3 doses as needed for chest pain.   ticagrelor 90 MG Tabs tablet Commonly known as:  BRILINTA Take 1 tablet (90 mg total) by mouth 2 (two) times daily.   TRESIBA FLEXTOUCH 100 UNIT/ML Sopn FlexTouch Pen Generic drug:  insulin degludec Inject 25 Units into the skin every morning.        Aspirin prescribed at discharge?  Yes High Intensity Statin Prescribed? (Lipitor 40-80mg  or Crestor 20-40mg ): Yes Beta Blocker Prescribed? Yes For EF <40%, was ACEI/ARB Prescribed? Yes ADP Receptor Inhibitor Prescribed? (i.e. Plavix etc.-Includes Medically Managed Patients): Yes For EF <40%, Aldosterone Inhibitor Prescribed? N/A Was EF assessed during THIS hospitalization? Yes Was Cardiac Rehab II ordered? (Included Medically managed Patients): Yes   Outstanding Labs/Studies   Consider OP f/u labs 6-8 weeks given statin initiation this admission. BMET in one week   Duration of Discharge Encounter   Greater than 30 minutes including physician time.  Signed, Crista Luria Holmes Hays PA-C 06/03/2017, 1:36 PM

## 2017-06-03 NOTE — Progress Notes (Signed)
  Echocardiogram 2D Echocardiogram has been performed.  [name] 06/03/2017, 9:01 AM

## 2017-06-10 ENCOUNTER — Other Ambulatory Visit: Payer: Self-pay

## 2017-06-10 ENCOUNTER — Telehealth: Payer: Self-pay | Admitting: *Deleted

## 2017-06-10 DIAGNOSIS — N183 Chronic kidney disease, stage 3 unspecified: Secondary | ICD-10-CM

## 2017-06-10 MED ORDER — AMLODIPINE BESYLATE 10 MG PO TABS: 10.0000 mg | ORAL_TABLET | Freq: Every day | ORAL | 3 refills | Status: DC

## 2017-06-10 NOTE — Telephone Encounter (Signed)
-----   Message from [place], [place] sent at 06/10/2017  3:12 PM EDT ----- Scr worsen to 1.7. Stop lisinopril and HCTZ and increase Amlodipine to 10mg  qd until office visit with Dr. Harl Bowie.

## 2017-06-22 ENCOUNTER — Ambulatory Visit (INDEPENDENT_AMBULATORY_CARE_PROVIDER_SITE_OTHER): Payer: Medicare Other | Admitting: Cardiology

## 2017-06-22 ENCOUNTER — Encounter: Payer: Self-pay | Admitting: Cardiology

## 2017-06-22 VITALS — BP 155/77 | HR 65 | Ht 68.0 in | Wt 186.8 lb

## 2017-06-22 DIAGNOSIS — I2510 Atherosclerotic heart disease of native coronary artery without angina pectoris: Secondary | ICD-10-CM

## 2017-06-22 DIAGNOSIS — E782 Mixed hyperlipidemia: Secondary | ICD-10-CM

## 2017-06-22 DIAGNOSIS — I10 Essential (primary) hypertension: Secondary | ICD-10-CM | POA: Diagnosis not present

## 2017-06-22 MED ORDER — HYDRALAZINE HCL 25 MG PO TABS: 25.0000 mg | ORAL_TABLET | Freq: Three times a day (TID) | ORAL | 1 refills | Status: DC

## 2017-06-22 MED ORDER — TICAGRELOR 90 MG PO TABS: 90.0000 mg | ORAL_TABLET | Freq: Two times a day (BID) | ORAL | 0 refills | Status: DC

## 2017-06-22 NOTE — Progress Notes (Signed)
Clinical Summary Mr. Dragone is a 84 y.o.male seen for hospital follow up, this is our first visit together.  1. CAD - admitted with NSTEMI 05/2017 - see cath report below. S/p PTCA of mid PDA, DES to distal RCA, DES to prox RCA with plans for medical therapy of residual disease   - no recent chest pain. Chronic stable SOB.   2. Hyperlipidemia - compliant with statin.   3. HTN - home bp's daily, on average 140s/70s - compliant with meds  4. CKD III   Past Medical History:  Diagnosis Date  . Chronic renal insufficiency, stage 2 (mild)   . Coronary artery disease   . History of duodenal ulcer 1970s  . Hyperlipidemia   . Hypertension   . NSTEMI (non-ST elevated myocardial infarction) ([place]) 06/01/2017  . Type II diabetes mellitus (HCC)      Allergies  Allergen Reactions  . Beef-Derived Products Hives    Red meat  . Sulfa Antibiotics Swelling     Current Outpatient Medications  Medication Sig Dispense Refill  . amLODipine (NORVASC) 10 MG tablet Take 1 tablet (10 mg total) by mouth daily. 180 tablet 3  . aspirin EC 81 MG tablet Take 1 tablet (81 mg total) by mouth daily.    Marland Kitchen atorvastatin (LIPITOR) 80 MG tablet Take 1 tablet (80 mg total) by mouth daily at 6 PM. 30 tablet 6  . hydrochlorothiazide (MICROZIDE) 12.5 MG capsule Take 1 capsule (12.5 mg total) by mouth daily. 30 capsule 6  . insulin lispro (HUMALOG) 100 UNIT/ML injection Inject 5 Units into the skin See admin instructions. Per Sliding Scale    . isosorbide mononitrate (IMDUR) 30 MG 24 hr tablet Take 1 tablet (30 mg total) by mouth daily. 30 tablet 6  . lisinopril (PRINIVIL,ZESTRIL) 10 MG tablet Take 1 tablet (10 mg total) by mouth daily. 30 tablet 6  . metoprolol tartrate (LOPRESSOR) 25 MG tablet Take 0.5 tablets (12.5 mg total) by mouth daily. 30 tablet 6  . nitroGLYCERIN (NITROSTAT) 0.4 MG SL tablet Place 1 tablet (0.4 mg total) under the tongue every 5 (five) minutes x 3 doses as needed for chest pain.  25 tablet 12  . ticagrelor (BRILINTA) 90 MG TABS tablet Take 1 tablet (90 mg total) by mouth 2 (two) times daily. 60 tablet 11  . TRESIBA FLEXTOUCH 100 UNIT/ML SOPN FlexTouch Pen Inject 25 Units into the skin every morning.      No current facility-administered medications for this visit.      Past Surgical History:  Procedure Laterality Date  . CATARACT EXTRACTION W/ INTRAOCULAR LENS IMPLANT Left   . CORONARY ANGIOPLASTY WITH STENT PLACEMENT  06/02/2017  . CORONARY BALLOON ANGIOPLASTY N/A 06/02/2017   Procedure: CORONARY BALLOON ANGIOPLASTY;  Surgeon: Troy Sine, MD;  Location: [facility];  Service: Cardiovascular;  Laterality: N/A;  . CORONARY STENT INTERVENTION N/A 06/02/2017   Procedure: CORONARY STENT INTERVENTION;  Surgeon: Troy Sine, MD;  Location: [facility];  Service: Cardiovascular;  Laterality: N/A;  . EYE SURGERY Right 1950s   "S/P injury in ~ 1942 which ; put a hole in the retina; can't see out of it; cataract developed; surgery to remove cataract in 1950  . LEFT HEART CATH AND CORONARY ANGIOGRAPHY N/A 06/02/2017   Procedure: LEFT HEART CATH AND CORONARY ANGIOGRAPHY;  Surgeon: Troy Sine, MD;  Location: [facility];  Service: Cardiovascular;  Laterality: N/A;  . TONSILLECTOMY  1930s  Allergies  Allergen Reactions  . Beef-Derived Products Hives    Red meat  . Sulfa Antibiotics Swelling      Family History  Problem Relation Age of Onset  . Diabetes Mother   . Hypertension Mother   . Pulmonary disease Father   . Pulmonary disease Sister   . Healthy Sister      Social History Mr. Duell reports that he has quit smoking. His smoking use included cigarettes. He has a 32.00 pack-year smoking history. He has never used smokeless tobacco. Mr. Carbonell reports that he does not drink alcohol.   Review of Systems CONSTITUTIONAL: No weight loss, fever, chills, weakness or fatigue.  HEENT: Eyes: No visual loss, blurred vision,  double vision or yellow sclerae.No hearing loss, sneezing, congestion, runny nose or sore throat.  SKIN: No rash or itching.  CARDIOVASCULAR: per hpi RESPIRATORY: per hpi GASTROINTESTINAL: No anorexia, nausea, vomiting or diarrhea. No abdominal pain or blood.  GENITOURINARY: No burning on urination, no polyuria NEUROLOGICAL: No headache, dizziness, syncope, paralysis, ataxia, numbness or tingling in the extremities. No change in bowel or bladder control.  MUSCULOSKELETAL: No muscle, back pain, joint pain or stiffness.  LYMPHATICS: No enlarged nodes. No history of splenectomy.  PSYCHIATRIC: No history of depression or anxiety.  ENDOCRINOLOGIC: No reports of sweating, cold or heat intolerance. No polyuria or polydipsia.  Marland Kitchen   Physical Examination Vitals:   06/22/17 1624  BP: (!) 155/77  Pulse: 65  SpO2: 100%   Vitals:   06/22/17 1624  Weight: 186 lb 12.8 oz (84.7 kg)  Height: 5\' 8"  (1.727 m)    Gen: resting comfortably, no acute distress HEENT: no scleral icterus, pupils equal round and reactive, no palptable cervical adenopathy,  CV: RRR, no m/r/g, no jvd Resp: Clear to auscultation bilaterally GI: abdomen is soft, non-tender, non-distended, normal bowel sounds, no hepatosplenomegaly MSK: extremities are warm, no edema.  Skin: warm, no rash Neuro:  no focal deficits Psych: appropriate affect   Diagnostic Studies 05/2017 cath  Prox RCA lesion is 95% stenosed.  Prox RCA to Mid RCA lesion is 50% stenosed.  Ramus lesion is 50% stenosed.  Ost 2nd Diag to 2nd Diag lesion is 100% stenosed.  RPDA lesion is 90% stenosed.  Balloon angioplasty was performed using a BALLOON SAPPHIRE 2.0X12.  Post intervention, there is a 0% residual stenosis.  Dist RCA lesion is 90% stenosed.  Post intervention, there is a 0% residual stenosis.  A stent was successfully placed.  A stent was successfully placed.  Post intervention, there is a 0% residual stenosis.  Post intervention,  there is a 0% residual stenosis.  Ost 1st Diag to 1st Diag lesion is 85% stenosed.   Multi-vessel CAD with 85% long stenosis in the first diagonal branch of the LAD, initial total occlusion of the second diagonal vessel with later mild opening to 99%, 50% stenosis in the ramus intermediate vessel; normal left circumflex vessel; and diffusely diseased dominant RCA with 95% proximal stenosis followed by diffuse irregularity of 50%, 90% focal stenosis distally beyond the acute margin prior to the PDA, and focal 90% mid PDA stenosis.  LVEDP 17 mm.  Successful multi lesion PCI to the RCA with PTCA of the mid PDA 90% stenosis reduced to 0%; PCI/DES stenting of the 90% distal RCA stenosis treated with a 2.515 mm Resolute stent post dilated to 2.7 mm with the stenosis being reduced to 0%, and insertion of a 3.026 mm Resolute stent to cover the 95 and 50% proximal to  mid RCA stenoses, post dilated up to 3.2 mm with the stenoses being reduced to 0%.  RECOMMENDATION: Echo Doppler evaluation for LV function assessment.  If the patient develops recurrent chest pain symptomatology, consider possible PCI to the second diagonal vessel.  Otherwise, consider medical therapy with beta blocker, nitrates.  Recommended dual antiplatelet therapy for minimum of 1 year.  Aggressive lipid intervention.  05/2017 echo Study Conclusions  - Left ventricle: The cavity size was normal. Wall thickness was   increased in a pattern of mild LVH. Systolic function was normal.   The estimated ejection fraction was in the range of 55% to 60%.   Wall motion was normal; there were no regional wall motion   abnormalities. Doppler parameters are consistent with abnormal   left ventricular relaxation (grade 1 diastolic dysfunction). - Mitral valve: Mildly calcified annulus. Valve area by pressure   half-time: 1.85 cm^2.    Assessment and Plan  1. CAD - recent stenting as described above, continue DAPT at least until 05/2018 -  not on ACE-I due to poor renal function - refer to cardiac rehab  2. Hyperlipidemia - continue statin, repeat labs over next few months  3. HTN - above goal. Limited additional options given his renal dysfunction - start hydralazine 25mg  tid.  - submit bp log in 1 week.    F/u 3 months   Arnoldo Lenis, M.D.

## 2017-06-22 NOTE — Patient Instructions (Signed)
Your physician recommends that you schedule a follow-up appointment in: [place] has recommended you make the following change in your medication:   STOP HCTZ  START HYDRALAZINE 25 MG 3 TIMES DAILY  You have been referred to [facility] has requested that you regularly monitor and record your blood pressure readings at home FOR 1 [place]. Please use the same machine at the same time of day to check your readings and record them to bring to your follow-up visit.  Thank you for choosing [facility]!!

## 2017-06-26 ENCOUNTER — Encounter: Payer: Self-pay | Admitting: Cardiology

## 2017-06-30 DIAGNOSIS — M545 Low back pain: Secondary | ICD-10-CM | POA: Diagnosis not present

## 2017-06-30 DIAGNOSIS — E1165 Type 2 diabetes mellitus with hyperglycemia: Secondary | ICD-10-CM | POA: Diagnosis not present

## 2017-06-30 DIAGNOSIS — I10 Essential (primary) hypertension: Secondary | ICD-10-CM | POA: Diagnosis not present

## 2017-07-02 ENCOUNTER — Telehealth: Payer: Self-pay | Admitting: Cardiology

## 2017-07-02 DIAGNOSIS — E1165 Type 2 diabetes mellitus with hyperglycemia: Secondary | ICD-10-CM | POA: Diagnosis not present

## 2017-07-02 NOTE — Telephone Encounter (Signed)
Patient c/o SOB and LE swelling. States that is somewhat better in the AM. Patient reports wt. Yesterday as 186 lbs and today 191.bs.

## 2017-07-02 NOTE — Telephone Encounter (Signed)
Called pt no answer. Unable to leave msg.  

## 2017-07-02 NOTE — Telephone Encounter (Signed)
Patient walked in asking about his relationship with Dr Harl Bowie.  What questions can he ask.   SOB with ankle and calves swelling and would like to discuss medication reactions.  Thinking Brilanta maybe  Cause.  Stated he weighed 185.6 this morning.  Last night he said he was 190.

## 2017-07-06 DIAGNOSIS — M545 Low back pain: Secondary | ICD-10-CM | POA: Diagnosis not present

## 2017-07-06 DIAGNOSIS — I10 Essential (primary) hypertension: Secondary | ICD-10-CM | POA: Diagnosis not present

## 2017-07-06 DIAGNOSIS — E1165 Type 2 diabetes mellitus with hyperglycemia: Secondary | ICD-10-CM | POA: Diagnosis not present

## 2017-07-06 NOTE — Telephone Encounter (Signed)
Can stop brillinta, start plavix 300mg  x 1 they 75mg  daily. Very important that he not stop the brillinta until he has the new medicine to take. Have him update on his symptoms on Friday. His weight from whats reported today at 186 lbs (as opposed to 190 lbs a few days ago) is what we had at our last clinic visit. How is the leg swelling doing?   Zandra Abts MD

## 2017-07-06 NOTE — Telephone Encounter (Signed)
Per Dr Harl Bowie pt could be seen 4/24 [place] office @ 2pm - no answer no VM will call back

## 2017-07-06 NOTE — Telephone Encounter (Signed)
Pt doesn't want to stop Brilinta and start plavix says he could live with some SOB - says swelling hasn't changed much and that is a small amount in ankles and legs. Requested to come in for OV offered soonest 5/14 appt in [place] but pt declined and wanted to be placed on wait list.

## 2017-07-06 NOTE — Telephone Encounter (Signed)
Pt says he is still c/o of SOB with activity and legs and ankles swelling - concerned that brilinta is causing SOB - pt weight is 186lbs today - frustrated that no one called him back Friday - BP last 2 days 125/61 HR 55 138/71 55 - denies chest pain/dizziness

## 2017-07-07 NOTE — Telephone Encounter (Signed)
Pt currently on wait list

## 2017-07-07 NOTE — Telephone Encounter (Signed)
Pt cannot make appt 4/24 has an appt with endocrinologist

## 2017-07-08 DIAGNOSIS — R600 Localized edema: Secondary | ICD-10-CM | POA: Diagnosis not present

## 2017-07-08 DIAGNOSIS — E663 Overweight: Secondary | ICD-10-CM | POA: Diagnosis not present

## 2017-07-08 DIAGNOSIS — K224 Dyskinesia of esophagus: Secondary | ICD-10-CM | POA: Diagnosis not present

## 2017-07-08 DIAGNOSIS — E785 Hyperlipidemia, unspecified: Secondary | ICD-10-CM | POA: Diagnosis not present

## 2017-07-08 DIAGNOSIS — I2510 Atherosclerotic heart disease of native coronary artery without angina pectoris: Secondary | ICD-10-CM | POA: Diagnosis not present

## 2017-07-08 DIAGNOSIS — E1165 Type 2 diabetes mellitus with hyperglycemia: Secondary | ICD-10-CM | POA: Diagnosis not present

## 2017-07-08 DIAGNOSIS — E559 Vitamin D deficiency, unspecified: Secondary | ICD-10-CM | POA: Diagnosis not present

## 2017-07-08 DIAGNOSIS — E1359 Other specified diabetes mellitus with other circulatory complications: Secondary | ICD-10-CM | POA: Diagnosis not present

## 2017-07-08 DIAGNOSIS — I7389 Other specified peripheral vascular diseases: Secondary | ICD-10-CM | POA: Diagnosis not present

## 2017-07-08 DIAGNOSIS — I10 Essential (primary) hypertension: Secondary | ICD-10-CM | POA: Diagnosis not present

## 2017-07-15 ENCOUNTER — Ambulatory Visit (INDEPENDENT_AMBULATORY_CARE_PROVIDER_SITE_OTHER): Payer: Medicare Other | Admitting: Cardiology

## 2017-07-15 ENCOUNTER — Other Ambulatory Visit: Payer: Self-pay

## 2017-07-15 ENCOUNTER — Encounter: Payer: Self-pay | Admitting: Cardiology

## 2017-07-15 VITALS — BP 146/69 | HR 57 | Ht 68.5 in | Wt 188.0 lb

## 2017-07-15 DIAGNOSIS — I10 Essential (primary) hypertension: Secondary | ICD-10-CM

## 2017-07-15 DIAGNOSIS — R0602 Shortness of breath: Secondary | ICD-10-CM

## 2017-07-15 DIAGNOSIS — I2510 Atherosclerotic heart disease of native coronary artery without angina pectoris: Secondary | ICD-10-CM

## 2017-07-15 DIAGNOSIS — E782 Mixed hyperlipidemia: Secondary | ICD-10-CM | POA: Diagnosis not present

## 2017-07-15 DIAGNOSIS — N184 Chronic kidney disease, stage 4 (severe): Secondary | ICD-10-CM

## 2017-07-15 MED ORDER — FUROSEMIDE 40 MG PO TABS: 40.0000 mg | ORAL_TABLET | Freq: Every day | ORAL | 1 refills | Status: DC

## 2017-07-15 NOTE — Progress Notes (Signed)
Clinical Summary Mr. Petrich is a 84 y.o.male seen for hospital follow up, this is our first visit together.  1. CAD - admitted with NSTEMI 05/2017 - see cath report below. S/p PTCA of mid PDA, DES to distal RCA, DES to prox RCA with plans for medical therapy of residual disease 05/2017 echo LVEF 55-60%, no WMAs, grade I diastolic dysfunction  - no recent chest pain. Chronic stable SOB.   2. Hyperlipidemia - he is compliant with statin  3. HTN - home bp's 120s-130s/60-70s - remains compliant with meds  4. CKD III   5. SOB - recent SOB. Reports some fluid gain - home weights 190 -196 lbs, up from 185.   Past Medical History:  Diagnosis Date  . Chronic renal insufficiency, stage 2 (mild)   . Coronary artery disease   . History of duodenal ulcer 1970s  . Hyperlipidemia   . Hypertension   . NSTEMI (non-ST elevated myocardial infarction) ([place]) 06/01/2017  . Type II diabetes mellitus (H[facility])      Allergies  Allergen Reactions  . Beef-Derived Products Hives    Red meat  . Sulfa Antibiotics Swelling     Current Outpatient Medications  Medication Sig Dispense Refill  . amLODipine (NORVASC) 10 MG tablet Take 1 tablet (10 mg total) by mouth daily. 180 tablet 3  . aspirin EC 81 MG tablet Take 1 tablet (81 mg total) by mouth daily.    Marland Kitchen atorvastatin (LIPITOR) 80 MG tablet Take 1 tablet (80 mg total) by mouth daily at 6 PM. 30 tablet 6  . hydrALAZINE (APRESOLINE) 25 MG tablet Take 1 tablet (25 mg total) by mouth 3 (three) times daily. 270 tablet 1  . insulin lispro (HUMALOG) 100 UNIT/ML injection Inject 5 Units into the skin See admin instructions. Per Sliding Scale    . isosorbide mononitrate (IMDUR) 30 MG 24 hr tablet Take 1 tablet (30 mg total) by mouth daily. 30 tablet 6  . metoprolol tartrate (LOPRESSOR) 25 MG tablet Take 0.5 tablets (12.5 mg total) by mouth daily. 30 tablet 6  . nitroGLYCERIN (NITROSTAT) 0.4 MG SL tablet Place 1 tablet (0.4 mg total) under the  tongue every 5 (five) minutes x 3 doses as needed for chest pain. 25 tablet 12  . ticagrelor (BRILINTA) 90 MG TABS tablet Take 1 tablet (90 mg total) by mouth 2 (two) times daily. 24 tablet 0  . TRESIBA FLEXTOUCH 100 UNIT/ML SOPN FlexTouch Pen Inject 25 Units into the skin every morning.      No current facility-administered medications for this visit.      Past Surgical History:  Procedure Laterality Date  . CATARACT EXTRACTION W/ INTRAOCULAR LENS IMPLANT Left   . CORONARY ANGIOPLASTY WITH STENT PLACEMENT  06/02/2017  . CORONARY BALLOON ANGIOPLASTY N/A 06/02/2017   Procedure: CORONARY BALLOON ANGIOPLASTY;  Surgeon: Troy Sine, MD;  Location: [facility];  Service: Cardiovascular;  Laterality: N/A;  . CORONARY STENT INTERVENTION N/A 06/02/2017   Procedure: CORONARY STENT INTERVENTION;  Surgeon: Troy Sine, MD;  Location: [facility];  Service: Cardiovascular;  Laterality: N/A;  . EYE SURGERY Right 1950s   "S/P injury in ~ 1942 which ; put a hole in the retina; can't see out of it; cataract developed; surgery to remove cataract in 1950  . LEFT HEART CATH AND CORONARY ANGIOGRAPHY N/A 06/02/2017   Procedure: LEFT HEART CATH AND CORONARY ANGIOGRAPHY;  Surgeon: Troy Sine, MD;  Location: [facility];  Service: Cardiovascular;  Laterality: N/A;  . TONSILLECTOMY  1930s     Allergies  Allergen Reactions  . Beef-Derived Products Hives    Red meat  . Sulfa Antibiotics Swelling      Family History  Problem Relation Age of Onset  . Diabetes Mother   . Hypertension Mother   . Pulmonary disease Father   . Pulmonary disease Sister   . Healthy Sister      Social History Mr. Eidson reports that he has quit smoking. His smoking use included cigarettes. He has a 32.00 pack-year smoking history. He has never used smokeless tobacco. Mr. Franks reports that he does not drink alcohol.   Review of Systems CONSTITUTIONAL: No weight loss, fever, chills,  weakness or fatigue.  HEENT: Eyes: No visual loss, blurred vision, double vision or yellow sclerae.No hearing loss, sneezing, congestion, runny nose or sore throat.  SKIN: No rash or itching.  CARDIOVASCULAR: per hpi RESPIRATORY: per hpi GASTROINTESTINAL: No anorexia, nausea, vomiting or diarrhea. No abdominal pain or blood.  GENITOURINARY: No burning on urination, no polyuria NEUROLOGICAL: No headache, dizziness, syncope, paralysis, ataxia, numbness or tingling in the extremities. No change in bowel or bladder control.  MUSCULOSKELETAL: No muscle, back pain, joint pain or stiffness.  LYMPHATICS: No enlarged nodes. No history of splenectomy.  PSYCHIATRIC: No history of depression or anxiety.  ENDOCRINOLOGIC: No reports of sweating, cold or heat intolerance. No polyuria or polydipsia.  Marland Kitchen   Physical Examination Vitals:   07/15/17 0856  BP: (!) 146/69  Pulse: (!) 57  SpO2: 99%   Vitals:   07/15/17 0856  Weight: 188 lb (85.3 kg)  Height: 5' 8.5" (1.74 m)    Gen: resting comfortably, no acute distress HEENT: no scleral icterus, pupils equal round and reactive, no palptable cervical adenopathy,  CV: RRR, no m/r/g, no jvd Resp: Clear to auscultation bilaterally GI: abdomen is soft, non-tender, non-distended, normal bowel sounds, no hepatosplenomegaly MSK: extremities are warm, no edema.  Skin: warm, no rash Neuro:  no focal deficits Psych: appropriate affect   Diagnostic Studies 05/2017 cath  Prox RCA lesion is 95% stenosed.  Prox RCA to Mid RCA lesion is 50% stenosed.  Ramus lesion is 50% stenosed.  Ost 2nd Diag to 2nd Diag lesion is 100% stenosed.  RPDA lesion is 90% stenosed.  Balloon angioplasty was performed using a BALLOON SAPPHIRE 2.0X12.  Post intervention, there is a 0% residual stenosis.  Dist RCA lesion is 90% stenosed.  Post intervention, there is a 0% residual stenosis.  A stent was successfully placed.  A stent was successfully placed.  Post  intervention, there is a 0% residual stenosis.  Post intervention, there is a 0% residual stenosis.  Ost 1st Diag to 1st Diag lesion is 85% stenosed.  Multi-vessel CAD with 85% long stenosis in the first diagonal Suzette Flagler of the LAD, initial total occlusion of the second diagonal vessel with later mild opening to 99%, 50% stenosis in the ramus intermediate vessel; normal left circumflex vessel; and diffusely diseased dominant RCA with 95% proximal stenosis followed by diffuse irregularity of 50%, 90% focal stenosis distally beyond the acute margin prior to the PDA, and focal 90% mid PDA stenosis.  LVEDP 17 mm.  Successful multi lesion PCI to the RCA with PTCA of the mid PDA 90% stenosis reduced to 0%; PCI/DES stenting of the 90% distal RCA stenosis treated with a 2.515 mm Resolute stent post dilated to 2.7 mm with the stenosis being reduced to 0%, and insertion of a  3.026 mm Resolute stent to cover the 95 and 50% proximal to mid RCA stenoses, post dilated up to 3.2 mm with the stenoses being reduced to 0%.  RECOMMENDATION: Echo Doppler evaluation for LV function assessment. If the patient develops recurrent chest pain symptomatology, consider possible PCI to the second diagonal vessel. Otherwise, consider medical therapy with beta blocker, nitrates. Recommended dual antiplatelet therapy for minimum of 1 year. Aggressive lipid intervention.  05/2017 echo Study Conclusions  - Left ventricle: The cavity size was normal. Wall thickness was increased in a pattern of mild LVH. Systolic function was normal. The estimated ejection fraction was in the range of 55% to 60%. Wall motion was normal; there were no regional wall motion abnormalities. Doppler parameters are consistent with abnormal left ventricular relaxation (grade 1 diastolic dysfunction). - Mitral valve: Mildly calcified annulus. Valve area by pressure half-time: 1.85 cm^2.      Assessment and Plan  1. CAD -  recent stenting as described above, continue DAPT at least until 05/2018 - not on ACE-I due to poor renal function  - continuecurrent meds  2. Hyperlipidemia - he will continue statin  3. HTN - followed with increased diuresis  4. SOB - recent weight gain and SOB - recently started on hydralazine, unclear if related though this is a possible side effect.  - try increasing lasix to 40mg  daily. In 2 weeks check BMET/Mg/TSH.  5. CKD III - repeat labs in 2 weeks.    F/u 1 month       Arnoldo Lenis, M.D.

## 2017-07-15 NOTE — Patient Instructions (Addendum)
Your physician recommends that you schedule a follow-up appointment in: [place] has recommended you make the following change in your medication:   INCREASE LASIX 40 MG DAILY  Your physician recommends that you return for lab work in: 2 WEEKS BMP/MG/TSH - [facility] OFFICE ON Friday WITH AN UPDATE OF YOUR WEIGHTS AND SWELLING    Low-Sodium Eating Plan Sodium, which is an element that makes up salt, helps you maintain a healthy balance of fluids in your body. Too much sodium can increase your blood pressure and cause fluid and waste to be held in your body. Your health care provider or dietitian may recommend following this plan if you have high blood pressure (hypertension), kidney disease, liver disease, or heart failure. Eating less sodium can help lower your blood pressure, reduce swelling, and protect your heart, liver, and kidneys. What are tips for following this plan? General guidelines  Most people on this plan should limit their sodium intake to 1,500-2,000 mg (milligrams) of sodium each day. Reading food labels  The Nutrition Facts label lists the amount of sodium in one serving of the food. If you eat more than one serving, you must multiply the listed amount of sodium by the number of servings.  Choose foods with less than 140 mg of sodium per serving.  Avoid foods with 300 mg of sodium or more per serving. Shopping  Look for lower-sodium products, often labeled as "low-sodium" or "no salt added."  Always check the sodium content even if foods are labeled as "unsalted" or "no salt added".  Buy fresh foods. ? Avoid canned foods and premade or frozen meals. ? Avoid canned, cured, or processed meats  Buy breads that have less than 80 mg of sodium per slice. Cooking  Eat more home-cooked food and less restaurant, buffet, and fast food.  Avoid adding salt when cooking. Use salt-free seasonings or herbs  instead of table salt or sea salt. Check with your health care provider or pharmacist before using salt substitutes.  Cook with plant-based oils, such as canola, sunflower, or olive oil. Meal planning  When eating at a restaurant, ask that your food be prepared with less salt or no salt, if possible.  Avoid foods that contain MSG (monosodium glutamate). MSG is sometimes added to [place] food, bouillon, and some canned foods. What foods are recommended? The items listed may not be a complete list. Talk with your dietitian about what dietary choices are best for you. Grains Low-sodium cereals, including oats, puffed wheat and rice, and shredded wheat. Low-sodium crackers. Unsalted rice. Unsalted pasta. Low-sodium bread. Whole-grain breads and whole-grain pasta. Vegetables Fresh or frozen vegetables. "No salt added" canned vegetables. "No salt added" tomato sauce and paste. Low-sodium or reduced-sodium tomato and vegetable juice. Fruits Fresh, frozen, or canned fruit. Fruit juice. Meats and other protein foods Fresh or frozen (no salt added) meat, poultry, seafood, and fish. Low-sodium canned tuna and salmon. Unsalted nuts. Dried peas, beans, and lentils without added salt. Unsalted canned beans. Eggs. Unsalted nut butters. Dairy Milk. Soy milk. Cheese that is naturally low in sodium, such as ricotta cheese, fresh mozzarella, or Swiss cheese Low-sodium or reduced-sodium cheese. Cream cheese. Yogurt. Fats and oils Unsalted butter. Unsalted margarine with no trans fat. Vegetable oils such as canola or olive oils. Seasonings and other foods Fresh and dried herbs and spices. Salt-free seasonings. Low-sodium mustard and ketchup. Sodium-free salad dressing. Sodium-free light mayonnaise.  Fresh or refrigerated horseradish. Lemon juice. Vinegar. Homemade, reduced-sodium, or low-sodium soups. Unsalted popcorn and pretzels. Low-salt or salt-free chips. What foods are not recommended? The items listed may  not be a complete list. Talk with your dietitian about what dietary choices are best for you. Grains Instant hot cereals. Bread stuffing, pancake, and biscuit mixes. Croutons. Seasoned rice or pasta mixes. Noodle soup cups. Boxed or frozen macaroni and cheese. Regular salted crackers. Self-rising flour. Vegetables Sauerkraut, pickled vegetables, and relishes. Olives. [place] fries. Onion rings. Regular canned vegetables (not low-sodium or reduced-sodium). Regular canned tomato sauce and paste (not low-sodium or reduced-sodium). Regular tomato and vegetable juice (not low-sodium or reduced-sodium). Frozen vegetables in sauces. Meats and other protein foods Meat or fish that is salted, canned, smoked, spiced, or pickled. Bacon, ham, sausage, hotdogs, corned beef, chipped beef, packaged lunch meats, salt pork, jerky, pickled herring, anchovies, regular canned tuna, sardines, salted nuts. Dairy Processed cheese and cheese spreads. Cheese curds. Blue cheese. Feta cheese. String cheese. Regular cottage cheese. Buttermilk. Canned milk. Fats and oils Salted butter. Regular margarine. Ghee. Bacon fat. Seasonings and other foods Onion salt, garlic salt, seasoned salt, table salt, and sea salt. Canned and packaged gravies. Worcestershire sauce. Tartar sauce. Barbecue sauce. Teriyaki sauce. Soy sauce, including reduced-sodium. Steak sauce. Fish sauce. Oyster sauce. Cocktail sauce. Horseradish that you find on the shelf. Regular ketchup and mustard. Meat flavorings and tenderizers. Bouillon cubes. Hot sauce and Tabasco sauce. Premade or packaged marinades. Premade or packaged taco seasonings. Relishes. Regular salad dressings. Salsa. Potato and tortilla chips. Corn chips and puffs. Salted popcorn and pretzels. Canned or dried soups. Pizza. Frozen entrees and pot pies. Summary  Eating less sodium can help lower your blood pressure, reduce swelling, and protect your heart, liver, and kidneys.  Most people on this  plan should limit their sodium intake to 1,500-2,000 mg (milligrams) of sodium each day.  Canned, boxed, and frozen foods are high in sodium. Restaurant foods, fast foods, and pizza are also very high in sodium. You also get sodium by adding salt to food.  Try to cook at home, eat more fresh fruits and vegetables, and eat less fast food, canned, processed, or prepared foods. This information is not intended to replace advice given to you by your health care provider. Make sure you discuss any questions you have with your health care provider. Document Released: 08/23/2001 Document Revised: 02/25/2016 Document Reviewed: 02/25/2016 Elsevier Interactive Patient Education  2018 [employer].   Thank you for choosing [facility]!!

## 2017-07-17 ENCOUNTER — Telehealth: Payer: Self-pay | Admitting: Cardiology

## 2017-07-17 NOTE — Telephone Encounter (Signed)
Swelling has not gotten any worse  185 no clothes  [phone]

## 2017-07-17 NOTE — Telephone Encounter (Signed)
Will forward to provider - attempted to reach pt no answer

## 2017-07-20 ENCOUNTER — Encounter: Payer: Self-pay | Admitting: Cardiology

## 2017-07-20 NOTE — Telephone Encounter (Signed)
Sounds liek weightes are trending down, can we check back with him today and see how weights and symptoms are?   Zandra Abts MD

## 2017-07-20 NOTE — Telephone Encounter (Signed)
Pt says weight remains at 185LBS - says both feet swell during the day and swelling rises to his calf but no worse since LOV has been working outside a lot - says he would call us back if swelling got any worse. Has f/u 6/11

## 2017-07-24 ENCOUNTER — Encounter (HOSPITAL_COMMUNITY)
Admission: RE | Admit: 2017-07-24 | Discharge: 2017-07-24 | Disposition: A | Discharge status: Home / Self Care | Payer: Medicare Other | Source: Ambulatory Visit | Attending: Cardiology | Admitting: Cardiology

## 2017-07-24 ENCOUNTER — Encounter (HOSPITAL_COMMUNITY): Payer: Self-pay

## 2017-07-24 VITALS — BP 132/62 | HR 57 | Ht 67.0 in | Wt 186.6 lb

## 2017-07-24 DIAGNOSIS — Z955 Presence of coronary angioplasty implant and graft: Secondary | ICD-10-CM | POA: Insufficient documentation

## 2017-07-24 DIAGNOSIS — I214 Non-ST elevation (NSTEMI) myocardial infarction: Secondary | ICD-10-CM | POA: Diagnosis not present

## 2017-07-24 DIAGNOSIS — Z48812 Encounter for surgical aftercare following surgery on the circulatory system: Secondary | ICD-10-CM | POA: Diagnosis not present

## 2017-07-24 NOTE — Progress Notes (Signed)
Cardiac/Pulmonary Rehab Medication Review by a Pharmacist  Does the patient  feel that his/her medications are working for him/her?  yes  Has the patient been experiencing any side effects to the medications prescribed?  Yes, muscle pains, swelling.  Discussed potential for Lipitor to cause myalgias, he will bring up with MD at next visit.  Patient also believes ticagrelor may be cause of swelling.  Does the patient measure his/her own blood pressure or blood glucose at home?  yes   Does the patient have any problems obtaining medications due to transportation or finances?   Yes, cost is expensive but able to still obtain.  Especially Treseiba, Humalog and Brilinta  Understanding of regimen: good Understanding of indications: good Potential of compliance: good  Pharmacist comments: Patient has concerns about excess fluid and swelling.  Discussed monitoring fluid status and keeping providers informed if worsens.  Pricilla Larsson 07/24/2017 9:03 AM

## 2017-07-24 NOTE — Progress Notes (Signed)
Daily Session Note  Patient Details  Name: JAHAAN VANWAGNER MRN: [id number] Date of Birth: 1928/11/20 Referring Provider:     CARDIAC REHAB PHASE II ORIENTATION from 07/24/2017 in [place]  Referring Provider  Dr. Stanford Breed      Encounter Date: 07/24/2017  Check In: Session Check In - 07/24/17 0800      Check-In   Location  [facility]    Staff Present  Letia Guidry Angelina Pih, MS, EP, [facility], Exercise Physiologist;Gregory Luther Parody, BS, EP, Exercise Physiologist;Debra Wynetta Emery, RN, BSN    Supervising physician immediately available to respond to emergencies  See telemetry face sheet for immediately available MD    Medication changes reported      No    Fall or balance concerns reported     No    Tobacco Cessation  -- Quit 1984    Warm-up and Cool-down  Performed as group-led instruction    Resistance Training Performed  Yes    VAD Patient?  No      Pain Assessment   Currently in Pain?  No/denies    Pain Score  0-No pain    Multiple Pain Sites  No       Capillary Blood Glucose: No results found for this or any previous visit (from the past 24 hour(s)).    Social History   Tobacco Use  Smoking Status Former Smoker  . Packs/day: 1.00  . Years: 32.00  . Pack years: 32.00  . Types: Cigarettes  Smokeless Tobacco Never Used  Tobacco Comment   Quit around age 80    Goals Met:  Independence with exercise equipment Exercise tolerated well No report of cardiac concerns or symptoms Strength training completed today  Goals Unmet:  Not Applicable  Comments: Check out: 10:30   Dr. Kate Sable is Medical Director for [facility] and [facility].

## 2017-07-24 NOTE — Progress Notes (Signed)
Cardiac Individual Treatment Plan  Patient Details  Name: Erik Logan MRN: [id number] Date of Birth: 15-Sep-1928 Referring Provider:     CARDIAC REHAB PHASE II ORIENTATION from 07/24/2017 in [place]  Referring Provider  Dr. Stanford Breed      Initial Encounter Date:    CARDIAC REHAB PHASE II ORIENTATION from 07/24/2017 in [place]  Date  07/24/17  Referring Provider  Dr. Stanford Breed      Visit Diagnosis: NSTEMI (non-ST elevated myocardial infarction) [facility])  Status post coronary artery stent placement  Patient's Home Medications on Admission:  Current Outpatient Medications:  .  amLODipine (NORVASC) 10 MG tablet, Take 1 tablet (10 mg total) by mouth daily., Disp: 180 tablet, Rfl: 3 .  aspirin EC 81 MG tablet, Take 1 tablet (81 mg total) by mouth daily., Disp: , Rfl:  .  atorvastatin (LIPITOR) 80 MG tablet, Take 1 tablet (80 mg total) by mouth daily at 6 PM., Disp: 30 tablet, Rfl: 6 .  furosemide (LASIX) 40 MG tablet, Take 1 tablet (40 mg total) by mouth daily., Disp: 90 tablet, Rfl: 1 .  hydrALAZINE (APRESOLINE) 25 MG tablet, Take 1 tablet (25 mg total) by mouth 3 (three) times daily., Disp: 270 tablet, Rfl: 1 .  insulin lispro (HUMALOG) 100 UNIT/ML injection, Inject 5 Units into the skin See admin instructions. Per Sliding Scale, Disp: , Rfl:  .  isosorbide mononitrate (IMDUR) 30 MG 24 hr tablet, Take 1 tablet (30 mg total) by mouth daily., Disp: 30 tablet, Rfl: 6 .  metoprolol tartrate (LOPRESSOR) 25 MG tablet, Take 0.5 tablets (12.5 mg total) by mouth daily., Disp: 30 tablet, Rfl: 6 .  nitroGLYCERIN (NITROSTAT) 0.4 MG SL tablet, Place 1 tablet (0.4 mg total) under the tongue every 5 (five) minutes x 3 doses as needed for chest pain., Disp: 25 tablet, Rfl: 12 .  Polyethyl Glycol-Propyl Glycol (SYSTANE OP), Apply 2 drops to eye daily as needed (Dry eyes)., Disp: , Rfl:  .  ticagrelor (BRILINTA) 90 MG TABS tablet, Take 1 tablet (90 mg total)  by mouth 2 (two) times daily., Disp: 24 tablet, Rfl: 0 .  TRESIBA FLEXTOUCH 100 UNIT/ML SOPN FlexTouch Pen, Inject 25 Units into the skin every morning. , Disp: , Rfl:   Past Medical History: Past Medical History:  Diagnosis Date  . Chronic renal insufficiency, stage 2 (mild)   . Coronary artery disease   . History of duodenal ulcer 1970s  . Hyperlipidemia   . Hypertension   . NSTEMI (non-ST elevated myocardial infarction) ([facility]) 06/01/2017  . Type II diabetes mellitus (HCC)     Tobacco Use: Social History   Tobacco Use  Smoking Status Former Smoker  . Packs/day: 1.00  . Years: 32.00  . Pack years: 32.00  . Types: Cigarettes  Smokeless Tobacco Never Used  Tobacco Comment   Quit around age 18    Labs: Recent Review Flowsheet Data    Labs for ITP Cardiac and Pulmonary Rehab Latest Ref Rng & Units 06/01/2017 06/02/2017   Cholestrol 0 - 200 mg/dL - 171   LDLCALC 0 - 99 mg/dL - 116(H)   HDL >40 mg/dL - 34(L)   Trlycerides <150 mg/dL - 106   Hemoglobin A1c 4.8 - 5.6 % 9.1(H) -      Capillary Blood Glucose: Lab Results  Component Value Date   GLUCAP 239 (H) 06/03/2017   GLUCAP 152 (H) 06/03/2017   GLUCAP 151 (H) 06/02/2017   GLUCAP 156 (H) 06/02/2017  GLUCAP 131 (H) 06/02/2017     Exercise Target Goals: Date: 07/24/17  Exercise Program Goal: Individual exercise prescription set using results from initial 6 min walk test and THRR while considering  patient's activity barriers and safety.   Exercise Prescription Goal: Starting with aerobic activity 30 plus minutes a day, 3 days per week for initial exercise prescription. Provide home exercise prescription and guidelines that participant acknowledges understanding prior to discharge.  Activity Barriers & Risk Stratification: Activity Barriers & Cardiac Risk Stratification - 07/24/17 1013      Activity Barriers & Cardiac Risk Stratification   Activity Barriers  Back Problems;Deconditioning;Other (comment) Chronic  (L) knee paing that flares up from time to time.    Comments  Lightheadedness during walk test. He states he does not experience being light headed at home.     Cardiac Risk Stratification  High       6 Minute Walk: 6 Minute Walk    Row Name 07/24/17 1012         6 Minute Walk   Phase  Initial     Distance  1250 feet     Distance % Change  0 %     Distance Feet Change  0 ft     Walk Time  6 minutes     # of Rest Breaks  0     MPH  2.36     METS  2.81     RPE  13     Perceived Dyspnea   11     VO2 Peak  6.23     Symptoms  Yes (comment)     Comments  Lightheadedness     Resting HR  57 bpm     Resting BP  132/62     Resting Oxygen Saturation   100 %     Exercise Oxygen Saturation  during 6 min walk  89 %     Max Ex. HR  83 bpm     Max Ex. BP  148/68     2 Minute Post BP  136/64        Oxygen Initial Assessment:   Oxygen Re-Evaluation:   Oxygen Discharge (Final Oxygen Re-Evaluation):   Initial Exercise Prescription: Initial Exercise Prescription - 07/24/17 1000      Date of Initial Exercise RX and Referring Provider   Date  07/24/17    Referring Provider  Dr. Stanford Breed      Treadmill   MPH  1.2    Grade  0    Minutes  15    METs  1.9      NuStep   Level  0    SPM  72    Minutes  20    METs  1.5      Prescription Details   Frequency (times per week)  3    Duration  Progress to 30 minutes of continuous aerobic without signs/symptoms of physical distress      Intensity   THRR 40-80% of Max Heartrate  [id number]    Ratings of Perceived Exertion  11-13    Perceived Dyspnea  0-4      Progression   Progression  Continue progressive overload as per policy without signs/symptoms or physical distress.      Resistance Training   Training Prescription  Yes    Weight  1    Reps  10-15       Perform Capillary Blood Glucose checks as needed.  Exercise Prescription Changes:  Exercise Comments:   Exercise Goals and Review:  Exercise Goals     Row Name 07/24/17 1015             Exercise Goals   Increase Physical Activity  Yes       Intervention  Provide advice, education, support and counseling about physical activity/exercise needs.;Develop an individualized exercise prescription for aerobic and resistive training based on initial evaluation findings, risk stratification, comorbidities and participant's personal goals.       Expected Outcomes  Short Term: Attend rehab on a regular basis to increase amount of physical activity.       Increase Strength and Stamina  Yes       Intervention  Provide advice, education, support and counseling about physical activity/exercise needs.;Develop an individualized exercise prescription for aerobic and resistive training based on initial evaluation findings, risk stratification, comorbidities and participant's personal goals.       Expected Outcomes  Short Term: Increase workloads from initial exercise prescription for resistance, speed, and METs.       Able to understand and use rate of perceived exertion (RPE) scale  Yes       Intervention  Provide education and explanation on how to use RPE scale       Expected Outcomes  Short Term: Able to use RPE daily in rehab to express subjective intensity level;Long Term:  Able to use RPE to guide intensity level when exercising independently       Able to understand and use Dyspnea scale  Yes       Intervention  Provide education and explanation on how to use Dyspnea scale       Expected Outcomes  Short Term: Able to use Dyspnea scale daily in rehab to express subjective sense of shortness of breath during exertion;Long Term: Able to use Dyspnea scale to guide intensity level when exercising independently       Knowledge and understanding of Target Heart Rate Range (THRR)  Yes       Intervention  Provide education and explanation of THRR including how the numbers were predicted and where they are located for reference       Expected Outcomes  Short Term:  Able to state/look up THRR;Long Term: Able to use THRR to govern intensity when exercising independently;Short Term: Able to use daily as guideline for intensity in rehab       Able to check pulse independently  Yes       Intervention  Provide education and demonstration on how to check pulse in carotid and radial arteries.;Review the importance of being able to check your own pulse for safety during independent exercise       Expected Outcomes  Short Term: Able to explain why pulse checking is important during independent exercise;Long Term: Able to check pulse independently and accurately       Understanding of Exercise Prescription  Yes       Intervention  Provide education, explanation, and written materials on patient's individual exercise prescription       Expected Outcomes  Short Term: Able to explain program exercise prescription;Long Term: Able to explain home exercise prescription to exercise independently          Exercise Goals Re-Evaluation :    Discharge Exercise Prescription (Final Exercise Prescription Changes):   Nutrition:  Target Goals: Understanding of nutrition guidelines, daily intake of sodium 1500mg , cholesterol 200mg , calories 30% from fat and 7% or less from saturated fats, daily to have 5 or  more servings of fruits and vegetables.  Biometrics: Pre Biometrics - 07/24/17 1015      Pre Biometrics   Height  5\' 7"  (1.702 m)    Weight  186 lb 9.6 oz (84.6 kg)    Waist Circumference  19 inches    Hip Circumference  19 inches    Waist to Hip Ratio  1 %    BMI (Calculated)  29.22    Triceps Skinfold  10 mm    % Body Fat  26.7 %    Grip Strength  70.3 kg    Flexibility  0 in    Single Leg Stand  3 seconds        Nutrition Therapy Plan and Nutrition Goals: Nutrition Therapy & Goals - 07/24/17 1126      Personal Nutrition Goals   Personal Goal #2  Patient has been eating a diabetic diet for years. He has added low sodium diet since recent heart event along  with heart healthy diet.     Additional Goals?  No       Nutrition Assessments: Nutrition Assessments - 07/24/17 1127      MEDFICTS Scores   Pre Score  68       Nutrition Goals Re-Evaluation:   Nutrition Goals Discharge (Final Nutrition Goals Re-Evaluation):   Psychosocial: Target Goals: Acknowledge presence or absence of significant depression and/or stress, maximize coping skills, provide positive support system. Participant is able to verbalize types and ability to use techniques and skills needed for reducing stress and depression.  Initial Review & Psychosocial Screening: Initial Psych Review & Screening - 07/24/17 1135      Initial Review   Current issues with  None Identified      Family Dynamics   Good Support System?  Yes      Barriers   Psychosocial barriers to participate in program  There are no identifiable barriers or psychosocial needs.      Screening Interventions   Interventions  Encouraged to exercise    Expected Outcomes  Short Term goal: Identification and review with participant of any Quality of Life or Depression concerns found by scoring the questionnaire.;Long Term goal: The participant improves quality of Life and PHQ9 Scores as seen by post scores and/or verbalization of changes       Quality of Life Scores: Quality of Life - 07/24/17 1016      Quality of Life Scores   Health/Function Pre  21.37 %    Socioeconomic Pre  25 %    Psych/Spiritual Pre  22.75 %    Family Pre  25.9 %    GLOBAL Pre  23.02 %      Scores of 19 and below usually indicate a poorer quality of life in these areas.  A difference of  2-3 points is a clinically meaningful difference.  A difference of 2-3 points in the total score of the Quality of Life Index has been associated with significant improvement in overall quality of life, self-image, physical symptoms, and general health in studies assessing change in quality of life.  PHQ-9: Recent Review Flowsheet Data     Depression screen [facility] 2/9 07/24/2017   Decreased Interest 0   Down, Depressed, Hopeless 0   PHQ - 2 Score 0   Altered sleeping 0   Tired, decreased energy 1   Change in appetite 0   Feeling bad or failure about yourself  0   Trouble concentrating 0   Moving slowly or fidgety/restless 0  Suicidal thoughts 0   PHQ-9 Score 1   Difficult doing work/chores Somewhat difficult     Interpretation of Total Score  Total Score Depression Severity:  1-4 = Minimal depression, 5-9 = Mild depression, 10-14 = Moderate depression, 15-19 = Moderately severe depression, 20-27 = Severe depression   Psychosocial Evaluation and Intervention: Psychosocial Evaluation - 07/24/17 1136      Psychosocial Evaluation & Interventions   Interventions  Encouraged to exercise with the program and follow exercise prescription    Continue Psychosocial Services   No Follow up required       Psychosocial Re-Evaluation:   Psychosocial Discharge (Final Psychosocial Re-Evaluation):   Vocational Rehabilitation: Provide vocational rehab assistance to qualifying candidates.   Vocational Rehab Evaluation & Intervention: Vocational Rehab - 07/24/17 1118      Initial Vocational Rehab Evaluation & Intervention   Assessment shows need for Vocational Rehabilitation  No       Education: Education Goals: Education classes will be provided on a weekly basis, covering required topics. Participant will state understanding/return demonstration of topics presented.  Learning Barriers/Preferences: Learning Barriers/Preferences - 07/24/17 1117      Learning Barriers/Preferences   Learning Barriers  None    Learning Preferences  Written Material;Pictoral;Video       Education Topics: Hypertension, Hypertension Reduction -Define heart disease and high blood pressure. Discus how high blood pressure affects the body and ways to reduce high blood pressure.   Exercise and Your Heart -Discuss why it is important to  exercise, the FITT principles of exercise, normal and abnormal responses to exercise, and how to exercise safely.   Angina -Discuss definition of angina, causes of angina, treatment of angina, and how to decrease risk of having angina.   Cardiac Medications -Review what the following cardiac medications are used for, how they affect the body, and side effects that may occur when taking the medications.  Medications include Aspirin, Beta blockers, calcium channel blockers, ACE Inhibitors, angiotensin receptor blockers, diuretics, digoxin, and antihyperlipidemics.   Congestive Heart Failure -Discuss the definition of CHF, how to live with CHF, the signs and symptoms of CHF, and how keep track of weight and sodium intake.   Heart Disease and Intimacy -Discus the effect sexual activity has on the heart, how changes occur during intimacy as we age, and safety during sexual activity.   Smoking Cessation / COPD -Discuss different methods to quit smoking, the health benefits of quitting smoking, and the definition of COPD.   Nutrition I: Fats -Discuss the types of cholesterol, what cholesterol does to the heart, and how cholesterol levels can be controlled.   Nutrition II: Labels -Discuss the different components of food labels and how to read food label   Heart Parts/Heart Disease and PAD -Discuss the anatomy of the heart, the pathway of blood circulation through the heart, and these are affected by heart disease.   Stress I: Signs and Symptoms -Discuss the causes of stress, how stress may lead to anxiety and depression, and ways to limit stress.   Stress II: Relaxation -Discuss different types of relaxation techniques to limit stress.   Warning Signs of Stroke / TIA -Discuss definition of a stroke, what the signs and symptoms are of a stroke, and how to identify when someone is having stroke.   Knowledge Questionnaire Score: Knowledge Questionnaire Score - 07/24/17 1118       Knowledge Questionnaire Score   Pre Score  20/24       Core Components/Risk Factors/Patient Goals at  Admission: Personal Goals and Risk Factors at Admission - 07/24/17 1127      Core Components/Risk Factors/Patient Goals on Admission    Weight Management  Weight Maintenance    Personal Goal Other  Yes    Personal Goal  Get back to normal ADL's, have less back pain and to be more mobile    Intervention  Attend CR 3 x week and supplement with home exercise 2 x week.     Expected Outcomes  Achieve personal goals       Core Components/Risk Factors/Patient Goals Review:    Core Components/Risk Factors/Patient Goals at Discharge (Final Review):    ITP Comments: ITP Comments    Row Name 07/24/17 1120           ITP Comments  Mr. Stawicki is a pleasant 84 year old gentleman. He has had NSTEMI with stent placment. He also c/o have (L) knee pain, and back pain. he states that he still has a heart blockage that his cardilogist is watching. His wife is very supportive and patient is eager to get started.           Comments: Patient arrived for 1st visit/orientation/education at 0800. Patient was referred to CR by Dr. Stanford Breed due to NSTEMI (I21.4) and Stent Placement (Z95.5). During orientation advised patient on arrival and appointment times what to wear, what to do before, during and after exercise. Reviewed attendance and class policy. Talked about inclement weather and class consultation policy. Pt is scheduled to return Cardiac Rehab on 07/27/17 at 0930. Pt was advised to come to class 15 minutes before class starts. Patient was also given instructions on meeting with the dietician and attending the Family Structure classes. Discussed RPE/Dpysnea scales. Discussed initial THR and how to find their radial and/or carotid pulse. Discussed the initial exercise prescription and how this effects their progress. Pt is eager to get started. Patient participated in warm up stretches followed by  light weights and resistance bands. Patient was able to complete 6 minute walk test. Patient c/o (L) knee pain in 5/10. Pain subsided to 0/10 at 2 minute rest. Pain was completely gone at the end of orientation. Patient was measured for the equipment. Discussed equipment safety with patient. Took patient pre-anthropometric measurements. Patient finished visit at 10:30.

## 2017-07-27 ENCOUNTER — Encounter (HOSPITAL_COMMUNITY)
Admission: RE | Admit: 2017-07-27 | Discharge: 2017-07-27 | Disposition: A | Discharge status: Home / Self Care | Payer: Medicare Other | Source: Ambulatory Visit | Attending: Cardiology | Admitting: Cardiology

## 2017-07-27 DIAGNOSIS — Z955 Presence of coronary angioplasty implant and graft: Secondary | ICD-10-CM

## 2017-07-27 DIAGNOSIS — Z48812 Encounter for surgical aftercare following surgery on the circulatory system: Secondary | ICD-10-CM | POA: Diagnosis not present

## 2017-07-27 DIAGNOSIS — I214 Non-ST elevation (NSTEMI) myocardial infarction: Secondary | ICD-10-CM

## 2017-07-27 NOTE — Progress Notes (Signed)
Patient recieved the take home exercise plan today. THr was addressed as were safety guidelines for being active when not in CR. Patient demonstrated an understanding and was encouraged to ask any future questions as they arise.

## 2017-07-27 NOTE — Progress Notes (Signed)
Daily Session Note  Patient Details  Name: Erik Logan MRN: [id number] Date of Birth: 18-Apr-1928 Referring Provider:     CARDIAC REHAB PHASE II ORIENTATION from 07/24/2017 in [place]  Referring Provider  Dr. Stanford Breed      Encounter Date: 07/27/2017  Check In: Session Check In - 07/27/17 0933      Check-In   Location  [facility]    Staff Present  Diane Angelina Pih, MS, EP, [facility], Exercise Physiologist;Cailin Gebel Luther Parody, BS, EP, Exercise Physiologist;Debra Wynetta Emery, RN, BSN    Supervising physician immediately available to respond to emergencies  See telemetry face sheet for immediately available MD    Medication changes reported      No    Fall or balance concerns reported     No    Warm-up and Cool-down  Performed as group-led instruction    Resistance Training Performed  Yes    VAD Patient?  No      Pain Assessment   Currently in Pain?  No/denies    Pain Score  0-No pain    Multiple Pain Sites  No       Capillary Blood Glucose: No results found for this or any previous visit (from the past 24 hour(s)).    Social History   Tobacco Use  Smoking Status Former Smoker  . Packs/day: 1.00  . Years: 32.00  . Pack years: 32.00  . Types: Cigarettes  Smokeless Tobacco Never Used  Tobacco Comment   Quit around age 89    Goals Met:  Independence with exercise equipment Exercise tolerated well No report of cardiac concerns or symptoms Strength training completed today  Goals Unmet:  Not Applicable  Comments: Check out 1030   Dr. Kate Sable is Medical Director for [place] and Pulmonary Rehab.

## 2017-07-29 ENCOUNTER — Ambulatory Visit (INDEPENDENT_AMBULATORY_CARE_PROVIDER_SITE_OTHER): Payer: Medicare Other | Admitting: Cardiology

## 2017-07-29 ENCOUNTER — Encounter: Payer: Self-pay | Admitting: *Deleted

## 2017-07-29 ENCOUNTER — Encounter (HOSPITAL_COMMUNITY)
Admission: RE | Admit: 2017-07-29 | Discharge: 2017-07-29 | Disposition: A | Discharge status: Home / Self Care | Payer: Medicare Other | Source: Ambulatory Visit | Attending: Cardiology | Admitting: Cardiology

## 2017-07-29 ENCOUNTER — Encounter: Payer: Self-pay | Admitting: Cardiology

## 2017-07-29 ENCOUNTER — Telehealth (INDEPENDENT_AMBULATORY_CARE_PROVIDER_SITE_OTHER): Payer: Medicare Other | Admitting: Cardiology

## 2017-07-29 VITALS — BP 130/60 | HR 63 | Wt 184.0 lb

## 2017-07-29 DIAGNOSIS — R946 Abnormal results of thyroid function studies: Secondary | ICD-10-CM | POA: Diagnosis not present

## 2017-07-29 DIAGNOSIS — I214 Non-ST elevation (NSTEMI) myocardial infarction: Secondary | ICD-10-CM

## 2017-07-29 DIAGNOSIS — R0602 Shortness of breath: Secondary | ICD-10-CM | POA: Diagnosis not present

## 2017-07-29 DIAGNOSIS — R42 Dizziness and giddiness: Secondary | ICD-10-CM

## 2017-07-29 DIAGNOSIS — Z955 Presence of coronary angioplasty implant and graft: Secondary | ICD-10-CM | POA: Diagnosis not present

## 2017-07-29 DIAGNOSIS — I2510 Atherosclerotic heart disease of native coronary artery without angina pectoris: Secondary | ICD-10-CM

## 2017-07-29 DIAGNOSIS — N184 Chronic kidney disease, stage 4 (severe): Secondary | ICD-10-CM | POA: Diagnosis not present

## 2017-07-29 DIAGNOSIS — Z48812 Encounter for surgical aftercare following surgery on the circulatory system: Secondary | ICD-10-CM | POA: Diagnosis not present

## 2017-07-29 MED ORDER — CLOPIDOGREL BISULFATE 75 MG PO TABS: ORAL_TABLET | ORAL | 1 refills | Status: DC

## 2017-07-29 MED ORDER — FUROSEMIDE 40 MG PO TABS: ORAL_TABLET | ORAL | 1 refills | Status: DC

## 2017-07-29 NOTE — Telephone Encounter (Signed)
Orthostatic BP  Laying 132/60 HR 59 Sitting 128/62 HR 71 Standing 138/60 HR 67 Standing 3 mins 140/60 HR 67

## 2017-07-29 NOTE — Telephone Encounter (Signed)
Pt says for the last couple of days has been dizzy/weak/lightheadeness - came from Cardiac rehab to office - swelling in legs and ankles +2 pitting and weight 184lbs - BP in office 130/60 HR 63 o2 98% - will forward to Dr Harl Bowie

## 2017-07-29 NOTE — Patient Instructions (Signed)
Your physician recommends that you schedule a follow-up appointment in: [place] IN June  Your physician has recommended you make the following change in your medication:   STOP BRILINTA  STOP HYDRALAZINE   DECREASE LASIX 20 MG DAILY - MAY TAKE ADDITIONAL 40 MG AS NEEDED FOR SWELLING  START PLAVIX 300 MG (4 TABLETS) ON DAY 1 THEN TAKE 1 TABLET 75 MG DAILY   Thank you for choosing [facility]!!

## 2017-07-29 NOTE — Progress Notes (Signed)
Daily Session Note  Patient Details  Name: Erik Logan MRN: [id number] Date of Birth: 12-04-1928 Referring Provider:     CARDIAC REHAB PHASE II ORIENTATION from 07/24/2017 in [place]  Referring Provider  Dr. Stanford Breed      Encounter Date: 07/29/2017  Check In: Session Check In - 07/29/17 0930      Check-In   Location  [facility]    Staff Present  Suzanne Boron, BS, EP, Exercise Physiologist;Hiroko Tregre Wynetta Emery, RN, BSN    Supervising physician immediately available to respond to emergencies  See telemetry face sheet for immediately available MD    Medication changes reported      No    Fall or balance concerns reported     No    Warm-up and Cool-down  Performed as group-led instruction    Resistance Training Performed  Yes    VAD Patient?  No      Pain Assessment   Currently in Pain?  No/denies    Pain Score  0-No pain    Multiple Pain Sites  No       Capillary Blood Glucose: No results found for this or any previous visit (from the past 24 hour(s)).    Social History   Tobacco Use  Smoking Status Former Smoker  . Packs/day: 1.00  . Years: 32.00  . Pack years: 32.00  . Types: Cigarettes  Smokeless Tobacco Never Used  Tobacco Comment   Quit around age 89    Goals Met:  Independence with exercise equipment Exercise tolerated well Patient complained of feeling lightheaded today on the treadmill. He says he has been feeling this way for the past several days. Patent moved to nustep. He says he will contact his doctor about this. Staff will follow up.  Goals Unmet:  Not Applicable  Comments: Check out 1030.   Dr. Kate Sable is Medical Director for [facility] Cardiac and Pulmonary Rehab.

## 2017-07-29 NOTE — Telephone Encounter (Signed)
Pt added to Dr Nelly Laurence schedule

## 2017-07-29 NOTE — Telephone Encounter (Signed)
Patient walk in   Stated he was told to come to our office after becomign dizzy and lightheaded at rehab at [facility].

## 2017-07-29 NOTE — Progress Notes (Signed)
Clinical Summary Erik Logan is a 84 y.o.male seen for hospital follow up, this is our first visit together. This is an add on visit for lightheadedness/dizziness.    1. Lightheadness/dizziness/HTN/LE edema - mainly occurs with standing. - recently developed some LE edema, he was started on lasix at that time.  - of note the swelling started shortly after starting hydralazine for his bp.    Past Medical History:  Diagnosis Date  . Chronic renal insufficiency, stage 2 (mild)   . Coronary artery disease   . History of duodenal ulcer 1970s  . Hyperlipidemia   . Hypertension   . NSTEMI (non-ST elevated myocardial infarction) ([facility]) 06/01/2017  . Type II diabetes mellitus ([facility])      Allergies  Allergen Reactions  . Beef-Derived Products Hives    Red meat  . Sulfa Antibiotics Swelling     Current Outpatient Medications  Medication Sig Dispense Refill  . amLODipine (NORVASC) 10 MG tablet Take 1 tablet (10 mg total) by mouth daily. 180 tablet 3  . aspirin EC 81 MG tablet Take 1 tablet (81 mg total) by mouth daily.    Marland Kitchen atorvastatin (LIPITOR) 80 MG tablet Take 1 tablet (80 mg total) by mouth daily at 6 PM. 30 tablet 6  . furosemide (LASIX) 40 MG tablet Take 1 tablet (40 mg total) by mouth daily. 90 tablet 1  . hydrALAZINE (APRESOLINE) 25 MG tablet Take 1 tablet (25 mg total) by mouth 3 (three) times daily. 270 tablet 1  . insulin lispro (HUMALOG) 100 UNIT/ML injection Inject 5 Units into the skin See admin instructions. Per Sliding Scale    . isosorbide mononitrate (IMDUR) 30 MG 24 hr tablet Take 1 tablet (30 mg total) by mouth daily. 30 tablet 6  . metoprolol tartrate (LOPRESSOR) 25 MG tablet Take 0.5 tablets (12.5 mg total) by mouth daily. 30 tablet 6  . nitroGLYCERIN (NITROSTAT) 0.4 MG SL tablet Place 1 tablet (0.4 mg total) under the tongue every 5 (five) minutes x 3 doses as needed for chest pain. 25 tablet 12  . Polyethyl Glycol-Propyl Glycol (SYSTANE OP) Apply 2 drops to  eye daily as needed (Dry eyes).    . ticagrelor (BRILINTA) 90 MG TABS tablet Take 1 tablet (90 mg total) by mouth 2 (two) times daily. 24 tablet 0  . TRESIBA FLEXTOUCH 100 UNIT/ML SOPN FlexTouch Pen Inject 25 Units into the skin every morning.      No current facility-administered medications for this visit.      Past Surgical History:  Procedure Laterality Date  . CATARACT EXTRACTION W/ INTRAOCULAR LENS IMPLANT Left   . CORONARY ANGIOPLASTY WITH STENT PLACEMENT  06/02/2017  . CORONARY BALLOON ANGIOPLASTY N/A 06/02/2017   Procedure: CORONARY BALLOON ANGIOPLASTY;  Surgeon: Troy Sine, MD;  Location: [facility];  Service: Cardiovascular;  Laterality: N/A;  . CORONARY STENT INTERVENTION N/A 06/02/2017   Procedure: CORONARY STENT INTERVENTION;  Surgeon: Troy Sine, MD;  Location: [facility];  Service: Cardiovascular;  Laterality: N/A;  . EYE SURGERY Right 1950s   "S/P injury in ~ 1942 which ; put a hole in the retina; can't see out of it; cataract developed; surgery to remove cataract in 1950  . LEFT HEART CATH AND CORONARY ANGIOGRAPHY N/A 06/02/2017   Procedure: LEFT HEART CATH AND CORONARY ANGIOGRAPHY;  Surgeon: Troy Sine, MD;  Location: [facility];  Service: Cardiovascular;  Laterality: N/A;  . TONSILLECTOMY  1930s  Allergies  Allergen Reactions  . Beef-Derived Products Hives    Red meat  . Sulfa Antibiotics Swelling      Family History  Problem Relation Age of Onset  . Diabetes Mother   . Hypertension Mother   . Pulmonary disease Father   . Pulmonary disease Sister   . Healthy Sister      Social History Erik Logan reports that he has quit smoking. His smoking use included cigarettes. He has a 32.00 pack-year smoking history. He has never used smokeless tobacco. Erik Logan reports that he does not drink alcohol.   Review of Systems CONSTITUTIONAL: No weight loss, fever, chills, weakness or fatigue.  HEENT: Eyes: No visual loss,  blurred vision, double vision or yellow sclerae.No hearing loss, sneezing, congestion, runny nose or sore throat.  SKIN: No rash or itching.  CARDIOVASCULAR: no chest pain, no palpitations.  RESPIRATORY: No shortness of breath, cough or sputum.  GASTROINTESTINAL: No anorexia, nausea, vomiting or diarrhea. No abdominal pain or blood.  GENITOURINARY: No burning on urination, no polyuria NEUROLOGICAL: +dizziness MUSCULOSKELETAL: No muscle, back pain, joint pain or stiffness.  LYMPHATICS: No enlarged nodes. No history of splenectomy.  PSYCHIATRIC: No history of depression or anxiety.  ENDOCRINOLOGIC: No reports of sweating, cold or heat intolerance. No polyuria or polydipsia.  Marland Kitchen   Physical Examination Vitals:   07/29/17 1158  BP: 130/60  Pulse: 63  SpO2: 98%   Vitals:   07/29/17 1158  Weight: 184 lb (83.5 kg)    Gen: resting comfortably, no acute distress HEENT: no scleral icterus, pupils equal round and reactive, no palptable cervical adenopathy,  CV: RRR, no m/r/g no jvd Resp: Clear to auscultation bilaterally GI: abdomen is soft, non-tender, non-distended, normal bowel sounds, no hepatosplenomegaly MSK: extremities are warm, 1+ bilateral LE edema Skin: warm, no rash Neuro:  no focal deficits Psych: appropriate affect   Diagnostic Studies  05/2017 cath  Prox RCA lesion is 95% stenosed.  Prox RCA to Mid RCA lesion is 50% stenosed.  Ramus lesion is 50% stenosed.  Ost 2nd Diag to 2nd Diag lesion is 100% stenosed.  RPDA lesion is 90% stenosed.  Balloon angioplasty was performed using a BALLOON SAPPHIRE 2.0X12.  Post intervention, there is a 0% residual stenosis.  Dist RCA lesion is 90% stenosed.  Post intervention, there is a 0% residual stenosis.  A stent was successfully placed.  A stent was successfully placed.  Post intervention, there is a 0% residual stenosis.  Post intervention, there is a 0% residual stenosis.  Ost 1st Diag to 1st Diag lesion is 85%  stenosed.   Multi-vessel CAD with 85% long stenosis in the first diagonal [place] of the LAD, initial total occlusion of the second diagonal vessel with later mild opening to 99%, 50% stenosis in the ramus intermediate vessel; normal left circumflex vessel; and diffusely diseased dominant RCA with 95% proximal stenosis followed by diffuse irregularity of 50%, 90% focal stenosis distally beyond the acute margin prior to the PDA, and focal 90% mid PDA stenosis.  LVEDP 17 mm.  Successful multi lesion PCI to the RCA with PTCA of the mid PDA 90% stenosis reduced to 0%; PCI/DES stenting of the 90% distal RCA stenosis treated with a 2.515 mm Resolute stent post dilated to 2.7 mm with the stenosis being reduced to 0%, and insertion of a 3.026 mm Resolute stent to cover the 95 and 50% proximal to mid RCA stenoses, post dilated up to 3.2 mm with the stenoses being reduced to 0%.  RECOMMENDATION: Echo Doppler evaluation for LV function assessment.  If the patient develops recurrent chest pain symptomatology, consider possible PCI to the second diagonal vessel.  Otherwise, consider medical therapy with beta blocker, nitrates.  Recommended dual antiplatelet therapy for minimum of 1 year.  Aggressive lipid intervention.  05/2017 echo  Study Conclusions  - Left ventricle: The cavity size was normal. Wall thickness was   increased in a pattern of mild LVH. Systolic function was normal.   The estimated ejection fraction was in the range of 55% to 60%.   Wall motion was normal; there were no regional wall motion   abnormalities. Doppler parameters are consistent with abnormal   left ventricular relaxation (grade 1 diastolic dysfunction). - Mitral valve: Mildly calcified annulus. Valve area by pressure   half-time: 1.85 cm^2.    Assessment and Plan   1. Dizziness - primarily orthostatic in description, though orthostatics are not specificlaly abnormal - suspect related to recently starting lasix for LE  edema. Of not his LE edema started shortly after starting hydralazine.  - we will stop hydralazine, lower lasix to 20mg  daily may take additional 40mg  as needed for swelling.   2. SOB - suspect related to brillinta, we will d/c. Load with plavix 300mg  daily and then take 75mg  daily.       Arnoldo Lenis, M.D

## 2017-07-31 ENCOUNTER — Encounter (HOSPITAL_COMMUNITY)
Admission: RE | Admit: 2017-07-31 | Discharge: 2017-07-31 | Disposition: A | Discharge status: Home / Self Care | Payer: Medicare Other | Source: Ambulatory Visit | Attending: Cardiology | Admitting: Cardiology

## 2017-07-31 DIAGNOSIS — Z48812 Encounter for surgical aftercare following surgery on the circulatory system: Secondary | ICD-10-CM | POA: Diagnosis not present

## 2017-07-31 DIAGNOSIS — Z955 Presence of coronary angioplasty implant and graft: Secondary | ICD-10-CM

## 2017-07-31 DIAGNOSIS — I214 Non-ST elevation (NSTEMI) myocardial infarction: Secondary | ICD-10-CM

## 2017-07-31 NOTE — Progress Notes (Signed)
Daily Session Note  Patient Details  Name: Erik Logan MRN: [id number] Date of Birth: 06-25-1928 Referring Provider:     CARDIAC REHAB PHASE II ORIENTATION from 07/24/2017 in [place]  Referring Provider  Dr. Stanford Breed      Encounter Date: 07/31/2017  Check In: Session Check In - 07/31/17 0944      Check-In   Location  [facility]    Staff Present  Suzanne Boron, BS, EP, Exercise Physiologist;Debra Wynetta Emery, RN, BSN    Supervising physician immediately available to respond to emergencies  See telemetry face sheet for immediately available MD    Medication changes reported      No    Fall or balance concerns reported     No    Warm-up and Cool-down  Performed as group-led instruction    Resistance Training Performed  Yes    VAD Patient?  No      Pain Assessment   Currently in Pain?  No/denies    Pain Score  0-No pain    Multiple Pain Sites  No       Capillary Blood Glucose: No results found for this or any previous visit (from the past 24 hour(s)).  Exercise Prescription Changes - 07/30/17 1500      Response to Exercise   Blood Pressure (Admit)  120/60    Blood Pressure (Exercise)  144/60    Blood Pressure (Exit)  130/50    Heart Rate (Admit)  62 bpm    Heart Rate (Exercise)  84 bpm    Heart Rate (Exit)  70 bpm    Rating of Perceived Exertion (Exercise)  11    Duration  Progress to 30 minutes of  aerobic without signs/symptoms of physical distress    Intensity  THRR New [id number]      Progression   Progression  Continue to progress workloads to maintain intensity without signs/symptoms of physical distress.      Resistance Training   Training Prescription  Yes    Weight  1    Reps  10-15      Treadmill   MPH  1.2    Grade  0    Minutes  15    METs  1.9      NuStep   Level  1    SPM  87    Minutes  20    METs  2.4      Home Exercise Plan   Plans to continue exercise at  Home (comment)    Frequency  Add 2  additional days to program exercise sessions.    Initial Home Exercises Provided  07/27/17       Social History   Tobacco Use  Smoking Status Former Smoker  . Packs/day: 1.00  . Years: 32.00  . Pack years: 32.00  . Types: Cigarettes  Smokeless Tobacco Never Used  Tobacco Comment   Quit around age 37    Goals Met:  Independence with exercise equipment Exercise tolerated well No report of cardiac concerns or symptoms Strength training completed today  Goals Unmet:  Not Applicable  Comments: Check out 1030   Dr. Kate Sable is Medical Director for [facility].

## 2017-08-03 ENCOUNTER — Encounter (HOSPITAL_COMMUNITY)
Admission: RE | Admit: 2017-08-03 | Discharge: 2017-08-03 | Disposition: A | Discharge status: Home / Self Care | Payer: Medicare Other | Source: Ambulatory Visit | Attending: Cardiology | Admitting: Cardiology

## 2017-08-03 DIAGNOSIS — Z955 Presence of coronary angioplasty implant and graft: Secondary | ICD-10-CM

## 2017-08-03 DIAGNOSIS — I214 Non-ST elevation (NSTEMI) myocardial infarction: Secondary | ICD-10-CM | POA: Diagnosis not present

## 2017-08-03 DIAGNOSIS — Z48812 Encounter for surgical aftercare following surgery on the circulatory system: Secondary | ICD-10-CM | POA: Diagnosis not present

## 2017-08-03 NOTE — Progress Notes (Signed)
Cardiac Individual Treatment Plan  Patient Details  Name: Erik Logan MRN: [id number] Date of Birth: [birth year] Referring Provider:     CARDIAC REHAB PHASE II ORIENTATION from 07/24/2017 in [place]  Referring Provider  Dr. Stanford Breed      Initial Encounter Date:    CARDIAC REHAB PHASE II ORIENTATION from 07/24/2017 in [place]  Date  07/24/17  Referring Provider  Dr. Stanford Breed      Visit Diagnosis: NSTEMI (non-ST elevated myocardial infarction) [facility])  Status post coronary artery stent placement  Patient's Home Medications on Admission:  Current Outpatient Medications:  .  amLODipine (NORVASC) 10 MG tablet, Take 1 tablet (10 mg total) by mouth daily., Disp: 180 tablet, Rfl: 3 .  aspirin EC 81 MG tablet, Take 1 tablet (81 mg total) by mouth daily., Disp: , Rfl:  .  atorvastatin (LIPITOR) 80 MG tablet, Take 1 tablet (80 mg total) by mouth daily at 6 PM., Disp: 30 tablet, Rfl: 6 .  clopidogrel (PLAVIX) 75 MG tablet, TAKE 4 TABLETS 300MG  DAY ONE THEN TAKE 1 TABLET 75 MG DAILY, Disp: 94 tablet, Rfl: 1 .  furosemide (LASIX) 40 MG tablet, TAKE 20 MG DAILY MAY TAKE 40 MG AS NEEDED FOR SWELLING, Disp: 135 tablet, Rfl: 1 .  insulin lispro (HUMALOG) 100 UNIT/ML injection, Inject 5 Units into the skin See admin instructions. Per Sliding Scale, Disp: , Rfl:  .  isosorbide mononitrate (IMDUR) 30 MG 24 hr tablet, Take 1 tablet (30 mg total) by mouth daily., Disp: 30 tablet, Rfl: 6 .  metoprolol tartrate (LOPRESSOR) 25 MG tablet, Take 0.5 tablets (12.5 mg total) by mouth daily., Disp: 30 tablet, Rfl: 6 .  nitroGLYCERIN (NITROSTAT) 0.4 MG SL tablet, Place 1 tablet (0.4 mg total) under the tongue every 5 (five) minutes x 3 doses as needed for chest pain., Disp: 25 tablet, Rfl: 12 .  Polyethyl Glycol-Propyl Glycol (SYSTANE OP), Apply 2 drops to eye daily as needed (Dry eyes)., Disp: , Rfl:  .  TRESIBA FLEXTOUCH 100 UNIT/ML SOPN FlexTouch Pen, Inject 25  Units into the skin every morning. , Disp: , Rfl:   Past Medical History: Past Medical History:  Diagnosis Date  . Chronic renal insufficiency, stage 2 (mild)   . Coronary artery disease   . History of duodenal ulcer 1970s  . Hyperlipidemia   . Hypertension   . NSTEMI (non-ST elevated myocardial infarction) ([place]) 06/01/2017  . Type II diabetes mellitus (HCC)     Tobacco Use: Social History   Tobacco Use  Smoking Status Former Smoker  . Packs/day: 1.00  . Years: 32.00  . Pack years: 32.00  . Types: Cigarettes  Smokeless Tobacco Never Used  Tobacco Comment   Quit around age 27    Labs: Recent Review Flowsheet Data    Labs for ITP Cardiac and Pulmonary Rehab Latest Ref Rng & Units 06/01/2017 06/02/2017   Cholestrol 0 - 200 mg/dL - 171   LDLCALC 0 - 99 mg/dL - 116(H)   HDL >40 mg/dL - 34(L)   Trlycerides <150 mg/dL - 106   Hemoglobin A1c 4.8 - 5.6 % 9.1(H) -      Capillary Blood Glucose: Lab Results  Component Value Date   GLUCAP 239 (H) 06/03/2017   GLUCAP 152 (H) 06/03/2017   GLUCAP 151 (H) 06/02/2017   GLUCAP 156 (H) 06/02/2017   GLUCAP 131 (H) 06/02/2017     Exercise Target Goals:    Exercise Program Goal: Individual exercise prescription set  using results from initial 6 min walk test and THRR while considering  patient's activity barriers and safety.   Exercise Prescription Goal: Starting with aerobic activity 30 plus minutes a day, 3 days per week for initial exercise prescription. Provide home exercise prescription and guidelines that participant acknowledges understanding prior to discharge.  Activity Barriers & Risk Stratification: Activity Barriers & Cardiac Risk Stratification - 07/24/17 1013      Activity Barriers & Cardiac Risk Stratification   Activity Barriers  Back Problems;Deconditioning;Other (comment) Chronic (L) knee paing that flares up from time to time.    Comments  Lightheadedness during walk test. He states he does not experience  being light headed at home.     Cardiac Risk Stratification  High       6 Minute Walk: 6 Minute Walk    Row Name 07/24/17 1012         6 Minute Walk   Phase  Initial     Distance  1250 feet     Distance % Change  0 %     Distance Feet Change  0 ft     Walk Time  6 minutes     # of Rest Breaks  0     MPH  2.36     METS  2.81     RPE  13     Perceived Dyspnea   11     VO2 Peak  6.23     Symptoms  Yes (comment)     Comments  Lightheadedness     Resting HR  57 bpm     Resting BP  132/62     Resting Oxygen Saturation   100 %     Exercise Oxygen Saturation  during 6 min walk  89 %     Max Ex. HR  83 bpm     Max Ex. BP  148/68     2 Minute Post BP  136/64        Oxygen Initial Assessment:   Oxygen Re-Evaluation:   Oxygen Discharge (Final Oxygen Re-Evaluation):   Initial Exercise Prescription: Initial Exercise Prescription - 07/24/17 1000      Date of Initial Exercise RX and Referring Provider   Date  07/24/17    Referring Provider  Dr. Stanford Breed      Treadmill   MPH  1.2    Grade  0    Minutes  15    METs  1.9      NuStep   Level  0    SPM  72    Minutes  20    METs  1.5      Prescription Details   Frequency (times per week)  3    Duration  Progress to 30 minutes of continuous aerobic without signs/symptoms of physical distress      Intensity   THRR 40-80% of Max Heartrate  [phone]    Ratings of Perceived Exertion  11-13    Perceived Dyspnea  0-4      Progression   Progression  Continue progressive overload as per policy without signs/symptoms or physical distress.      Resistance Training   Training Prescription  Yes    Weight  1    Reps  10-15       Perform Capillary Blood Glucose checks as needed.  Exercise Prescription Changes:  Exercise Prescription Changes    Row Name 07/27/17 1200 07/30/17 1500  Response to Exercise   Blood Pressure (Admit)  -  120/60      Blood Pressure (Exercise)  -  144/60      Blood Pressure  (Exit)  -  130/50      Heart Rate (Admit)  -  62 bpm      Heart Rate (Exercise)  -  84 bpm      Heart Rate (Exit)  -  70 bpm      Rating of Perceived Exertion (Exercise)  -  11      Duration  -  Progress to 30 minutes of  aerobic without signs/symptoms of physical distress      Intensity  -  THRR New [phone]        Progression   Progression  -  Continue to progress workloads to maintain intensity without signs/symptoms of physical distress.        Resistance Training   Training Prescription  Yes  Yes      Weight  1  1      Reps  10-15  10-15        Treadmill   MPH  1.2  1.2      Grade  0  0      Minutes  15  15      METs  1.9  1.9        NuStep   Level  0  1      SPM  72  87      Minutes  20  20      METs  1.5  2.4        Home Exercise Plan   Plans to continue exercise at  Home (comment)  Home (comment)      Frequency  Add 2 additional days to program exercise sessions.  Add 2 additional days to program exercise sessions.      Initial Home Exercises Provided  07/27/17  07/27/17         Exercise Comments:  Exercise Comments    Row Name 07/27/17 1242 07/30/17 1517         Exercise Comments  Patient recieved the take home exercise plan today. THr was addressed as were safety guidelines for being active when not in CR. Patient demonstrated an understanding and was encouraged to ask any future questions as they arise.   Patient has just started Cr and will be progressed in time in tune to his exercise goals         Exercise Goals and Review:  Exercise Goals    Row Name 07/24/17 1015             Exercise Goals   Increase Physical Activity  Yes       Intervention  Provide advice, education, support and counseling about physical activity/exercise needs.;Develop an individualized exercise prescription for aerobic and resistive training based on initial evaluation findings, risk stratification, comorbidities and participant's personal goals.       Expected Outcomes   Short Term: Attend rehab on a regular basis to increase amount of physical activity.       Increase Strength and Stamina  Yes       Intervention  Provide advice, education, support and counseling about physical activity/exercise needs.;Develop an individualized exercise prescription for aerobic and resistive training based on initial evaluation findings, risk stratification, comorbidities and participant's personal goals.       Expected Outcomes  Short Term: Increase workloads from initial exercise prescription for  resistance, speed, and METs.       Able to understand and use rate of perceived exertion (RPE) scale  Yes       Intervention  Provide education and explanation on how to use RPE scale       Expected Outcomes  Short Term: Able to use RPE daily in rehab to express subjective intensity level;Long Term:  Able to use RPE to guide intensity level when exercising independently       Able to understand and use Dyspnea scale  Yes       Intervention  Provide education and explanation on how to use Dyspnea scale       Expected Outcomes  Short Term: Able to use Dyspnea scale daily in rehab to express subjective sense of shortness of breath during exertion;Long Term: Able to use Dyspnea scale to guide intensity level when exercising independently       Knowledge and understanding of Target Heart Rate Range (THRR)  Yes       Intervention  Provide education and explanation of THRR including how the numbers were predicted and where they are located for reference       Expected Outcomes  Short Term: Able to state/look up THRR;Long Term: Able to use THRR to govern intensity when exercising independently;Short Term: Able to use daily as guideline for intensity in rehab       Able to check pulse independently  Yes       Intervention  Provide education and demonstration on how to check pulse in carotid and radial arteries.;Review the importance of being able to check your own pulse for safety during independent  exercise       Expected Outcomes  Short Term: Able to explain why pulse checking is important during independent exercise;Long Term: Able to check pulse independently and accurately       Understanding of Exercise Prescription  Yes       Intervention  Provide education, explanation, and written materials on patient's individual exercise prescription       Expected Outcomes  Short Term: Able to explain program exercise prescription;Long Term: Able to explain home exercise prescription to exercise independently          Exercise Goals Re-Evaluation : Exercise Goals Re-Evaluation    Row Name 07/30/17 1517             Exercise Goal Re-Evaluation   Exercise Goals Review  Increase Physical Activity;Increase Strength and Stamina;Able to check pulse independently;Able to understand and use Dyspnea scale;Able to understand and use rate of perceived exertion (RPE) scale;Knowledge and understanding of Target Heart Rate Range (THRR);Understanding of Exercise Prescription       Comments  Patient has just started Cr and will be progressed in time in tune to his exercise goals       Expected Outcomes  Patient wishes to gain less back pain and to gain more mobility and ADLs.           Discharge Exercise Prescription (Final Exercise Prescription Changes): Exercise Prescription Changes - 07/30/17 1500      Response to Exercise   Blood Pressure (Admit)  120/60    Blood Pressure (Exercise)  144/60    Blood Pressure (Exit)  130/50    Heart Rate (Admit)  62 bpm    Heart Rate (Exercise)  84 bpm    Heart Rate (Exit)  70 bpm    Rating of Perceived Exertion (Exercise)  11    Duration  Progress to  30 minutes of  aerobic without signs/symptoms of physical distress    Intensity  THRR New 91-105-120      Progression   Progression  Continue to progress workloads to maintain intensity without signs/symptoms of physical distress.      Resistance Training   Training Prescription  Yes    Weight  1    Reps   10-15      Treadmill   MPH  1.2    Grade  0    Minutes  15    METs  1.9      NuStep   Level  1    SPM  87    Minutes  20    METs  2.4      Home Exercise Plan   Plans to continue exercise at  Home (comment)    Frequency  Add 2 additional days to program exercise sessions.    Initial Home Exercises Provided  07/27/17       Nutrition:  Target Goals: Understanding of nutrition guidelines, daily intake of sodium 1500mg , cholesterol 200mg , calories 30% from fat and 7% or less from saturated fats, daily to have 5 or more servings of fruits and vegetables.  Biometrics: Pre Biometrics - 07/24/17 1015      Pre Biometrics   Height  5\' 7"  (1.702 m)    Weight  186 lb 9.6 oz (84.6 kg)    Waist Circumference  19 inches    Hip Circumference  19 inches    Waist to Hip Ratio  1 %    BMI (Calculated)  29.22    Triceps Skinfold  10 mm    % Body Fat  26.7 %    Grip Strength  70.3 kg    Flexibility  0 in    Single Leg Stand  3 seconds        Nutrition Therapy Plan and Nutrition Goals: Nutrition Therapy & Goals - 07/24/17 1126      Personal Nutrition Goals   Personal Goal #2  Patient has been eating a diabetic diet for years. He has added low sodium diet since recent heart event along with heart healthy diet.     Additional Goals?  No       Nutrition Assessments: Nutrition Assessments - 07/24/17 1127      MEDFICTS Scores   Pre Score  68       Nutrition Goals Re-Evaluation:   Nutrition Goals Discharge (Final Nutrition Goals Re-Evaluation):   Psychosocial: Target Goals: Acknowledge presence or absence of significant depression and/or stress, maximize coping skills, provide positive support system. Participant is able to verbalize types and ability to use techniques and skills needed for reducing stress and depression.  Initial Review & Psychosocial Screening: Initial Psych Review & Screening - 07/24/17 1135      Initial Review   Current issues with  None Identified       Family Dynamics   Good Support System?  Yes      Barriers   Psychosocial barriers to participate in program  There are no identifiable barriers or psychosocial needs.      Screening Interventions   Interventions  Encouraged to exercise    Expected Outcomes  Short Term goal: Identification and review with participant of any Quality of Life or Depression concerns found by scoring the questionnaire.;Long Term goal: The participant improves quality of Life and PHQ9 Scores as seen by post scores and/or verbalization of changes       Quality of  Life Scores: Quality of Life - 07/24/17 1016      Quality of Life Scores   Health/Function Pre  21.37 %    Socioeconomic Pre  25 %    Psych/Spiritual Pre  22.75 %    Family Pre  25.9 %    GLOBAL Pre  23.02 %      Scores of 19 and below usually indicate a poorer quality of life in these areas.  A difference of  2-3 points is a clinically meaningful difference.  A difference of 2-3 points in the total score of the Quality of Life Index has been associated with significant improvement in overall quality of life, self-image, physical symptoms, and general health in studies assessing change in quality of life.  PHQ-9: Recent Review Flowsheet Data    Depression screen [facility] 2/9 07/24/2017   Decreased Interest 0   Down, Depressed, Hopeless 0   PHQ - 2 Score 0   Altered sleeping 0   Tired, decreased energy 1   Change in appetite 0   Feeling bad or failure about yourself  0   Trouble concentrating 0   Moving slowly or fidgety/restless 0   Suicidal thoughts 0   PHQ-9 Score 1   Difficult doing work/chores Somewhat difficult     Interpretation of Total Score  Total Score Depression Severity:  1-4 = Minimal depression, 5-9 = Mild depression, 10-14 = Moderate depression, 15-19 = Moderately severe depression, 20-27 = Severe depression   Psychosocial Evaluation and Intervention: Psychosocial Evaluation - 07/24/17 1136      Psychosocial Evaluation  & Interventions   Interventions  Encouraged to exercise with the program and follow exercise prescription    Continue Psychosocial Services   No Follow up required       Psychosocial Re-Evaluation:   Psychosocial Discharge (Final Psychosocial Re-Evaluation):   Vocational Rehabilitation: Provide vocational rehab assistance to qualifying candidates.   Vocational Rehab Evaluation & Intervention: Vocational Rehab - 07/24/17 1118      Initial Vocational Rehab Evaluation & Intervention   Assessment shows need for Vocational Rehabilitation  No       Education: Education Goals: Education classes will be provided on a weekly basis, covering required topics. Participant will state understanding/return demonstration of topics presented.  Learning Barriers/Preferences: Learning Barriers/Preferences - 07/24/17 1117      Learning Barriers/Preferences   Learning Barriers  None    Learning Preferences  Written Material;Pictoral;Video       Education Topics: Hypertension, Hypertension Reduction -Define heart disease and high blood pressure. Discus how high blood pressure affects the body and ways to reduce high blood pressure.   Exercise and Your Heart -Discuss why it is important to exercise, the FITT principles of exercise, normal and abnormal responses to exercise, and how to exercise safely.   CARDIAC REHAB PHASE II EXERCISE from 07/29/2017 in [place]  Date  07/29/17  Educator  [place]  Instruction Review Code  2- Demonstrated Understanding      Angina -Discuss definition of angina, causes of angina, treatment of angina, and how to decrease risk of having angina.   Cardiac Medications -Review what the following cardiac medications are used for, how they affect the body, and side effects that may occur when taking the medications.  Medications include Aspirin, Beta blockers, calcium channel blockers, ACE Inhibitors, angiotensin receptor blockers, diuretics,  digoxin, and antihyperlipidemics.   Congestive Heart Failure -Discuss the definition of CHF, how to live with CHF, the signs and symptoms of CHF,  and how keep track of weight and sodium intake.   Heart Disease and Intimacy -Discus the effect sexual activity has on the heart, how changes occur during intimacy as we age, and safety during sexual activity.   Smoking Cessation / COPD -Discuss different methods to quit smoking, the health benefits of quitting smoking, and the definition of COPD.   Nutrition I: Fats -Discuss the types of cholesterol, what cholesterol does to the heart, and how cholesterol levels can be controlled.   Nutrition II: Labels -Discuss the different components of food labels and how to read food label   Heart Parts/Heart Disease and PAD -Discuss the anatomy of the heart, the pathway of blood circulation through the heart, and these are affected by heart disease.   Stress I: Signs and Symptoms -Discuss the causes of stress, how stress may lead to anxiety and depression, and ways to limit stress.   Stress II: Relaxation -Discuss different types of relaxation techniques to limit stress.   Warning Signs of Stroke / TIA -Discuss definition of a stroke, what the signs and symptoms are of a stroke, and how to identify when someone is having stroke.   Knowledge Questionnaire Score: Knowledge Questionnaire Score - 07/24/17 1118      Knowledge Questionnaire Score   Pre Score  20/24       Core Components/Risk Factors/Patient Goals at Admission: Personal Goals and Risk Factors at Admission - 07/24/17 1127      Core Components/Risk Factors/Patient Goals on Admission    Weight Management  Weight Maintenance    Personal Goal Other  Yes    Personal Goal  Get back to normal ADL's, have less back pain and to be more mobile    Intervention  Attend CR 3 x week and supplement with home exercise 2 x week.     Expected Outcomes  Achieve personal goals        Core Components/Risk Factors/Patient Goals Review:    Core Components/Risk Factors/Patient Goals at Discharge (Final Review):    ITP Comments: ITP Comments    Row Name 07/24/17 1120 08/03/17 0755         ITP Comments  Mr. Sterbenz is a pleasant 84 year old gentleman. He has had NSTEMI with stent placment. He also c/o have (L) knee pain, and back pain. he states that he still has a heart blockage that his cardilogist is watching. His wife is very supportive and patient is eager to get started.   Patient new to program. He has completed 4 sessions. Will continue to monitor for progress.          Comments: ITP 30 Day REVIEW Patient new to program. He has completed 4 sessions. Will continue to monitor for progress.

## 2017-08-03 NOTE — Progress Notes (Signed)
Daily Session Note  Patient Details  Name: Erik Logan MRN: [id number] Date of Birth: [birth year] Referring Provider:     CARDIAC REHAB PHASE II ORIENTATION from 07/24/2017 in [place]  Referring Provider  Dr. Stanford Breed      Encounter Date: 08/03/2017  Check In: Session Check In - 08/03/17 0945      Check-In   Location  [facility]    Staff Present  Suzanne Boron, BS, EP, Exercise Physiologist;Debra Wynetta Emery, RN, BSN;Diane Coad, MS, EP, [facility], Exercise Physiologist    Supervising physician immediately available to respond to emergencies  See telemetry face sheet for immediately available MD    Medication changes reported      No    Fall or balance concerns reported     No    Warm-up and Cool-down  Performed as group-led instruction    Resistance Training Performed  Yes    VAD Patient?  No      Pain Assessment   Currently in Pain?  No/denies    Pain Score  0-No pain    Multiple Pain Sites  No       Capillary Blood Glucose: No results found for this or any previous visit (from the past 24 hour(s)).    Social History   Tobacco Use  Smoking Status Former Smoker  . Packs/day: 1.00  . Years: 32.00  . Pack years: 32.00  . Types: Cigarettes  Smokeless Tobacco Never Used  Tobacco Comment   Quit around age 50    Goals Met:  Independence with exercise equipment Exercise tolerated well No report of cardiac concerns or symptoms Strength training completed today  Goals Unmet:  Not Applicable  Comments: Check out 1030   Dr. Kate Sable is Medical Director for [facility] and Pulmonary Rehab.

## 2017-08-04 ENCOUNTER — Telehealth: Payer: Self-pay | Admitting: *Deleted

## 2017-08-04 DIAGNOSIS — I10 Essential (primary) hypertension: Secondary | ICD-10-CM

## 2017-08-04 NOTE — Telephone Encounter (Signed)
Pt voiced understanding - says dizziness has improved today and will continue to watch salt - will mail lab orders and pt will update [place] next week on symptoms

## 2017-08-04 NOTE — Telephone Encounter (Signed)
-----   Message from Arnoldo Lenis, MD sent at 07/31/2017 11:55 AM EDT ----- Labs do show some decrease in his kidney function which is consistent with dehydration, which would explain his dizziness. This should improve with the lower dose of lasix that we talked about last visit. . Repeat BMET/Mg in 2 weeks. Can he update [place] on his dizziness next week  Zandra Abts MD

## 2017-08-05 ENCOUNTER — Encounter: Payer: Self-pay | Admitting: Cardiology

## 2017-08-05 ENCOUNTER — Encounter (HOSPITAL_COMMUNITY)
Admission: RE | Admit: 2017-08-05 | Discharge: 2017-08-05 | Disposition: A | Discharge status: Home / Self Care | Payer: Medicare Other | Source: Ambulatory Visit | Attending: Cardiology | Admitting: Cardiology

## 2017-08-05 DIAGNOSIS — E663 Overweight: Secondary | ICD-10-CM | POA: Diagnosis not present

## 2017-08-05 DIAGNOSIS — R600 Localized edema: Secondary | ICD-10-CM | POA: Diagnosis not present

## 2017-08-05 DIAGNOSIS — E1359 Other specified diabetes mellitus with other circulatory complications: Secondary | ICD-10-CM | POA: Diagnosis not present

## 2017-08-05 DIAGNOSIS — I7389 Other specified peripheral vascular diseases: Secondary | ICD-10-CM | POA: Diagnosis not present

## 2017-08-05 DIAGNOSIS — Z955 Presence of coronary angioplasty implant and graft: Secondary | ICD-10-CM

## 2017-08-05 DIAGNOSIS — E1165 Type 2 diabetes mellitus with hyperglycemia: Secondary | ICD-10-CM | POA: Diagnosis not present

## 2017-08-05 DIAGNOSIS — I214 Non-ST elevation (NSTEMI) myocardial infarction: Secondary | ICD-10-CM | POA: Diagnosis not present

## 2017-08-05 DIAGNOSIS — I2510 Atherosclerotic heart disease of native coronary artery without angina pectoris: Secondary | ICD-10-CM | POA: Diagnosis not present

## 2017-08-05 DIAGNOSIS — I10 Essential (primary) hypertension: Secondary | ICD-10-CM | POA: Diagnosis not present

## 2017-08-05 DIAGNOSIS — E785 Hyperlipidemia, unspecified: Secondary | ICD-10-CM | POA: Diagnosis not present

## 2017-08-05 DIAGNOSIS — E559 Vitamin D deficiency, unspecified: Secondary | ICD-10-CM | POA: Diagnosis not present

## 2017-08-05 DIAGNOSIS — Z48812 Encounter for surgical aftercare following surgery on the circulatory system: Secondary | ICD-10-CM | POA: Diagnosis not present

## 2017-08-05 NOTE — Progress Notes (Signed)
Daily Session Note  Patient Details  Name: Erik Logan MRN: [id number] Date of Birth: 22-Oct-1928 Referring Provider:     CARDIAC REHAB PHASE II ORIENTATION from 07/24/2017 in [place]  Referring Provider  Dr. Stanford Breed      Encounter Date: 08/05/2017  Check In: Session Check In - 08/05/17 0957      Check-In   Location  [facility]    Staff Present  Suzanne Boron, BS, EP, Exercise Physiologist;Diane Coad, MS, EP, [facility], Exercise Physiologist    Supervising physician immediately available to respond to emergencies  See telemetry face sheet for immediately available MD    Medication changes reported      No    Fall or balance concerns reported     No    Warm-up and Cool-down  Performed as group-led instruction    Resistance Training Performed  Yes    VAD Patient?  No      Pain Assessment   Currently in Pain?  No/denies    Pain Score  0-No pain    Multiple Pain Sites  No       Capillary Blood Glucose: No results found for this or any previous visit (from the past 24 hour(s)).    Social History   Tobacco Use  Smoking Status Former Smoker  . Packs/day: 1.00  . Years: 32.00  . Pack years: 32.00  . Types: Cigarettes  Smokeless Tobacco Never Used  Tobacco Comment   Quit around age 63    Goals Met:  Independence with exercise equipment Exercise tolerated well No report of cardiac concerns or symptoms Strength training completed today  Goals Unmet:  Not Applicable  Comments: Check out 1030   Dr. Kate Sable is Medical Director for [facility] and Pulmonary Rehab.

## 2017-08-07 ENCOUNTER — Encounter (HOSPITAL_COMMUNITY)
Admission: RE | Admit: 2017-08-07 | Discharge: 2017-08-07 | Disposition: A | Discharge status: Home / Self Care | Payer: Medicare Other | Source: Ambulatory Visit | Attending: Cardiology | Admitting: Cardiology

## 2017-08-07 DIAGNOSIS — Z48812 Encounter for surgical aftercare following surgery on the circulatory system: Secondary | ICD-10-CM | POA: Diagnosis not present

## 2017-08-07 DIAGNOSIS — Z955 Presence of coronary angioplasty implant and graft: Secondary | ICD-10-CM | POA: Diagnosis not present

## 2017-08-07 DIAGNOSIS — I214 Non-ST elevation (NSTEMI) myocardial infarction: Secondary | ICD-10-CM | POA: Diagnosis not present

## 2017-08-07 NOTE — Progress Notes (Signed)
Daily Session Note  Patient Details  Name: Erik Logan MRN: [id number] Date of Birth: 12/17/1928 Referring Provider:     CARDIAC REHAB PHASE II ORIENTATION from 07/24/2017 in [place]  Referring Provider  Dr. Stanford Breed      Encounter Date: 08/07/2017  Check In: Session Check In - 08/07/17 0930      Check-In   Location  [facility]    Staff Present  Suzanne Boron, BS, EP, Exercise Physiologist;Diane Coad, MS, EP, [facility], Exercise Physiologist;Miana Politte Wynetta Emery, RN, BSN    Supervising physician immediately available to respond to emergencies  See telemetry face sheet for immediately available MD    Medication changes reported      No    Fall or balance concerns reported     No    Warm-up and Cool-down  Performed as group-led instruction    Resistance Training Performed  Yes    VAD Patient?  No      Pain Assessment   Currently in Pain?  No/denies    Pain Score  0-No pain    Multiple Pain Sites  No       Capillary Blood Glucose: No results found for this or any previous visit (from the past 24 hour(s)).    Social History   Tobacco Use  Smoking Status Former Smoker  . Packs/day: 1.00  . Years: 32.00  . Pack years: 32.00  . Types: Cigarettes  Smokeless Tobacco Never Used  Tobacco Comment   Quit around age 84    Goals Met:  Independence with exercise equipment Exercise tolerated well No report of cardiac concerns or symptoms Strength training completed today  Goals Unmet:  Not Applicable  Comments: Check out 1030.   Dr. Kate Sable is Medical Director for [facility] Cardiac and Pulmonary Rehab.

## 2017-08-10 ENCOUNTER — Encounter (HOSPITAL_COMMUNITY): Payer: Medicare Other

## 2017-08-11 ENCOUNTER — Telehealth: Payer: Self-pay | Admitting: *Deleted

## 2017-08-11 NOTE — Telephone Encounter (Signed)
Thanks for update, we will f/u labs   Zandra Abts MD

## 2017-08-11 NOTE — Telephone Encounter (Signed)
Pt says he is feeling much better says he still has occasional dizziness but occurring less often. Also says swelling has improved - describes some puffiness this morning but says that was due to increased salt intake yesterday. BP this morning 130/63 HR 57 - pt will have labs done next week. Will forward to provider as Juluis Rainier

## 2017-08-12 ENCOUNTER — Encounter (HOSPITAL_COMMUNITY)
Admission: RE | Admit: 2017-08-12 | Discharge: 2017-08-12 | Disposition: A | Discharge status: Home / Self Care | Payer: Medicare Other | Source: Ambulatory Visit | Attending: Cardiology | Admitting: Cardiology

## 2017-08-12 DIAGNOSIS — Z48812 Encounter for surgical aftercare following surgery on the circulatory system: Secondary | ICD-10-CM | POA: Diagnosis not present

## 2017-08-12 DIAGNOSIS — I214 Non-ST elevation (NSTEMI) myocardial infarction: Secondary | ICD-10-CM | POA: Diagnosis not present

## 2017-08-12 DIAGNOSIS — Z955 Presence of coronary angioplasty implant and graft: Secondary | ICD-10-CM | POA: Diagnosis not present

## 2017-08-12 NOTE — Progress Notes (Signed)
Daily Session Note  Patient Details  Name: Erik Logan MRN: [id number] Date of Birth: [birth year] Referring Provider:     CARDIAC REHAB PHASE II ORIENTATION from 07/24/2017 in [place]  Referring Provider  Dr. Stanford Breed      Encounter Date: 08/12/2017  Check In: Session Check In - 08/12/17 0930      Check-In   Location  [facility]    Staff Present  Russella Dar, MS, EP, [facility], Exercise Physiologist;Gregory Luther Parody, BS, EP, Exercise Physiologist    Supervising physician immediately available to respond to emergencies  See telemetry face sheet for immediately available MD    Medication changes reported      No    Fall or balance concerns reported     No    Warm-up and Cool-down  Performed as group-led instruction    Resistance Training Performed  Yes    VAD Patient?  No      Pain Assessment   Currently in Pain?  No/denies    Pain Score  0-No pain    Multiple Pain Sites  No       Capillary Blood Glucose: No results found for this or any previous visit (from the past 24 hour(s)).    Social History   Tobacco Use  Smoking Status Former Smoker  . Packs/day: 1.00  . Years: 32.00  . Pack years: 32.00  . Types: Cigarettes  Smokeless Tobacco Never Used  Tobacco Comment   Quit around age 74    Goals Met:  Independence with exercise equipment Exercise tolerated well No report of cardiac concerns or symptoms Strength training completed today  Goals Unmet:  Not Applicable  Comments: Check out: 1030   Dr. Kate Sable is Medical Director for [place] and Pulmonary Rehab.

## 2017-08-14 ENCOUNTER — Encounter (HOSPITAL_COMMUNITY)
Admission: RE | Admit: 2017-08-14 | Discharge: 2017-08-14 | Disposition: A | Discharge status: Home / Self Care | Payer: Medicare Other | Source: Ambulatory Visit | Attending: Cardiology | Admitting: Cardiology

## 2017-08-14 DIAGNOSIS — I214 Non-ST elevation (NSTEMI) myocardial infarction: Secondary | ICD-10-CM | POA: Diagnosis not present

## 2017-08-14 DIAGNOSIS — Z955 Presence of coronary angioplasty implant and graft: Secondary | ICD-10-CM

## 2017-08-14 DIAGNOSIS — Z48812 Encounter for surgical aftercare following surgery on the circulatory system: Secondary | ICD-10-CM | POA: Diagnosis not present

## 2017-08-14 NOTE — Progress Notes (Signed)
Daily Session Note  Patient Details  Name: Erik Logan MRN: [id number] Date of Birth: 07/04/1928 Referring Provider:     CARDIAC REHAB PHASE II ORIENTATION from 07/24/2017 in [place] CARDIAC REHABILITATION  Referring Provider  Dr. Crenshaw      Encounter Date: 08/14/2017  Check In: Session Check In - 08/14/17 1004      Check-In   Location  [facility]    Staff Present  Muhamed Luecke, BS, EP, Exercise Physiologist;Debra Johnson, RN, BSN    Supervising physician immediately available to respond to emergencies  See telemetry face sheet for immediately available MD    Medication changes reported      No    Fall or balance concerns reported     No    Warm-up and Cool-down  Performed as group-led instruction    Resistance Training Performed  Yes    VAD Patient?  No      Pain Assessment   Currently in Pain?  No/denies    Pain Score  0-No pain    Multiple Pain Sites  No       Capillary Blood Glucose: No results found for this or any previous visit (from the past 24 hour(s)).    Social History   Tobacco Use  Smoking Status Former Smoker  . Packs/day: 1.00  . Years: 32.00  . Pack years: 32.00  . Types: Cigarettes  Smokeless Tobacco Never Used  Tobacco Comment   Quit around age 52    Goals Met:  Independence with exercise equipment Exercise tolerated well Personal goals reviewed No report of cardiac concerns or symptoms Strength training completed today  Goals Unmet:  Not Applicable  Comments: Check out 1030   Dr. Suresh Koneswaran is Medical Director for [place] [facility] and Pul[facility] Rehab. 

## 2017-08-17 ENCOUNTER — Encounter (HOSPITAL_COMMUNITY)
Admission: RE | Admit: 2017-08-17 | Discharge: 2017-08-17 | Disposition: A | Discharge status: Home / Self Care | Payer: Medicare Other | Source: Ambulatory Visit | Attending: Cardiology | Admitting: Cardiology

## 2017-08-17 DIAGNOSIS — I214 Non-ST elevation (NSTEMI) myocardial infarction: Secondary | ICD-10-CM | POA: Diagnosis not present

## 2017-08-17 DIAGNOSIS — Z48812 Encounter for surgical aftercare following surgery on the circulatory system: Secondary | ICD-10-CM | POA: Diagnosis not present

## 2017-08-17 DIAGNOSIS — Z955 Presence of coronary angioplasty implant and graft: Secondary | ICD-10-CM | POA: Insufficient documentation

## 2017-08-17 NOTE — Progress Notes (Signed)
Daily Session Note  Patient Details  Name: Erik Logan MRN: [id number] Date of Birth: 1928/09/01 Referring Provider:     CARDIAC REHAB PHASE II ORIENTATION from 07/24/2017 in [place]  Referring Provider  Dr. Stanford Breed      Encounter Date: 08/17/2017  Check In: Session Check In - 08/17/17 0930      Check-In   Location  [facility]    Staff Present  Suzanne Boron, BS, EP, Exercise Physiologist;Oneka Parada Wynetta Emery, RN, BSN;Diane Coad, MS, EP, [facility], Exercise Physiologist    Supervising physician immediately available to respond to emergencies  See telemetry face sheet for immediately available MD    Medication changes reported      No    Fall or balance concerns reported     No    Warm-up and Cool-down  Performed as group-led instruction    Resistance Training Performed  Yes    VAD Patient?  No      Pain Assessment   Currently in Pain?  No/denies    Pain Score  0-No pain    Multiple Pain Sites  No       Capillary Blood Glucose: No results found for this or any previous visit (from the past 24 hour(s)).    Social History   Tobacco Use  Smoking Status Former Smoker  . Packs/day: 1.00  . Years: 32.00  . Pack years: 32.00  . Types: Cigarettes  Smokeless Tobacco Never Used  Tobacco Comment   Quit around age 76    Goals Met:  Independence with exercise equipment Exercise tolerated well No report of cardiac concerns or symptoms Strength training completed today  Goals Unmet:  Not Applicable  Comments: Check out 1030.   Dr. Kate Sable is Medical Director for [facility] Cardiac and Pulmonary Rehab.

## 2017-08-18 ENCOUNTER — Telehealth: Payer: Self-pay | Admitting: *Deleted

## 2017-08-18 DIAGNOSIS — I10 Essential (primary) hypertension: Secondary | ICD-10-CM | POA: Diagnosis not present

## 2017-08-18 NOTE — Telephone Encounter (Signed)
Pt says he was getting repeat lab work done today at [facility] - has f/u on 6/11

## 2017-08-19 ENCOUNTER — Encounter (HOSPITAL_COMMUNITY)
Admission: RE | Admit: 2017-08-19 | Discharge: 2017-08-19 | Disposition: A | Discharge status: Home / Self Care | Payer: Medicare Other | Source: Ambulatory Visit | Attending: Cardiology | Admitting: Cardiology

## 2017-08-19 DIAGNOSIS — Z48812 Encounter for surgical aftercare following surgery on the circulatory system: Secondary | ICD-10-CM | POA: Diagnosis not present

## 2017-08-19 DIAGNOSIS — Z955 Presence of coronary angioplasty implant and graft: Secondary | ICD-10-CM | POA: Diagnosis not present

## 2017-08-19 DIAGNOSIS — I214 Non-ST elevation (NSTEMI) myocardial infarction: Secondary | ICD-10-CM | POA: Diagnosis not present

## 2017-08-19 NOTE — Progress Notes (Signed)
Daily Session Note  Patient Details  Name: LYDELL MOGA MRN: [id number] Date of Birth: Jan 22, 1929 Referring Provider:     CARDIAC REHAB PHASE II ORIENTATION from 07/24/2017 in [place]  Referring Provider  Dr. Stanford Breed      Encounter Date: 08/19/2017  Check In: Session Check In - 08/19/17 0930      Check-In   Location  [facility]    Staff Present  Russella Dar, MS, EP, [facility], Exercise Physiologist;Debra Wynetta Emery, RN, BSN    Supervising physician immediately available to respond to emergencies  See telemetry face sheet for immediately available MD    Medication changes reported      No    Fall or balance concerns reported     No    Warm-up and Cool-down  Performed as group-led instruction    Resistance Training Performed  Yes    VAD Patient?  No      Pain Assessment   Currently in Pain?  No/denies    Pain Score  0-No pain    Multiple Pain Sites  No       Capillary Blood Glucose: No results found for this or any previous visit (from the past 24 hour(s)).    Social History   Tobacco Use  Smoking Status Former Smoker  . Packs/day: 1.00  . Years: 32.00  . Pack years: 32.00  . Types: Cigarettes  Smokeless Tobacco Never Used  Tobacco Comment   Quit around age 69    Goals Met:  Independence with exercise equipment Exercise tolerated well Personal goals reviewed No report of cardiac concerns or symptoms Strength training completed today  Goals Unmet:  Not Applicable  Comments: Check out: 1030   Dr. Kate Sable is Medical Director for [place] and Pulmonary Rehab.

## 2017-08-21 ENCOUNTER — Encounter (HOSPITAL_COMMUNITY)
Admission: RE | Admit: 2017-08-21 | Discharge: 2017-08-21 | Disposition: A | Discharge status: Home / Self Care | Payer: Medicare Other | Source: Ambulatory Visit | Attending: Cardiology | Admitting: Cardiology

## 2017-08-21 DIAGNOSIS — Z955 Presence of coronary angioplasty implant and graft: Secondary | ICD-10-CM | POA: Diagnosis not present

## 2017-08-21 DIAGNOSIS — Z48812 Encounter for surgical aftercare following surgery on the circulatory system: Secondary | ICD-10-CM | POA: Diagnosis not present

## 2017-08-21 DIAGNOSIS — I214 Non-ST elevation (NSTEMI) myocardial infarction: Secondary | ICD-10-CM | POA: Diagnosis not present

## 2017-08-21 NOTE — Progress Notes (Signed)
Daily Session Note  Patient Details  Name: Erik Logan MRN: [id number] Date of Birth: 01/23/1929 Referring Provider:     CARDIAC REHAB PHASE II ORIENTATION from 07/24/2017 in [place]  Referring Provider  Dr. Stanford Breed      Encounter Date: 08/21/2017  Check In: Session Check In - 08/21/17 0930      Check-In   Location  [facility]    Staff Present  Russella Dar, MS, EP, [facility], Exercise Physiologist;Elyan Vanwieren Wynetta Emery, RN, BSN    Supervising physician immediately available to respond to emergencies  See telemetry face sheet for immediately available MD    Medication changes reported      No    Fall or balance concerns reported     No    Warm-up and Cool-down  Performed as group-led instruction    Resistance Training Performed  Yes    VAD Patient?  No      Pain Assessment   Currently in Pain?  No/denies    Pain Score  0-No pain    Multiple Pain Sites  No       Capillary Blood Glucose: No results found for this or any previous visit (from the past 24 hour(s)).    Social History   Tobacco Use  Smoking Status Former Smoker  . Packs/day: 1.00  . Years: 32.00  . Pack years: 32.00  . Types: Cigarettes  Smokeless Tobacco Never Used  Tobacco Comment   Quit around age 41    Goals Met:  Independence with exercise equipment Exercise tolerated well No report of cardiac concerns or symptoms Strength training completed today  Goals Unmet:  Not Applicable  Comments: Check out 1030.   Dr. Kate Sable is Medical Director for [facility] Cardiac and Pulmonary Rehab.

## 2017-08-24 ENCOUNTER — Encounter (HOSPITAL_COMMUNITY)
Admission: RE | Admit: 2017-08-24 | Discharge: 2017-08-24 | Disposition: A | Discharge status: Home / Self Care | Payer: Medicare Other | Source: Ambulatory Visit | Attending: Cardiology | Admitting: Cardiology

## 2017-08-24 ENCOUNTER — Encounter: Payer: Self-pay | Admitting: *Deleted

## 2017-08-24 DIAGNOSIS — Z955 Presence of coronary angioplasty implant and graft: Secondary | ICD-10-CM | POA: Diagnosis not present

## 2017-08-24 DIAGNOSIS — I214 Non-ST elevation (NSTEMI) myocardial infarction: Secondary | ICD-10-CM | POA: Diagnosis not present

## 2017-08-24 DIAGNOSIS — Z48812 Encounter for surgical aftercare following surgery on the circulatory system: Secondary | ICD-10-CM | POA: Diagnosis not present

## 2017-08-24 NOTE — Progress Notes (Signed)
Daily Session Note  Patient Details  Name: Erik Logan MRN: [id number] Date of Birth: 1928-05-26 Referring Provider:     CARDIAC REHAB PHASE II ORIENTATION from 07/24/2017 in [place]  Referring Provider  Dr. Stanford Breed      Encounter Date: 08/24/2017  Check In: Session Check In - 08/24/17 1001      Check-In   Location  [facility]    Staff Present  Diane Angelina Pih, MS, EP, [facility], Exercise Physiologist;Debra Wynetta Emery, RN, BSN;Avary Pitsenbarger, BS, EP, Exercise Physiologist    Supervising physician immediately available to respond to emergencies  See telemetry face sheet for immediately available MD    Medication changes reported      No    Fall or balance concerns reported     No    Warm-up and Cool-down  Performed as group-led instruction    Resistance Training Performed  Yes    VAD Patient?  No      Pain Assessment   Currently in Pain?  No/denies    Pain Score  0-No pain    Multiple Pain Sites  No       Capillary Blood Glucose: No results found for this or any previous visit (from the past 24 hour(s)).    Social History   Tobacco Use  Smoking Status Former Smoker  . Packs/day: 1.00  . Years: 32.00  . Pack years: 32.00  . Types: Cigarettes  Smokeless Tobacco Never Used  Tobacco Comment   Quit around age 7    Goals Met:  Independence with exercise equipment Exercise tolerated well No report of cardiac concerns or symptoms Strength training completed today  Goals Unmet:  Not Applicable  Comments: Check out 1030   Dr. Kate Sable is Medical Director for [place] and Pulmonary Rehab.

## 2017-08-25 ENCOUNTER — Ambulatory Visit (INDEPENDENT_AMBULATORY_CARE_PROVIDER_SITE_OTHER): Payer: Medicare Other | Admitting: Cardiology

## 2017-08-25 ENCOUNTER — Encounter: Payer: Self-pay | Admitting: Cardiology

## 2017-08-25 VITALS — BP 133/60 | HR 54 | Ht 68.0 in | Wt 190.6 lb

## 2017-08-25 DIAGNOSIS — I10 Essential (primary) hypertension: Secondary | ICD-10-CM

## 2017-08-25 DIAGNOSIS — I2510 Atherosclerotic heart disease of native coronary artery without angina pectoris: Secondary | ICD-10-CM

## 2017-08-25 DIAGNOSIS — R600 Localized edema: Secondary | ICD-10-CM

## 2017-08-25 MED ORDER — AMLODIPINE BESYLATE 5 MG PO TABS: 5.0000 mg | ORAL_TABLET | Freq: Every day | ORAL | 1 refills | Status: DC

## 2017-08-25 NOTE — Patient Instructions (Signed)
Your physician recommends that you schedule a follow-up appointment in: [address] [place] has recommended you make the following change in your medication:   DECREASE AMLODIPINE 5 MG DAILY  Thank you for choosing [facility]!!

## 2017-08-25 NOTE — Progress Notes (Signed)
Clinical Summary Erik Logan is a 84 y.o.male seen today for follow up of the following medical problems.   1. Lightheadness/dizziness/HTN/LE edema - mainly occurs with standing. - recently developed some LE edema, he was started on lasix at that time.  - of note the swelling started shortly after starting hydralazine for his bp.   - stopped hydralazine last visit.  - we lowered lasix to 20mg  daily last visit due to orthostaic symptoms.  07/29/17 labs with Cr up to 1.91, up from 1.75 in 05/2017. On repeat 08/18/17 Cr down to 1.64 - taking lasix 20mg  daily, will take 40mg  as needed for swelling.  - norvasc increased to 10mg  06/10/17. Of note swelling started around that time as well.  - still with some dizziness at times   2. CAD - admitted with NSTEMI 05/2017 - see cath report below. S/p PTCA of mid PDA, DES to distal RCA, DES to prox RCA with plans for medical therapy of residual disease 05/2017 echo LVEF 55-60%, no WMAs, grade I diastolic dysfunction  - due to ongoing SOB, we changed his brillinta to plavix last visit. - breathing much improved with change  3. HTN - developed severe LE edema after starting hydralzine. Noted also around that time norvasc was increased from 5 to 10mg  daily.  - hydralazine was discontinued. .   .  4. Hyperlipidemia -he is compliant with statin  5. HTN - home bp's 120s-130s/60-70s - remains compliant with meds  6. CKD III      Past Medical History:  Diagnosis Date  . Chronic renal insufficiency, stage 2 (mild)   . Coronary artery disease   . History of duodenal ulcer 1970s  . Hyperlipidemia   . Hypertension   . NSTEMI (non-ST elevated myocardial infarction) ([place]) 06/01/2017  . Type II diabetes mellitus (HCC)      Allergies  Allergen Reactions  . Beef-Derived Products Hives    Red meat  . Sulfa Antibiotics Swelling     Current Outpatient Medications  Medication Sig Dispense Refill  . amLODipine (NORVASC) 10 MG  tablet Take 1 tablet (10 mg total) by mouth daily. 180 tablet 3  . aspirin EC 81 MG tablet Take 1 tablet (81 mg total) by mouth daily.    Marland Kitchen atorvastatin (LIPITOR) 80 MG tablet Take 1 tablet (80 mg total) by mouth daily at 6 PM. 30 tablet 6  . clopidogrel (PLAVIX) 75 MG tablet TAKE 4 TABLETS 300MG  DAY ONE THEN TAKE 1 TABLET 75 MG DAILY 94 tablet 1  . furosemide (LASIX) 40 MG tablet TAKE 20 MG DAILY MAY TAKE 40 MG AS NEEDED FOR SWELLING 135 tablet 1  . insulin lispro (HUMALOG) 100 UNIT/ML injection Inject 5 Units into the skin See admin instructions. Per Sliding Scale    . isosorbide mononitrate (IMDUR) 30 MG 24 hr tablet Take 1 tablet (30 mg total) by mouth daily. 30 tablet 6  . metoprolol tartrate (LOPRESSOR) 25 MG tablet Take 0.5 tablets (12.5 mg total) by mouth daily. 30 tablet 6  . nitroGLYCERIN (NITROSTAT) 0.4 MG SL tablet Place 1 tablet (0.4 mg total) under the tongue every 5 (five) minutes x 3 doses as needed for chest pain. 25 tablet 12  . Polyethyl Glycol-Propyl Glycol (SYSTANE OP) Apply 2 drops to eye daily as needed (Dry eyes).    Tyler Aas FLEXTOUCH 100 UNIT/ML SOPN FlexTouch Pen Inject 25 Units into the skin every morning.      No current facility-administered medications for this visit.  Past Surgical History:  Procedure Laterality Date  . CATARACT EXTRACTION W/ INTRAOCULAR LENS IMPLANT Left   . CORONARY ANGIOPLASTY WITH STENT PLACEMENT  06/02/2017  . CORONARY BALLOON ANGIOPLASTY N/A 06/02/2017   Procedure: CORONARY BALLOON ANGIOPLASTY;  Surgeon: Troy Sine, MD;  Location: [place] [facility];  Service: Cardiovascular;  Laterality: N/A;  . CORONARY STENT INTERVENTION N/A 06/02/2017   Procedure: CORONARY STENT INTERVENTION;  Surgeon: Troy Sine, MD;  Location: [place] [facility];  Service: Cardiovascular;  Laterality: N/A;  . EYE SURGERY Right 1950s   "S/P injury in ~ 1942 which ; put a hole in the retina; can't see out of it; cataract developed; surgery to remove  cataract in 1950  . LEFT HEART CATH AND CORONARY ANGIOGRAPHY N/A 06/02/2017   Procedure: LEFT HEART CATH AND CORONARY ANGIOGRAPHY;  Surgeon: Troy Sine, MD;  Location: [place] [facility];  Service: Cardiovascular;  Laterality: N/A;  . TONSILLECTOMY  1930s     Allergies  Allergen Reactions  . Beef-Derived Products Hives    Red meat  . Sulfa Antibiotics Swelling      Family History  Problem Relation Age of Onset  . Diabetes Mother   . Hypertension Mother   . Pulmonary disease Father   . Pulmonary disease Sister   . Healthy Sister      Social History Erik Logan reports that he has quit smoking. His smoking use included cigarettes. He has a 32.00 pack-year smoking history. He has never used smokeless tobacco. Erik Logan reports that he does not drink alcohol.   Review of Systems CONSTITUTIONAL: No weight loss, fever, chills, weakness or fatigue.  HEENT: Eyes: No visual loss, blurred vision, double vision or yellow sclerae.No hearing loss, sneezing, congestion, runny nose or sore throat.  SKIN: No rash or itching.  CARDIOVASCULAR: per hpi RESPIRATORY: No shortness of breath, cough or sputum.  GASTROINTESTINAL: No anorexia, nausea, vomiting or diarrhea. No abdominal pain or blood.  GENITOURINARY: No burning on urination, no polyuria NEUROLOGICAL: occasional dizzienss MUSCULOSKELETAL: No muscle, back pain, joint pain or stiffness.  LYMPHATICS: No enlarged nodes. No history of splenectomy.  PSYCHIATRIC: No history of depression or anxiety.  ENDOCRINOLOGIC: No reports of sweating, cold or heat intolerance. No polyuria or polydipsia.  Marland Kitchen   Physical Examination Vitals:   08/25/17 1417  BP: 133/60  Pulse: (!) 54  SpO2: 96%   Vitals:   08/25/17 1417  Weight: 190 lb 9.6 oz (86.5 kg)  Height: 5\' 8"  (1.727 m)    Gen: resting comfortably, no acute distress HEENT: no scleral icterus, pupils equal round and reactive, no palptable cervical adenopathy,  CV: RRR, no m/r/g,  no jvd Resp: Clear to auscultation bilaterally GI: abdomen is soft, non-tender, non-distended, normal bowel sounds, no hepatosplenomegaly MSK: extremities are warm, trace bilateral edema.  Skin: warm, no rash Neuro:  no focal deficits Psych: appropriate affect   Diagnostic Studies 05/2017 cath  Prox RCA lesion is 95% stenosed.  Prox RCA to Mid RCA lesion is 50% stenosed.  Ramus lesion is 50% stenosed.  Ost 2nd Diag to 2nd Diag lesion is 100% stenosed.  RPDA lesion is 90% stenosed.  Balloon angioplasty was performed using a BALLOON SAPPHIRE 2.0X12.  Post intervention, there is a 0% residual stenosis.  Dist RCA lesion is 90% stenosed.  Post intervention, there is a 0% residual stenosis.  A stent was successfully placed.  A stent was successfully placed.  Post intervention, there is a 0% residual stenosis.  Post intervention,  there is a 0% residual stenosis.  Ost 1st Diag to 1st Diag lesion is 85% stenosed.  Multi-vessel CAD with 85% long stenosis in the first diagonal Ellinore Merced of the LAD, initial total occlusion of the second diagonal vessel with later mild opening to 99%, 50% stenosis in the ramus intermediate vessel; normal left circumflex vessel; and diffusely diseased dominant RCA with 95% proximal stenosis followed by diffuse irregularity of 50%, 90% focal stenosis distally beyond the acute margin prior to the PDA, and focal 90% mid PDA stenosis.  LVEDP 17 mm.  Successful multi lesion PCI to the RCA with PTCA of the mid PDA 90% stenosis reduced to 0%; PCI/DES stenting of the 90% distal RCA stenosis treated with a 2.515 mm Resolute stent post dilated to 2.7 mm with the stenosis being reduced to 0%, and insertion of a 3.026 mm Resolute stent to cover the 95 and 50% proximal to mid RCA stenoses, post dilated up to 3.2 mm with the stenoses being reduced to 0%.  RECOMMENDATION: Echo Doppler evaluation for LV function assessment. If the patient develops recurrent chest  pain symptomatology, consider possible PCI to the second diagonal vessel. Otherwise, consider medical therapy with beta blocker, nitrates. Recommended dual antiplatelet therapy for minimum of 1 year. Aggressive lipid intervention.  05/2017 echo  Study Conclusions  - Left ventricle: The cavity size was normal. Wall thickness was increased in a pattern of mild LVH. Systolic function was normal. The estimated ejection fraction was in the range of 55% to 60%. Wall motion was normal; there were no regional wall motion abnormalities. Doppler parameters are consistent with abnormal left ventricular relaxation (grade 1 diastolic dysfunction). - Mitral valve: Mildly calcified annulus. Valve area by pressure half-time: 1.85 cm^2.     Assessment and Plan   1. CAD - recent stenting as described above, continue DAPT at least until 05/2018 - not on ACE-I due to poor renal function - changed from brillinta to plavix due to SOB  2. LE edema - started right after starting hydralazine, and also around the time norvasc was increased - off hydralazine, improving but not resolved. Lower norvasc to 5mg  daily, conitnue lasix.   3. HTN - will tolerate higher bp at this time due to recent dizziness, recent possible bp med side effects we are working to sort out.      Arnoldo Lenis, M.D.

## 2017-08-26 ENCOUNTER — Encounter (HOSPITAL_COMMUNITY)
Admission: RE | Admit: 2017-08-26 | Discharge: 2017-08-26 | Disposition: A | Discharge status: Home / Self Care | Payer: Medicare Other | Source: Ambulatory Visit | Attending: Cardiology | Admitting: Cardiology

## 2017-08-26 DIAGNOSIS — Z955 Presence of coronary angioplasty implant and graft: Secondary | ICD-10-CM

## 2017-08-26 DIAGNOSIS — I214 Non-ST elevation (NSTEMI) myocardial infarction: Secondary | ICD-10-CM

## 2017-08-26 DIAGNOSIS — Z48812 Encounter for surgical aftercare following surgery on the circulatory system: Secondary | ICD-10-CM | POA: Diagnosis not present

## 2017-08-26 NOTE — Progress Notes (Signed)
Daily Session Note  Patient Details  Name: Erik Logan MRN: [id number] Date of Birth: 12/01/1928 Referring Provider:     CARDIAC REHAB PHASE II ORIENTATION from 07/24/2017 in [place]  Referring Provider  Dr. Stanford Breed      Encounter Date: 08/26/2017  Check In: Session Check In - 08/26/17 0930      Check-In   Location  [facility]    Staff Present  Russella Dar, MS, EP, [facility], Exercise Physiologist;Kevaughn Ewing Wynetta Emery, RN, BSN;Gregory Cowan, BS, EP, Exercise Physiologist    Supervising physician immediately available to respond to emergencies  See telemetry face sheet for immediately available MD    Medication changes reported      Yes    Comments  Patient saw his cardiologist yesterday 08/25/17 for a routine follow up. He decreased his Amlodipine from 10 mg to 5 mg daily.    Fall or balance concerns reported     No    Warm-up and Cool-down  Performed as group-led instruction    Resistance Training Performed  Yes    VAD Patient?  No      Pain Assessment   Currently in Pain?  No/denies    Pain Score  0-No pain    Multiple Pain Sites  No       Capillary Blood Glucose: No results found for this or any previous visit (from the past 24 hour(s)).    Social History   Tobacco Use  Smoking Status Former Smoker  . Packs/day: 1.00  . Years: 32.00  . Pack years: 32.00  . Types: Cigarettes  Smokeless Tobacco Never Used  Tobacco Comment   Quit around age 71    Goals Met:  Independence with exercise equipment Exercise tolerated well No report of cardiac concerns or symptoms Strength training completed today  Goals Unmet:  Not Applicable  Comments: Check out 1030.   Dr. Kate Sable is Medical Director for [facility] Cardiac and Pulmonary Rehab.

## 2017-08-27 NOTE — Progress Notes (Signed)
Cardiac Individual Treatment Plan  Patient Details  Name: Erik Logan MRN: [id number] Date of Birth: 06/17/1928 Referring Provider:     CARDIAC REHAB PHASE II ORIENTATION from 07/24/2017 in [place]  Referring Provider  Dr. Stanford Breed      Initial Encounter Date:    CARDIAC REHAB PHASE II ORIENTATION from 07/24/2017 in [place]  Date  07/24/17  Referring Provider  Dr. Stanford Breed      Visit Diagnosis: NSTEMI (non-ST elevated myocardial infarction) [facility])  Status post coronary artery stent placement  Patient's Home Medications on Admission:  Current Outpatient Medications:  .  amLODipine (NORVASC) 5 MG tablet, Take 1 tablet (5 mg total) by mouth daily., Disp: 90 tablet, Rfl: 1 .  aspirin EC 81 MG tablet, Take 1 tablet (81 mg total) by mouth daily., Disp: , Rfl:  .  atorvastatin (LIPITOR) 80 MG tablet, Take 1 tablet (80 mg total) by mouth daily at 6 PM., Disp: 30 tablet, Rfl: 6 .  clopidogrel (PLAVIX) 75 MG tablet, Take 75 mg by mouth daily., Disp: , Rfl:  .  furosemide (LASIX) 40 MG tablet, TAKE 20 MG DAILY MAY TAKE 40 MG AS NEEDED FOR SWELLING, Disp: 135 tablet, Rfl: 1 .  insulin lispro (HUMALOG) 100 UNIT/ML injection, Inject 5 Units into the skin See admin instructions. Per Sliding Scale, Disp: , Rfl:  .  isosorbide mononitrate (IMDUR) 30 MG 24 hr tablet, Take 1 tablet (30 mg total) by mouth daily., Disp: 30 tablet, Rfl: 6 .  metoprolol tartrate (LOPRESSOR) 25 MG tablet, Take 0.5 tablets (12.5 mg total) by mouth daily., Disp: 30 tablet, Rfl: 6 .  nitroGLYCERIN (NITROSTAT) 0.4 MG SL tablet, Place 1 tablet (0.4 mg total) under the tongue every 5 (five) minutes x 3 doses as needed for chest pain., Disp: 25 tablet, Rfl: 12 .  Polyethyl Glycol-Propyl Glycol (SYSTANE OP), Apply 2 drops to eye daily as needed (Dry eyes)., Disp: , Rfl:  .  TRESIBA FLEXTOUCH 100 UNIT/ML SOPN FlexTouch Pen, Inject 25 Units into the skin every morning. , Disp: ,  Rfl:   Past Medical History: Past Medical History:  Diagnosis Date  . Chronic renal insufficiency, stage 2 (mild)   . Coronary artery disease   . History of duodenal ulcer 1970s  . Hyperlipidemia   . Hypertension   . NSTEMI (non-ST elevated myocardial infarction) ([place]) 06/01/2017  . Type II diabetes mellitus (HCC)     Tobacco Use: Social History   Tobacco Use  Smoking Status Former Smoker  . Packs/day: 1.00  . Years: 32.00  . Pack years: 32.00  . Types: Cigarettes  Smokeless Tobacco Never Used  Tobacco Comment   Quit around age 84    Labs: Recent Review Flowsheet Data    Labs for ITP Cardiac and Pulmonary Rehab Latest Ref Rng & Units 06/01/2017 06/02/2017   Cholestrol 0 - 200 mg/dL - 171   LDLCALC 0 - 99 mg/dL - 116(H)   HDL >40 mg/dL - 34(L)   Trlycerides <150 mg/dL - 106   Hemoglobin A1c 4.8 - 5.6 % 9.1(H) -      Capillary Blood Glucose: Lab Results  Component Value Date   GLUCAP 239 (H) 06/03/2017   GLUCAP 152 (H) 06/03/2017   GLUCAP 151 (H) 06/02/2017   GLUCAP 156 (H) 06/02/2017   GLUCAP 131 (H) 06/02/2017     Exercise Target Goals:    Exercise Program Goal: Individual exercise prescription set using results from initial 6 min walk test  and THRR while considering  patient's activity barriers and safety.   Exercise Prescription Goal: Starting with aerobic activity 30 plus minutes a day, 3 days per week for initial exercise prescription. Provide home exercise prescription and guidelines that participant acknowledges understanding prior to discharge.  Activity Barriers & Risk Stratification: Activity Barriers & Cardiac Risk Stratification - 07/24/17 1013      Activity Barriers & Cardiac Risk Stratification   Activity Barriers  Back Problems;Deconditioning;Other (comment) Chronic (L) knee paing that flares up from time to time.    Comments  Lightheadedness during walk test. He states he does not experience being light headed at home.     Cardiac Risk  Stratification  High       6 Minute Walk: 6 Minute Walk    Row Name 07/24/17 1012         6 Minute Walk   Phase  Initial     Distance  1250 feet     Distance % Change  0 %     Distance Feet Change  0 ft     Walk Time  6 minutes     # of Rest Breaks  0     MPH  2.36     METS  2.81     RPE  13     Perceived Dyspnea   11     VO2 Peak  6.23     Symptoms  Yes (comment)     Comments  Lightheadedness     Resting HR  57 bpm     Resting BP  132/62     Resting Oxygen Saturation   100 %     Exercise Oxygen Saturation  during 6 min walk  89 %     Max Ex. HR  83 bpm     Max Ex. BP  148/68     2 Minute Post BP  136/64        Oxygen Initial Assessment:   Oxygen Re-Evaluation:   Oxygen Discharge (Final Oxygen Re-Evaluation):   Initial Exercise Prescription: Initial Exercise Prescription - 07/24/17 1000      Date of Initial Exercise RX and Referring Provider   Date  07/24/17    Referring Provider  Dr. Stanford Breed      Treadmill   MPH  1.2    Grade  0    Minutes  15    METs  1.9      NuStep   Level  0    SPM  72    Minutes  20    METs  1.5      Prescription Details   Frequency (times per week)  3    Duration  Progress to 30 minutes of continuous aerobic without signs/symptoms of physical distress      Intensity   THRR 40-80% of Max Heartrate  [phone]    Ratings of Perceived Exertion  11-13    Perceived Dyspnea  0-4      Progression   Progression  Continue progressive overload as per policy without signs/symptoms or physical distress.      Resistance Training   Training Prescription  Yes    Weight  1    Reps  10-15       Perform Capillary Blood Glucose checks as needed.  Exercise Prescription Changes:  Exercise Prescription Changes    Row Name 07/27/17 1200 07/30/17 1500 08/14/17 1400         Response to Exercise   Blood Pressure (Admit)  -  120/60  142/60     Blood Pressure (Exercise)  -  144/60  152/70     Blood Pressure (Exit)  -  130/50   128/68     Heart Rate (Admit)  -  62 bpm  60 bpm     Heart Rate (Exercise)  -  84 bpm  100 bpm     Heart Rate (Exit)  -  70 bpm  69 bpm     Rating of Perceived Exertion (Exercise)  -  11  11     Duration  -  Progress to 30 minutes of  aerobic without signs/symptoms of physical distress  Progress to 30 minutes of  aerobic without signs/symptoms of physical distress     Intensity  -  THRR New 91-105-120  THRR New 90-104-119       Progression   Progression  -  Continue to progress workloads to maintain intensity without signs/symptoms of physical distress.  Continue to progress workloads to maintain intensity without signs/symptoms of physical distress.       Resistance Training   Training Prescription  Yes  Yes  Yes     Weight  1  1  1      Reps  10-15  10-15  10-15       Treadmill   MPH  1.2  1.2  1.2     Grade  0  0  0     Minutes  15  15  15      METs  1.9  1.9  1.9       NuStep   Level  0  1  1     SPM  72  87  101     Minutes  20  20  20      METs  1.5  2.4  2.2       Arm Ergometer   Level  -  -  1.2     Watts  -  -  20     Minutes  -  -  15     METs  -  -  2.5       Home Exercise Plan   Plans to continue exercise at  Home (comment)  Home (comment)  Home (comment)     Frequency  Add 2 additional days to program exercise sessions.  Add 2 additional days to program exercise sessions.  Add 2 additional days to program exercise sessions.     Initial Home Exercises Provided  07/27/17  07/27/17  07/27/17        Exercise Comments:  Exercise Comments    Row Name 07/27/17 1242 07/30/17 1517 08/14/17 1440       Exercise Comments  Patient recieved the take home exercise plan today. THr was addressed as were safety guidelines for being active when not in CR. Patient demonstrated an understanding and was encouraged to ask any future questions as they arise.   Patient has just started Cr and will be progressed in time in tune to his exercise goals  Patinet is doing well in CR.  patient was taken ff of teh treadmill due to dizziness and light headed feeling and was placed on the arm ergometer. patient is doing very well on his two machines and has increased his SPMs on both since being on them         Exercise Goals and Review:  Exercise Goals    Row Name 07/24/17 1015  Exercise Goals   Increase Physical Activity  Yes       Intervention  Provide advice, education, support and counseling about physical activity/exercise needs.;Develop an individualized exercise prescription for aerobic and resistive training based on initial evaluation findings, risk stratification, comorbidities and participant's personal goals.       Expected Outcomes  Short Term: Attend rehab on a regular basis to increase amount of physical activity.       Increase Strength and Stamina  Yes       Intervention  Provide advice, education, support and counseling about physical activity/exercise needs.;Develop an individualized exercise prescription for aerobic and resistive training based on initial evaluation findings, risk stratification, comorbidities and participant's personal goals.       Expected Outcomes  Short Term: Increase workloads from initial exercise prescription for resistance, speed, and METs.       Able to understand and use rate of perceived exertion (RPE) scale  Yes       Intervention  Provide education and explanation on how to use RPE scale       Expected Outcomes  Short Term: Able to use RPE daily in rehab to express subjective intensity level;Long Term:  Able to use RPE to guide intensity level when exercising independently       Able to understand and use Dyspnea scale  Yes       Intervention  Provide education and explanation on how to use Dyspnea scale       Expected Outcomes  Short Term: Able to use Dyspnea scale daily in rehab to express subjective sense of shortness of breath during exertion;Long Term: Able to use Dyspnea scale to guide intensity level when  exercising independently       Knowledge and understanding of Target Heart Rate Range (THRR)  Yes       Intervention  Provide education and explanation of THRR including how the numbers were predicted and where they are located for reference       Expected Outcomes  Short Term: Able to state/look up THRR;Long Term: Able to use THRR to govern intensity when exercising independently;Short Term: Able to use daily as guideline for intensity in rehab       Able to check pulse independently  Yes       Intervention  Provide education and demonstration on how to check pulse in carotid and radial arteries.;Review the importance of being able to check your own pulse for safety during independent exercise       Expected Outcomes  Short Term: Able to explain why pulse checking is important during independent exercise;Long Term: Able to check pulse independently and accurately       Understanding of Exercise Prescription  Yes       Intervention  Provide education, explanation, and written materials on patient's individual exercise prescription       Expected Outcomes  Short Term: Able to explain program exercise prescription;Long Term: Able to explain home exercise prescription to exercise independently          Exercise Goals Re-Evaluation : Exercise Goals Re-Evaluation    Row Name 07/30/17 1517 08/25/17 0743           Exercise Goal Re-Evaluation   Exercise Goals Review  Increase Physical Activity;Increase Strength and Stamina;Able to check pulse independently;Able to understand and use Dyspnea scale;Able to understand and use rate of perceived exertion (RPE) scale;Knowledge and understanding of Target Heart Rate Range (THRR);Understanding of Exercise Prescription  Increase Physical Activity;Increase Strength and  Stamina;Able to check pulse independently;Able to understand and use Dyspnea scale;Able to understand and use rate of perceived exertion (RPE) scale;Knowledge and understanding of Target Heart Rate  Range (THRR);Understanding of Exercise Prescription      Comments  Patient has just started Cr and will be progressed in time in tune to his exercise goals  Patient is doing very well in CR. Patient has tstaed to me that he is really enjoying the program and the exercises that he is doing here. He states that he is eating better and has gained more mobility from the exercise program.       Expected Outcomes  Patient wishes to gain less back pain and to gain more mobility and ADLs.  Patient wishes to gain less back pain and to gain more mobility and ADLs.          Discharge Exercise Prescription (Final Exercise Prescription Changes): Exercise Prescription Changes - 08/14/17 1400      Response to Exercise   Blood Pressure (Admit)  142/60    Blood Pressure (Exercise)  152/70    Blood Pressure (Exit)  128/68    Heart Rate (Admit)  60 bpm    Heart Rate (Exercise)  100 bpm    Heart Rate (Exit)  69 bpm    Rating of Perceived Exertion (Exercise)  11    Duration  Progress to 30 minutes of  aerobic without signs/symptoms of physical distress    Intensity  THRR New 90-104-119      Progression   Progression  Continue to progress workloads to maintain intensity without signs/symptoms of physical distress.      Resistance Training   Training Prescription  Yes    Weight  1    Reps  10-15      Treadmill   MPH  1.2    Grade  0    Minutes  15    METs  1.9      NuStep   Level  1    SPM  101    Minutes  20    METs  2.2      Arm Ergometer   Level  1.2    Watts  20    Minutes  15    METs  2.5      Home Exercise Plan   Plans to continue exercise at  Home (comment)    Frequency  Add 2 additional days to program exercise sessions.    Initial Home Exercises Provided  07/27/17       Nutrition:  Target Goals: Understanding of nutrition guidelines, daily intake of sodium 1500mg , cholesterol 200mg , calories 30% from fat and 7% or less from saturated fats, daily to have 5 or more servings  of fruits and vegetables.  Biometrics: Pre Biometrics - 07/24/17 1015      Pre Biometrics   Height  5\' 7"  (1.702 m)    Weight  186 lb 9.6 oz (84.6 kg)    Waist Circumference  19 inches    Hip Circumference  19 inches    Waist to Hip Ratio  1 %    BMI (Calculated)  29.22    Triceps Skinfold  10 mm    % Body Fat  26.7 %    Grip Strength  70.3 kg    Flexibility  0 in    Single Leg Stand  3 seconds        Nutrition Therapy Plan and Nutrition Goals: Nutrition Therapy & Goals - 08/20/17 1633  Personal Nutrition Goals   Nutrition Goal  For heart healthy choices add >50% of whole grains, make half their plate fruits and vegetables. Discuss the difference between starchy vegetables and leafy greens, and how leafy vegetables provide fiber, helps maintain healthy weight, helps control blood glucose, and lowers cholesterol.  Discuss purchasing fresh or frozen vegetable to reduce sodium and not to add grease, fat or sugar. Consume <18oz of red meat per week. Consume lean cuts of meats and very little of meats high in sodium and nitrates such as pork and lunch meats. Discussed portion control for all food groups.    Personal Goal #2  Patient has been eating a diabetic diet for years. He has added low sodium diet since recent heart event along with heart healthy diet.     Additional Goals?  No      Intervention Plan   Intervention  Nutrition handout(s) given to patient.    Expected Outcomes  Short Term Goal: Understand basic principles of dietary content, such as calories, fat, sodium, cholesterol and nutrients.       Nutrition Assessments: Nutrition Assessments - 07/24/17 1127      MEDFICTS Scores   Pre Score  68       Nutrition Goals Re-Evaluation: Nutrition Goals Re-Evaluation    Row Name 08/27/17 1546             Goals   Current Weight  190 lb 8 oz (86.4 kg)       Nutrition Goal  For heart healthy choices add >50% of whole grains, make half their plate fruits and  vegetables. Discuss the difference between starchy vegetables and leafy greens, and how leafy vegetables provide fiber, helps maintain healthy weight, helps control blood glucose, and lowers cholesterol.  Discuss purchasing fresh or frozen vegetable to reduce sodium and not to add grease, fat or sugar. Consume <18oz of red meat per week. Consume lean cuts of meats and very little of meats high in sodium and nitrates such as pork and lunch meats. Discussed portion control for all food groups.       Comment  Patient has gained 2 lbs since last 30 day review. He has had some diuretic medication adjustments and says his weight fluctuates. He says he continues to follow a diabetic, low sodium diet.        Expected Outcome  Patient will continue to meet his nutritional goals.           Nutrition Goals Discharge (Final Nutrition Goals Re-Evaluation): Nutrition Goals Re-Evaluation - 08/27/17 1546      Goals   Current Weight  190 lb 8 oz (86.4 kg)    Nutrition Goal  For heart healthy choices add >50% of whole grains, make half their plate fruits and vegetables. Discuss the difference between starchy vegetables and leafy greens, and how leafy vegetables provide fiber, helps maintain healthy weight, helps control blood glucose, and lowers cholesterol.  Discuss purchasing fresh or frozen vegetable to reduce sodium and not to add grease, fat or sugar. Consume <18oz of red meat per week. Consume lean cuts of meats and very little of meats high in sodium and nitrates such as pork and lunch meats. Discussed portion control for all food groups.    Comment  Patient has gained 2 lbs since last 30 day review. He has had some diuretic medication adjustments and says his weight fluctuates. He says he continues to follow a diabetic, low sodium diet.     Expected Outcome  Patient will continue to meet his nutritional goals.        Psychosocial: Target Goals: Acknowledge presence or absence of significant depression and/or  stress, maximize coping skills, provide positive support system. Participant is able to verbalize types and ability to use techniques and skills needed for reducing stress and depression.  Initial Review & Psychosocial Screening: Initial Psych Review & Screening - 07/24/17 1135      Initial Review   Current issues with  None Identified      Family Dynamics   Good Support System?  Yes      Barriers   Psychosocial barriers to participate in program  There are no identifiable barriers or psychosocial needs.      Screening Interventions   Interventions  Encouraged to exercise    Expected Outcomes  Short Term goal: Identification and review with participant of any Quality of Life or Depression concerns found by scoring the questionnaire.;Long Term goal: The participant improves quality of Life and PHQ9 Scores as seen by post scores and/or verbalization of changes       Quality of Life Scores: Quality of Life - 07/24/17 1016      Quality of Life Scores   Health/Function Pre  21.37 %    Socioeconomic Pre  25 %    Psych/Spiritual Pre  22.75 %    Family Pre  25.9 %    GLOBAL Pre  23.02 %      Scores of 19 and below usually indicate a poorer quality of life in these areas.  A difference of  2-3 points is a clinically meaningful difference.  A difference of 2-3 points in the total score of the Quality of Life Index has been associated with significant improvement in overall quality of life, self-image, physical symptoms, and general health in studies assessing change in quality of life.  PHQ-9: Recent Review Flowsheet Data    Depression screen [facility] 2/9 07/24/2017   Decreased Interest 0   Down, Depressed, Hopeless 0   PHQ - 2 Score 0   Altered sleeping 0   Tired, decreased energy 1   Change in appetite 0   Feeling bad or failure about yourself  0   Trouble concentrating 0   Moving slowly or fidgety/restless 0   Suicidal thoughts 0   PHQ-9 Score 1   Difficult doing work/chores  Somewhat difficult     Interpretation of Total Score  Total Score Depression Severity:  1-4 = Minimal depression, 5-9 = Mild depression, 10-14 = Moderate depression, 15-19 = Moderately severe depression, 20-27 = Severe depression   Psychosocial Evaluation and Intervention: Psychosocial Evaluation - 07/24/17 1136      Psychosocial Evaluation & Interventions   Interventions  Encouraged to exercise with the program and follow exercise prescription    Continue Psychosocial Services   No Follow up required       Psychosocial Re-Evaluation: Psychosocial Re-Evaluation    [place] Name 08/27/17 1554             Psychosocial Re-Evaluation   Current issues with  None Identified       Comments  Patient's initial QOL score was 23.02 and his PHQ-9 score was 1 with no psychosocial issues identified at his orientation visit.        Expected Outcomes  Patient will have no psychosocial issues identified at discharge.        Interventions  Relaxation education;Encouraged to attend Cardiac Rehabilitation for the exercise;Stress management education  Continue Psychosocial Services   No Follow up required          Psychosocial Discharge (Final Psychosocial Re-Evaluation): Psychosocial Re-Evaluation - 08/27/17 1554      Psychosocial Re-Evaluation   Current issues with  None Identified    Comments  Patient's initial QOL score was 23.02 and his PHQ-9 score was 1 with no psychosocial issues identified at his orientation visit.     Expected Outcomes  Patient will have no psychosocial issues identified at discharge.     Interventions  Relaxation education;Encouraged to attend Cardiac Rehabilitation for the exercise;Stress management education    Continue Psychosocial Services   No Follow up required       Vocational Rehabilitation: Provide vocational rehab assistance to qualifying candidates.   Vocational Rehab Evaluation & Intervention: Vocational Rehab - 07/24/17 1118      Initial  Vocational Rehab Evaluation & Intervention   Assessment shows need for Vocational Rehabilitation  No       Education: Education Goals: Education classes will be provided on a weekly basis, covering required topics. Participant will state understanding/return demonstration of topics presented.  Learning Barriers/Preferences: Learning Barriers/Preferences - 07/24/17 1117      Learning Barriers/Preferences   Learning Barriers  None    Learning Preferences  Written Material;Pictoral;Video       Education Topics: Hypertension, Hypertension Reduction -Define heart disease and high blood pressure. Discus how high blood pressure affects the body and ways to reduce high blood pressure.   Exercise and Your Heart -Discuss why it is important to exercise, the FITT principles of exercise, normal and abnormal responses to exercise, and how to exercise safely.   CARDIAC REHAB PHASE II EXERCISE from 08/26/2017 in [place]  Date  07/29/17  Educator  [place]  Instruction Review Code  2- Demonstrated Understanding      Angina -Discuss definition of angina, causes of angina, treatment of angina, and how to decrease risk of having angina.   CARDIAC REHAB PHASE II EXERCISE from 08/26/2017 in [place]  Date  08/05/17  Educator  [place]  Instruction Review Code  2- Demonstrated Understanding      Cardiac Medications -Review what the following cardiac medications are used for, how they affect the body, and side effects that may occur when taking the medications.  Medications include Aspirin, Beta blockers, calcium channel blockers, ACE Inhibitors, angiotensin receptor blockers, diuretics, digoxin, and antihyperlipidemics.   CARDIAC REHAB PHASE II EXERCISE from 08/26/2017 in [place]  Date  08/12/17  Educator  [place]  Instruction Review Code  2- Demonstrated Understanding      Congestive Heart Failure -Discuss the definition of CHF,  how to live with CHF, the signs and symptoms of CHF, and how keep track of weight and sodium intake.   CARDIAC REHAB PHASE II EXERCISE from 08/26/2017 in [place]  Date  08/19/17  Educator  DC  Instruction Review Code  2- Demonstrated Understanding      Heart Disease and Intimacy -Discus the effect sexual activity has on the heart, how changes occur during intimacy as we age, and safety during sexual activity.   CARDIAC REHAB PHASE II EXERCISE from 08/26/2017 in [place]  Date  08/26/17  Educator  [place]  Instruction Review Code  2- Demonstrated Understanding      Smoking Cessation / COPD -Discuss different methods to quit smoking, the health benefits of quitting smoking, and the definition of COPD.   Nutrition  I: Fats -Discuss the types of cholesterol, what cholesterol does to the heart, and how cholesterol levels can be controlled.   Nutrition II: Labels -Discuss the different components of food labels and how to read food label   Heart Parts/Heart Disease and PAD -Discuss the anatomy of the heart, the pathway of blood circulation through the heart, and these are affected by heart disease.   Stress I: Signs and Symptoms -Discuss the causes of stress, how stress may lead to anxiety and depression, and ways to limit stress.   Stress II: Relaxation -Discuss different types of relaxation techniques to limit stress.   Warning Signs of Stroke / TIA -Discuss definition of a stroke, what the signs and symptoms are of a stroke, and how to identify when someone is having stroke.   Knowledge Questionnaire Score: Knowledge Questionnaire Score - 07/24/17 1118      Knowledge Questionnaire Score   Pre Score  20/24       Core Components/Risk Factors/Patient Goals at Admission: Personal Goals and Risk Factors at Admission - 07/24/17 1127      Core Components/Risk Factors/Patient Goals on Admission    Weight Management  Weight  Maintenance    Personal Goal Other  Yes    Personal Goal  Get back to normal ADL's, have less back pain and to be more mobile    Intervention  Attend CR 3 x week and supplement with home exercise 2 x week.     Expected Outcomes  Achieve personal goals       Core Components/Risk Factors/Patient Goals Review:  Goals and Risk Factor Review    Row Name 08/27/17 1550             Core Components/Risk Factors/Patient Goals Review   Personal Goals Review  Weight Management/Obesity;Diabetes Get back to normal ADL's; have less back pain; be mobile.        Review  Patient has completed 14 sessions gaining one lb since last 30 day review. He says his weight fluctuates and he has had some recent medication adjustments in his diuretic medication. He is doing well in the program with progression. He says his back pain remains the same. He says he feels like his strength has increased in his upper body and he has recently done some gardening with improved stamina. He feels like he has more energy. His last A1C was 06/01/17 at 9.1. He reported fasting glucose readings have been averagin 125. Will continue to monitor for progress.        Expected Outcomes  Patient will continue to attend sessions and will complete the program and meet his personal goals.           Core Components/Risk Factors/Patient Goals at Discharge (Final Review):  Goals and Risk Factor Review - 08/27/17 1550      Core Components/Risk Factors/Patient Goals Review   Personal Goals Review  Weight Management/Obesity;Diabetes Get back to normal ADL's; have less back pain; be mobile.     Review  Patient has completed 14 sessions gaining one lb since last 30 day review. He says his weight fluctuates and he has had some recent medication adjustments in his diuretic medication. He is doing well in the program with progression. He says his back pain remains the same. He says he feels like his strength has increased in his upper body and he has  recently done some gardening with improved stamina. He feels like he has more energy. His last A1C was 06/01/17 at 9.1.  He reported fasting glucose readings have been averagin 125. Will continue to monitor for progress.     Expected Outcomes  Patient will continue to attend sessions and will complete the program and meet his personal goals.        ITP Comments: ITP Comments    Row Name 07/24/17 1120 08/03/17 0755         ITP Comments  Mr. Garate is a pleasant 84 year old gentleman. He has had NSTEMI with stent placment. He also c/o have (L) knee pain, and back pain. he states that he still has a heart blockage that his cardilogist is watching. His wife is very supportive and patient is eager to get started.   Patient new to program. He has completed 4 sessions. Will continue to monitor for progress.          Comments: ITP 30 Day REVIEW Patient doing well in the program. Will continue to monitor for progress.

## 2017-08-28 ENCOUNTER — Encounter (HOSPITAL_COMMUNITY)
Admission: RE | Admit: 2017-08-28 | Discharge: 2017-08-28 | Disposition: A | Discharge status: Home / Self Care | Payer: Medicare Other | Source: Ambulatory Visit | Attending: Cardiology | Admitting: Cardiology

## 2017-08-28 DIAGNOSIS — Z955 Presence of coronary angioplasty implant and graft: Secondary | ICD-10-CM

## 2017-08-28 DIAGNOSIS — I214 Non-ST elevation (NSTEMI) myocardial infarction: Secondary | ICD-10-CM | POA: Diagnosis not present

## 2017-08-28 DIAGNOSIS — Z48812 Encounter for surgical aftercare following surgery on the circulatory system: Secondary | ICD-10-CM | POA: Diagnosis not present

## 2017-08-28 NOTE — Progress Notes (Signed)
Daily Session Note  Patient Details  Name: Erik Logan MRN: [id number] Date of Birth: December 01, 1928 Referring Provider:     CARDIAC REHAB PHASE II ORIENTATION from 07/24/2017 in [place]  Referring Provider  Dr. Stanford Breed      Encounter Date: 08/28/2017  Check In: Session Check In - 08/28/17 1010      Check-In   Location  [facility]    Staff Present  Diane Angelina Pih, MS, EP, [facility], Exercise Physiologist;Debra Wynetta Emery, RN, BSN;Mehul Rudin, BS, EP, Exercise Physiologist    Supervising physician immediately available to respond to emergencies  See telemetry face sheet for immediately available MD    Medication changes reported      No    Fall or balance concerns reported     No    Warm-up and Cool-down  Performed as group-led instruction    Resistance Training Performed  Yes    VAD Patient?  No      Pain Assessment   Currently in Pain?  No/denies    Pain Score  0-No pain    Multiple Pain Sites  No       Capillary Blood Glucose: No results found for this or any previous visit (from the past 24 hour(s)).    Social History   Tobacco Use  Smoking Status Former Smoker  . Packs/day: 1.00  . Years: 32.00  . Pack years: 32.00  . Types: Cigarettes  Smokeless Tobacco Never Used  Tobacco Comment   Quit around age 50    Goals Met:  Independence with exercise equipment Exercise tolerated well No report of cardiac concerns or symptoms Strength training completed today  Goals Unmet:  Not Applicable  Comments: Check out 1030   Dr. Kate Sable is Medical Director for [facility] and Pulmonary Rehab.

## 2017-08-31 ENCOUNTER — Encounter (HOSPITAL_COMMUNITY)
Admission: RE | Admit: 2017-08-31 | Discharge: 2017-08-31 | Disposition: A | Discharge status: Home / Self Care | Payer: Medicare Other | Source: Ambulatory Visit | Attending: Cardiology | Admitting: Cardiology

## 2017-08-31 ENCOUNTER — Encounter: Payer: Self-pay | Admitting: Cardiology

## 2017-08-31 DIAGNOSIS — Z48812 Encounter for surgical aftercare following surgery on the circulatory system: Secondary | ICD-10-CM | POA: Diagnosis not present

## 2017-08-31 DIAGNOSIS — Z955 Presence of coronary angioplasty implant and graft: Secondary | ICD-10-CM | POA: Diagnosis not present

## 2017-08-31 DIAGNOSIS — I214 Non-ST elevation (NSTEMI) myocardial infarction: Secondary | ICD-10-CM | POA: Diagnosis not present

## 2017-08-31 NOTE — Progress Notes (Signed)
Daily Session Note  Patient Details  Name: Erik Logan MRN: [id number] Date of Birth: May 18, 1928 Referring Provider:     CARDIAC REHAB PHASE II ORIENTATION from 07/24/2017 in [place]  Referring Provider  Dr. Stanford Breed      Encounter Date: 08/31/2017  Check In: Session Check In - 08/31/17 0930      Check-In   Location  [facility]    Staff Present  Diane Angelina Pih, MS, EP, [facility], Exercise Physiologist;Mazi Brailsford Wynetta Emery, RN, BSN;Gregory Cowan, BS, EP, Exercise Physiologist    Supervising physician immediately available to respond to emergencies  See telemetry face sheet for immediately available MD    Medication changes reported      No    Fall or balance concerns reported     No    Warm-up and Cool-down  Performed as group-led instruction    Resistance Training Performed  Yes    VAD Patient?  No      Pain Assessment   Currently in Pain?  No/denies    Pain Score  0-No pain    Multiple Pain Sites  No       Capillary Blood Glucose: No results found for this or any previous visit (from the past 24 hour(s)).    Social History   Tobacco Use  Smoking Status Former Smoker  . Packs/day: 1.00  . Years: 32.00  . Pack years: 32.00  . Types: Cigarettes  Smokeless Tobacco Never Used  Tobacco Comment   Quit around age 55    Goals Met:  Independence with exercise equipment Exercise tolerated well No report of cardiac concerns or symptoms Strength training completed today  Goals Unmet:  Not Applicable  Comments: Check out 1030.   Dr. Kate Sable is Medical Director for [facility] Cardiac and Pulmonary Rehab.

## 2017-09-02 ENCOUNTER — Encounter (HOSPITAL_COMMUNITY)
Admission: RE | Admit: 2017-09-02 | Discharge: 2017-09-02 | Disposition: A | Discharge status: Home / Self Care | Payer: Medicare Other | Source: Ambulatory Visit | Attending: Cardiology | Admitting: Cardiology

## 2017-09-02 DIAGNOSIS — Z955 Presence of coronary angioplasty implant and graft: Secondary | ICD-10-CM | POA: Diagnosis not present

## 2017-09-02 DIAGNOSIS — I214 Non-ST elevation (NSTEMI) myocardial infarction: Secondary | ICD-10-CM

## 2017-09-02 DIAGNOSIS — Z48812 Encounter for surgical aftercare following surgery on the circulatory system: Secondary | ICD-10-CM | POA: Diagnosis not present

## 2017-09-02 NOTE — Progress Notes (Signed)
Daily Session Note  Patient Details  Name: Erik Logan MRN: [id number] Date of Birth: 1928/08/24 Referring Provider:     CARDIAC REHAB PHASE II ORIENTATION from 07/24/2017 in [place]  Referring Provider  Dr. Stanford Breed      Encounter Date: 09/02/2017  Check In: Session Check In - 09/02/17 0930      Check-In   Location  [facility]    Staff Present  Diane Angelina Pih, MS, EP, [facility], Exercise Physiologist;Debra Wynetta Emery, RN, BSN;Sarit Sparano, BS, EP, Exercise Physiologist    Supervising physician immediately available to respond to emergencies  See telemetry face sheet for immediately available MD    Medication changes reported      No    Fall or balance concerns reported     No    Warm-up and Cool-down  Performed as group-led instruction    Resistance Training Performed  Yes    VAD Patient?  No      Pain Assessment   Currently in Pain?  No/denies    Pain Score  0-No pain    Multiple Pain Sites  No       Capillary Blood Glucose: No results found for this or any previous visit (from the past 24 hour(s)).    Social History   Tobacco Use  Smoking Status Former Smoker  . Packs/day: 1.00  . Years: 32.00  . Pack years: 32.00  . Types: Cigarettes  Smokeless Tobacco Never Used  Tobacco Comment   Quit around age 65    Goals Met:  Independence with exercise equipment Exercise tolerated well No report of cardiac concerns or symptoms Strength training completed today  Goals Unmet:  Not Applicable  Comments: Check out 1030   Dr. Kate Sable is Medical Director for [place] and Pulmonary Rehab.

## 2017-09-04 ENCOUNTER — Encounter (HOSPITAL_COMMUNITY)
Admission: RE | Admit: 2017-09-04 | Discharge: 2017-09-04 | Disposition: A | Discharge status: Home / Self Care | Payer: Medicare Other | Source: Ambulatory Visit | Attending: Cardiology | Admitting: Cardiology

## 2017-09-04 DIAGNOSIS — I214 Non-ST elevation (NSTEMI) myocardial infarction: Secondary | ICD-10-CM | POA: Diagnosis not present

## 2017-09-04 DIAGNOSIS — Z48812 Encounter for surgical aftercare following surgery on the circulatory system: Secondary | ICD-10-CM | POA: Diagnosis not present

## 2017-09-04 DIAGNOSIS — Z955 Presence of coronary angioplasty implant and graft: Secondary | ICD-10-CM

## 2017-09-04 NOTE — Progress Notes (Signed)
Daily Session Note  Patient Details  Name: Erik Logan MRN: [id number] Date of Birth: March 19, 1928 Referring Provider:     CARDIAC REHAB PHASE II ORIENTATION from 07/24/2017 in [place]  Referring Provider  Dr. Stanford Breed      Encounter Date: 09/04/2017  Check In: Session Check In - 09/04/17 0930      Check-In   Location  [facility]    Staff Present  Diane Angelina Pih, MS, EP, [facility], Exercise Physiologist;Debra Wynetta Emery, RN, BSN;Levana Minetti, BS, EP, Exercise Physiologist    Supervising physician immediately available to respond to emergencies  See telemetry face sheet for immediately available MD    Medication changes reported      No    Fall or balance concerns reported     No    Warm-up and Cool-down  Performed as group-led instruction    Resistance Training Performed  Yes    VAD Patient?  No      Pain Assessment   Currently in Pain?  No/denies    Pain Score  0-No pain    Multiple Pain Sites  No       Capillary Blood Glucose: No results found for this or any previous visit (from the past 24 hour(s)).    Social History   Tobacco Use  Smoking Status Former Smoker  . Packs/day: 1.00  . Years: 32.00  . Pack years: 32.00  . Types: Cigarettes  Smokeless Tobacco Never Used  Tobacco Comment   Quit around age 42    Goals Met:  Independence with exercise equipment Exercise tolerated well No report of cardiac concerns or symptoms Strength training completed today  Goals Unmet:  Not Applicable  Comments: Check out 1030   Dr. Kate Sable is Medical Director for [facility] and Pulmonary Rehab.

## 2017-09-07 ENCOUNTER — Encounter (HOSPITAL_COMMUNITY)
Admission: RE | Admit: 2017-09-07 | Discharge: 2017-09-07 | Disposition: A | Discharge status: Home / Self Care | Payer: Medicare Other | Source: Ambulatory Visit | Attending: Cardiology | Admitting: Cardiology

## 2017-09-07 DIAGNOSIS — I214 Non-ST elevation (NSTEMI) myocardial infarction: Secondary | ICD-10-CM | POA: Diagnosis not present

## 2017-09-07 DIAGNOSIS — Z955 Presence of coronary angioplasty implant and graft: Secondary | ICD-10-CM

## 2017-09-07 DIAGNOSIS — Z48812 Encounter for surgical aftercare following surgery on the circulatory system: Secondary | ICD-10-CM | POA: Diagnosis not present

## 2017-09-07 NOTE — Progress Notes (Signed)
Daily Session Note  Patient Details  Name: Erik Logan MRN: [id number] Date of Birth: 29-Oct-1928 Referring Provider:     CARDIAC REHAB PHASE II ORIENTATION from 07/24/2017 in [place]  Referring Provider  Dr. Stanford Breed      Encounter Date: 09/07/2017  Check In: Session Check In - 09/07/17 0926      Check-In   Location  [facility]    Staff Present  Russella Dar, MS, EP, [facility], Exercise Physiologist;Debra Wynetta Emery, RN, BSN;Gagandeep Pettet, BS, EP, Exercise Physiologist    Supervising physician immediately available to respond to emergencies  See telemetry face sheet for immediately available MD    Medication changes reported      No    Fall or balance concerns reported     No    Warm-up and Cool-down  Performed as group-led instruction    Resistance Training Performed  Yes    VAD Patient?  No      Pain Assessment   Currently in Pain?  No/denies    Pain Score  0-No pain    Multiple Pain Sites  No       Capillary Blood Glucose: No results found for this or any previous visit (from the past 24 hour(s)).    Social History   Tobacco Use  Smoking Status Former Smoker  . Packs/day: 1.00  . Years: 32.00  . Pack years: 32.00  . Types: Cigarettes  Smokeless Tobacco Never Used  Tobacco Comment   Quit around age 65    Goals Met:  Independence with exercise equipment Exercise tolerated well No report of cardiac concerns or symptoms Strength training completed today  Goals Unmet:  Not Applicable  Comments: Check out 1030   Dr. Kate Sable is Medical Director for [place] and Pulmonary Rehab.

## 2017-09-09 ENCOUNTER — Encounter (HOSPITAL_COMMUNITY)
Admission: RE | Admit: 2017-09-09 | Discharge: 2017-09-09 | Disposition: A | Discharge status: Home / Self Care | Payer: Medicare Other | Source: Ambulatory Visit | Attending: Cardiology | Admitting: Cardiology

## 2017-09-09 DIAGNOSIS — Z955 Presence of coronary angioplasty implant and graft: Secondary | ICD-10-CM | POA: Diagnosis not present

## 2017-09-09 DIAGNOSIS — I214 Non-ST elevation (NSTEMI) myocardial infarction: Secondary | ICD-10-CM

## 2017-09-09 DIAGNOSIS — Z48812 Encounter for surgical aftercare following surgery on the circulatory system: Secondary | ICD-10-CM | POA: Diagnosis not present

## 2017-09-09 NOTE — Progress Notes (Signed)
Daily Session Note  Patient Details  Name: Erik Logan MRN: [id number] Date of Birth: Apr 04, 1928 Referring Provider:     CARDIAC REHAB PHASE II ORIENTATION from 07/24/2017 in [place]  Referring Provider  Dr. Stanford Breed      Encounter Date: 09/09/2017  Check In: Session Check In - 09/09/17 0928      Check-In   Location  [facility]    Staff Present  Russella Dar, MS, EP, [facility], Exercise Physiologist;Debra Wynetta Emery, RN, BSN;Pualani Borah, BS, EP, Exercise Physiologist    Supervising physician immediately available to respond to emergencies  See telemetry face sheet for immediately available MD    Medication changes reported      No    Fall or balance concerns reported     No    Warm-up and Cool-down  Performed as group-led instruction    Resistance Training Performed  Yes    VAD Patient?  No      Pain Assessment   Currently in Pain?  No/denies    Pain Score  0-No pain    Multiple Pain Sites  No       Capillary Blood Glucose: No results found for this or any previous visit (from the past 24 hour(s)).    Social History   Tobacco Use  Smoking Status Former Smoker  . Packs/day: 1.00  . Years: 32.00  . Pack years: 32.00  . Types: Cigarettes  Smokeless Tobacco Never Used  Tobacco Comment   Quit around age 68    Goals Met:  Independence with exercise equipment Exercise tolerated well No report of cardiac concerns or symptoms Strength training completed today  Goals Unmet:  Not Applicable  Comments: Check out 1030   Dr. Kate Sable is Medical Director for [place] and Pulmonary Rehab.

## 2017-09-11 ENCOUNTER — Encounter (HOSPITAL_COMMUNITY)
Admission: RE | Admit: 2017-09-11 | Discharge: 2017-09-11 | Disposition: A | Discharge status: Home / Self Care | Payer: Medicare Other | Source: Ambulatory Visit | Attending: Cardiology | Admitting: Cardiology

## 2017-09-11 DIAGNOSIS — Z955 Presence of coronary angioplasty implant and graft: Secondary | ICD-10-CM

## 2017-09-11 DIAGNOSIS — Z48812 Encounter for surgical aftercare following surgery on the circulatory system: Secondary | ICD-10-CM | POA: Diagnosis not present

## 2017-09-11 DIAGNOSIS — I214 Non-ST elevation (NSTEMI) myocardial infarction: Secondary | ICD-10-CM

## 2017-09-11 NOTE — Progress Notes (Signed)
Daily Session Note  Patient Details  Name: Erik Logan MRN: [id number] Date of Birth: 04/09/1928 Referring Provider:     CARDIAC REHAB PHASE II ORIENTATION from 07/24/2017 in [place] CARDIAC REHABILITATION  Referring Provider  Dr. Crenshaw      Encounter Date: 09/11/2017  Check In: Session Check In - 09/11/17 0930      Check-In   Location  [facility]    Staff Present  Debra Johnson, RN, BSN;Steffan Caniglia, BS, EP, Exercise Physiologist    Supervising physician immediately available to respond to emergencies  See telemetry face sheet for immediately available MD    Medication changes reported      No    Fall or balance concerns reported     No    Warm-up and Cool-down  Performed as group-led instruction    Resistance Training Performed  Yes    VAD Patient?  No    PAD/SET Patient?  No      Pain Assessment   Currently in Pain?  No/denies    Pain Score  0-No pain    Multiple Pain Sites  No       Capillary Blood Glucose: No results found for this or any previous visit (from the past 24 hour(s)).    Social History   Tobacco Use  Smoking Status Former Smoker  . Packs/day: 1.00  . Years: 32.00  . Pack years: 32.00  . Types: Cigarettes  Smokeless Tobacco Never Used  Tobacco Comment   Quit around age 52    Goals Met:  Independence with exercise equipment Exercise tolerated well No report of cardiac concerns or symptoms Strength training completed today  Goals Unmet:  Not Applicable  Comments: Check out 1030   Dr. Suresh Koneswaran is Medical Director for [place] Card[facility] and Pulmonary Rehab. 

## 2017-09-14 ENCOUNTER — Encounter (HOSPITAL_COMMUNITY)
Admission: RE | Admit: 2017-09-14 | Discharge: 2017-09-14 | Disposition: A | Discharge status: Home / Self Care | Payer: Medicare Other | Source: Ambulatory Visit | Attending: Cardiology | Admitting: Cardiology

## 2017-09-14 DIAGNOSIS — Z955 Presence of coronary angioplasty implant and graft: Secondary | ICD-10-CM | POA: Diagnosis not present

## 2017-09-14 DIAGNOSIS — M545 Low back pain: Secondary | ICD-10-CM | POA: Diagnosis not present

## 2017-09-14 DIAGNOSIS — I10 Essential (primary) hypertension: Secondary | ICD-10-CM | POA: Diagnosis not present

## 2017-09-14 DIAGNOSIS — E1165 Type 2 diabetes mellitus with hyperglycemia: Secondary | ICD-10-CM | POA: Diagnosis not present

## 2017-09-14 DIAGNOSIS — Z48812 Encounter for surgical aftercare following surgery on the circulatory system: Secondary | ICD-10-CM | POA: Diagnosis not present

## 2017-09-14 DIAGNOSIS — I214 Non-ST elevation (NSTEMI) myocardial infarction: Secondary | ICD-10-CM | POA: Diagnosis not present

## 2017-09-14 NOTE — Progress Notes (Signed)
Daily Session Note  Patient Details  Name: Erik Logan MRN: [id number] Date of Birth: 1928-04-03 Referring Provider:     CARDIAC REHAB PHASE II ORIENTATION from 07/24/2017 in [place]  Referring Provider  Dr. Stanford Breed      Encounter Date: 09/14/2017  Check In: Session Check In - 09/14/17 0930      Check-In   Location  [facility]    Staff Present  Aundra Dubin, RN, BSN;Thi Sisemore Luther Parody, BS, EP, Exercise Physiologist;Diane Coad, MS, EP, [facility], Exercise Physiologist    Supervising physician immediately available to respond to emergencies  See telemetry face sheet for immediately available MD    Medication changes reported      No    Fall or balance concerns reported     No    Warm-up and Cool-down  Performed as group-led instruction    Resistance Training Performed  Yes    VAD Patient?  No    PAD/SET Patient?  No      Pain Assessment   Currently in Pain?  No/denies    Pain Score  0-No pain    Multiple Pain Sites  No       Capillary Blood Glucose: No results found for this or any previous visit (from the past 24 hour(s)).    Social History   Tobacco Use  Smoking Status Former Smoker  . Packs/day: 1.00  . Years: 32.00  . Pack years: 32.00  . Types: Cigarettes  Smokeless Tobacco Never Used  Tobacco Comment   Quit around age 44    Goals Met:  Independence with exercise equipment Exercise tolerated well No report of cardiac concerns or symptoms Strength training completed today  Goals Unmet:  Not Applicable  Comments: Check out 1030   Dr. Kate Sable is Medical Director for [place] and Pulmonary Rehab.

## 2017-09-16 ENCOUNTER — Encounter (HOSPITAL_COMMUNITY)
Admission: RE | Admit: 2017-09-16 | Discharge: 2017-09-16 | Disposition: A | Discharge status: Home / Self Care | Payer: Medicare Other | Source: Ambulatory Visit | Attending: Cardiology | Admitting: Cardiology

## 2017-09-16 DIAGNOSIS — Z48812 Encounter for surgical aftercare following surgery on the circulatory system: Secondary | ICD-10-CM | POA: Diagnosis not present

## 2017-09-16 DIAGNOSIS — I214 Non-ST elevation (NSTEMI) myocardial infarction: Secondary | ICD-10-CM

## 2017-09-16 DIAGNOSIS — Z955 Presence of coronary angioplasty implant and graft: Secondary | ICD-10-CM

## 2017-09-16 NOTE — Progress Notes (Signed)
Daily Session Note  Patient Details  Name: Erik Logan MRN: [id number] Date of Birth: 10-21-1928 Referring Provider:     CARDIAC REHAB PHASE II ORIENTATION from 07/24/2017 in [place]  Referring Provider  Dr. Stanford Breed      Encounter Date: 09/16/2017  Check In: Session Check In - 09/16/17 0930      Check-In   Location  [facility]    Staff Present  Aundra Dubin, RN, BSN;Mabelle Mungin Luther Parody, BS, EP, Exercise Physiologist;Diane Coad, MS, EP, [facility], Exercise Physiologist    Supervising physician immediately available to respond to emergencies  See telemetry face sheet for immediately available MD    Medication changes reported      No    Fall or balance concerns reported     No    Warm-up and Cool-down  Performed as group-led instruction    Resistance Training Performed  Yes    VAD Patient?  No    PAD/SET Patient?  No      Pain Assessment   Currently in Pain?  No/denies    Pain Score  0-No pain    Multiple Pain Sites  No       Capillary Blood Glucose: No results found for this or any previous visit (from the past 24 hour(s)).    Social History   Tobacco Use  Smoking Status Former Smoker  . Packs/day: 1.00  . Years: 32.00  . Pack years: 32.00  . Types: Cigarettes  Smokeless Tobacco Never Used  Tobacco Comment   Quit around age 67    Goals Met:  Independence with exercise equipment Exercise tolerated well No report of cardiac concerns or symptoms Strength training completed today  Goals Unmet:  Not Applicable  Comments: Check out 1030   Dr. Kate Sable is Medical Director for [employer] and Pulmonary Rehab.

## 2017-09-18 ENCOUNTER — Encounter (HOSPITAL_COMMUNITY)
Admission: RE | Admit: 2017-09-18 | Discharge: 2017-09-18 | Disposition: A | Discharge status: Home / Self Care | Payer: Medicare Other | Source: Ambulatory Visit | Attending: Cardiology | Admitting: Cardiology

## 2017-09-18 DIAGNOSIS — Z955 Presence of coronary angioplasty implant and graft: Secondary | ICD-10-CM | POA: Diagnosis not present

## 2017-09-18 DIAGNOSIS — I214 Non-ST elevation (NSTEMI) myocardial infarction: Secondary | ICD-10-CM

## 2017-09-18 DIAGNOSIS — Z48812 Encounter for surgical aftercare following surgery on the circulatory system: Secondary | ICD-10-CM | POA: Diagnosis not present

## 2017-09-18 NOTE — Progress Notes (Signed)
Daily Session Note  Patient Details  Name: Erik Logan MRN: [id number] Date of Birth: 10/22/1928 Referring Provider:     CARDIAC REHAB PHASE II ORIENTATION from 07/24/2017 in [place]  Referring Provider  Dr. Stanford Breed      Encounter Date: 09/18/2017  Check In: Session Check In - 09/18/17 0930      Check-In   Location  [facility]    Staff Present  Aundra Dubin, RN, BSN;Hevin Jeffcoat Luther Parody, BS, EP, Exercise Physiologist;Diane Coad, MS, EP, [facility], Exercise Physiologist    Supervising physician immediately available to respond to emergencies  See telemetry face sheet for immediately available MD    Medication changes reported      No    Fall or balance concerns reported     No    Warm-up and Cool-down  Performed as group-led instruction    Resistance Training Performed  Yes    VAD Patient?  No    PAD/SET Patient?  No      Pain Assessment   Currently in Pain?  No/denies    Pain Score  0-No pain    Multiple Pain Sites  No       Capillary Blood Glucose: No results found for this or any previous visit (from the past 24 hour(s)).    Social History   Tobacco Use  Smoking Status Former Smoker  . Packs/day: 1.00  . Years: 32.00  . Pack years: 32.00  . Types: Cigarettes  Smokeless Tobacco Never Used  Tobacco Comment   Quit around age 20    Goals Met:  Independence with exercise equipment Exercise tolerated well No report of cardiac concerns or symptoms Strength training completed today  Goals Unmet:  Not Applicable  Comments: Check out 1030   Dr. Kate Sable is Medical Director for [place] and Pulmonary Rehab.

## 2017-09-21 ENCOUNTER — Encounter (HOSPITAL_COMMUNITY)
Admission: RE | Admit: 2017-09-21 | Discharge: 2017-09-21 | Disposition: A | Discharge status: Home / Self Care | Payer: Medicare Other | Source: Ambulatory Visit | Attending: Cardiology | Admitting: Cardiology

## 2017-09-21 DIAGNOSIS — I214 Non-ST elevation (NSTEMI) myocardial infarction: Secondary | ICD-10-CM

## 2017-09-21 DIAGNOSIS — Z48812 Encounter for surgical aftercare following surgery on the circulatory system: Secondary | ICD-10-CM | POA: Diagnosis not present

## 2017-09-21 DIAGNOSIS — Z955 Presence of coronary angioplasty implant and graft: Secondary | ICD-10-CM

## 2017-09-21 NOTE — Progress Notes (Signed)
Daily Session Note  Patient Details  Name: Erik Logan MRN: [id number] Date of Birth: 04/12/1928 Referring Provider:     CARDIAC REHAB PHASE II ORIENTATION from 07/24/2017 in [place]  Referring Provider  Dr. Stanford Breed      Encounter Date: 09/21/2017  Check In: Session Check In - 09/21/17 0930      Check-In   Location  [facility]    Staff Present  Aundra Dubin, RN, BSN;Gregory Luther Parody, BS, EP, Exercise Physiologist;Diane Coad, MS, EP, [facility], Exercise Physiologist    Supervising physician immediately available to respond to emergencies  See telemetry face sheet for immediately available MD    Medication changes reported      No    Fall or balance concerns reported     No    Warm-up and Cool-down  Performed as group-led instruction    Resistance Training Performed  Yes    VAD Patient?  No    PAD/SET Patient?  No      Pain Assessment   Currently in Pain?  No/denies    Pain Score  0-No pain    Multiple Pain Sites  No       Capillary Blood Glucose: No results found for this or any previous visit (from the past 24 hour(s)).    Social History   Tobacco Use  Smoking Status Former Smoker  . Packs/day: 1.00  . Years: 32.00  . Pack years: 32.00  . Types: Cigarettes  Smokeless Tobacco Never Used  Tobacco Comment   Quit around age 26    Goals Met:  Independence with exercise equipment Exercise tolerated well No report of cardiac concerns or symptoms Strength training completed today  Goals Unmet:  Not Applicable  Comments: Check out 1030.   Dr. Kate Sable is Medical Director for [facility] Cardiac and Pulmonary Rehab.

## 2017-09-22 DIAGNOSIS — M1712 Unilateral primary osteoarthritis, left knee: Secondary | ICD-10-CM | POA: Diagnosis not present

## 2017-09-23 ENCOUNTER — Encounter (HOSPITAL_COMMUNITY)
Admission: RE | Admit: 2017-09-23 | Discharge: 2017-09-23 | Disposition: A | Discharge status: Home / Self Care | Payer: Medicare Other | Source: Ambulatory Visit | Attending: Cardiology | Admitting: Cardiology

## 2017-09-23 DIAGNOSIS — I214 Non-ST elevation (NSTEMI) myocardial infarction: Secondary | ICD-10-CM | POA: Diagnosis not present

## 2017-09-23 DIAGNOSIS — Z955 Presence of coronary angioplasty implant and graft: Secondary | ICD-10-CM

## 2017-09-23 DIAGNOSIS — Z48812 Encounter for surgical aftercare following surgery on the circulatory system: Secondary | ICD-10-CM | POA: Diagnosis not present

## 2017-09-23 NOTE — Progress Notes (Signed)
Cardiac Individual Treatment Plan  Patient Details  Name: Erik Logan MRN: [id number] Date of Birth: 02-01-1929 Referring Provider:     CARDIAC REHAB PHASE II ORIENTATION from 07/24/2017 in [place]  Referring Provider  Dr. Stanford Breed      Initial Encounter Date:    CARDIAC REHAB PHASE II ORIENTATION from 07/24/2017 in [place]  Date  07/24/17      Visit Diagnosis: Status post coronary artery stent placement  NSTEMI (non-ST elevated myocardial infarction) ([facility])  Patient's Home Medications on Admission:  Current Outpatient Medications:  .  amLODipine (NORVASC) 5 MG tablet, Take 1 tablet (5 mg total) by mouth daily., Disp: 90 tablet, Rfl: 1 .  aspirin EC 81 MG tablet, Take 1 tablet (81 mg total) by mouth daily., Disp: , Rfl:  .  atorvastatin (LIPITOR) 80 MG tablet, Take 1 tablet (80 mg total) by mouth daily at 6 PM., Disp: 30 tablet, Rfl: 6 .  clopidogrel (PLAVIX) 75 MG tablet, Take 75 mg by mouth daily., Disp: , Rfl:  .  furosemide (LASIX) 40 MG tablet, TAKE 20 MG DAILY MAY TAKE 40 MG AS NEEDED FOR SWELLING, Disp: 135 tablet, Rfl: 1 .  insulin lispro (HUMALOG) 100 UNIT/ML injection, Inject 5 Units into the skin See admin instructions. Per Sliding Scale, Disp: , Rfl:  .  isosorbide mononitrate (IMDUR) 30 MG 24 hr tablet, Take 1 tablet (30 mg total) by mouth daily., Disp: 30 tablet, Rfl: 6 .  metoprolol tartrate (LOPRESSOR) 25 MG tablet, Take 0.5 tablets (12.5 mg total) by mouth daily., Disp: 30 tablet, Rfl: 6 .  nitroGLYCERIN (NITROSTAT) 0.4 MG SL tablet, Place 1 tablet (0.4 mg total) under the tongue every 5 (five) minutes x 3 doses as needed for chest pain., Disp: 25 tablet, Rfl: 12 .  Polyethyl Glycol-Propyl Glycol (SYSTANE OP), Apply 2 drops to eye daily as needed (Dry eyes)., Disp: , Rfl:  .  TRESIBA FLEXTOUCH 100 UNIT/ML SOPN FlexTouch Pen, Inject 25 Units into the skin every morning. , Disp: , Rfl:   Past Medical History: Past  Medical History:  Diagnosis Date  . Chronic renal insufficiency, stage 2 (mild)   . Coronary artery disease   . History of duodenal ulcer 1970s  . Hyperlipidemia   . Hypertension   . NSTEMI (non-ST elevated myocardial infarction) ([facility]) 06/01/2017  . Type II diabetes mellitus ([facility])     Tobacco Use: Social History   Tobacco Use  Smoking Status Former Smoker  . Packs/day: 1.00  . Years: 32.00  . Pack years: 32.00  . Types: Cigarettes  Smokeless Tobacco Never Used  Tobacco Comment   Quit around age 27    Labs: Recent Review Flowsheet Data    Labs for ITP Cardiac and Pulmonary Rehab Latest Ref Rng & Units 06/01/2017 06/02/2017   Cholestrol 0 - 200 mg/dL - 171   LDLCALC 0 - 99 mg/dL - 116(H)   HDL >40 mg/dL - 34(L)   Trlycerides <150 mg/dL - 106   Hemoglobin A1c 4.8 - 5.6 % 9.1(H) -      Capillary Blood Glucose: Lab Results  Component Value Date   GLUCAP 239 (H) 06/03/2017   GLUCAP 152 (H) 06/03/2017   GLUCAP 151 (H) 06/02/2017   GLUCAP 156 (H) 06/02/2017   GLUCAP 131 (H) 06/02/2017     Exercise Target Goals:    Exercise Program Goal: Individual exercise prescription set using results from initial 6 min walk test and THRR while considering  patient's  activity barriers and safety.   Exercise Prescription Goal: Starting with aerobic activity 30 plus minutes a day, 3 days per week for initial exercise prescription. Provide home exercise prescription and guidelines that participant acknowledges understanding prior to discharge.  Activity Barriers & Risk Stratification: Activity Barriers & Cardiac Risk Stratification - 07/24/17 1013      Activity Barriers & Cardiac Risk Stratification   Activity Barriers  Back Problems;Deconditioning;Other (comment) Chronic (L) knee paing that flares up from time to time.    Comments  Lightheadedness during walk test. He states he does not experience being light headed at home.     Cardiac Risk Stratification  High       6 Minute  Walk: 6 Minute Walk    Row Name 07/24/17 1012         6 Minute Walk   Phase  Initial     Distance  1250 feet     Distance % Change  0 %     Distance Feet Change  0 ft     Walk Time  6 minutes     # of Rest Breaks  0     MPH  2.36     METS  2.81     RPE  13     Perceived Dyspnea   11     VO2 Peak  6.23     Symptoms  Yes (comment)     Comments  Lightheadedness     Resting HR  57 bpm     Resting BP  132/62     Resting Oxygen Saturation   100 %     Exercise Oxygen Saturation  during 6 min walk  89 %     Max Ex. HR  83 bpm     Max Ex. BP  148/68     2 Minute Post BP  136/64        Oxygen Initial Assessment:   Oxygen Re-Evaluation:   Oxygen Discharge (Final Oxygen Re-Evaluation):   Initial Exercise Prescription: Initial Exercise Prescription - 07/24/17 1000      Date of Initial Exercise RX and Referring Provider   Date  07/24/17    Referring Provider  Dr. Stanford Breed      Treadmill   MPH  1.2    Grade  0    Minutes  15    METs  1.9      NuStep   Level  0    SPM  72    Minutes  20    METs  1.5      Prescription Details   Frequency (times per week)  3    Duration  Progress to 30 minutes of continuous aerobic without signs/symptoms of physical distress      Intensity   THRR 40-80% of Max Heartrate  [phone]    Ratings of Perceived Exertion  11-13    Perceived Dyspnea  0-4      Progression   Progression  Continue progressive overload as per policy without signs/symptoms or physical distress.      Resistance Training   Training Prescription  Yes    Weight  1    Reps  10-15       Perform Capillary Blood Glucose checks as needed.  Exercise Prescription Changes:  Exercise Prescription Changes    Row Name 07/27/17 1200 07/30/17 1500 08/14/17 1400 09/01/17 0700 09/15/17 0700     Response to Exercise   Blood Pressure (Admit)  -  120/60  142/60  154/68  142/62   Blood Pressure (Exercise)  -  144/60  152/70  148/62  140/88   Blood Pressure (Exit)  -   130/50  128/68  150/88  140/70   Heart Rate (Admit)  -  62 bpm  60 bpm  53 bpm  56 bpm   Heart Rate (Exercise)  -  84 bpm  100 bpm  81 bpm  80 bpm   Heart Rate (Exit)  -  70 bpm  69 bpm  63 bpm  64 bpm   Rating of Perceived Exertion (Exercise)  -  11  11  10  11    Duration  -  Progress to 30 minutes of  aerobic without signs/symptoms of physical distress  Progress to 30 minutes of  aerobic without signs/symptoms of physical distress  Progress to 30 minutes of  aerobic without signs/symptoms of physical distress  Progress to 30 minutes of  aerobic without signs/symptoms of physical distress   Intensity  -  THRR New [phone]  THRR New 90-104-119  THRR New 85-102-118  THRR New 87-103-118     Progression   Progression  -  Continue to progress workloads to maintain intensity without signs/symptoms of physical distress.  Continue to progress workloads to maintain intensity without signs/symptoms of physical distress.  Continue to progress workloads to maintain intensity without signs/symptoms of physical distress.  Continue to progress workloads to maintain intensity without signs/symptoms of physical distress.     Resistance Training   Training Prescription  Yes  Yes  Yes  Yes  Yes   Weight  1  1  1  2  3    Reps  10-15  10-15  10-15  10-15  10-15     Treadmill   MPH  1.2  1.2  1.2  1.2  -   Grade  0  0  0  0  -   Minutes  15  15  15  15   -   METs  1.9  1.9  1.9  1.9  -     NuStep   Level  0  1  1  1  2    SPM  72  87  101  87  93   Minutes  20  20  20  20  20    METs  1.5  2.4  2.2  2.2  1.8     Arm Ergometer   Level  -  -  1.2  -  1.2   Watts  -  -  20  -  18   Minutes  -  -  15  -  15   METs  -  -  2.5  -  2.4     Home Exercise Plan   Plans to continue exercise at  Home (comment)  Home (comment)  Home (comment)  Home (comment)  Home (comment)   Frequency  Add 2 additional days to program exercise sessions.  Add 2 additional days to program exercise sessions.  Add 2 additional days to  program exercise sessions.  Add 2 additional days to program exercise sessions.  Add 2 additional days to program exercise sessions.   Initial Home Exercises Provided  07/27/17  07/27/17  07/27/17  07/27/17  07/27/17      Exercise Comments:  Exercise Comments    Row Name 07/27/17 1242 07/30/17 1517 08/14/17 1440 09/01/17 0802 09/15/17 0738   Exercise Comments  Patient recieved the take home exercise plan today. THr was  addressed as were safety guidelines for being active when not in CR. Patient demonstrated an understanding and was encouraged to ask any future questions as they arise.   Patient has just started Cr and will be progressed in time in tune to his exercise goals  Patinet is doing well in CR. patient was taken ff of teh treadmill due to dizziness and light headed feeling and was placed on the arm ergometer. patient is doing very well on his two machines and has increased his SPMs on both since being on them   Patient is doing well in CR. Patient was moved back to treadmill due to lack of dizziness. Patient has been handling the change very well. Patient has also increased dumbbell size to 2lbs.   Patient has been doing well in CR. patient has increased the sumbbell size to 3lbs and has also increased his level on the nustep machine. Patient has been moved back to the arm ergometer due to knee and joint pain      Exercise Goals and Review:  Exercise Goals    Row Name 07/24/17 1015             Exercise Goals   Increase Physical Activity  Yes       Intervention  Provide advice, education, support and counseling about physical activity/exercise needs.;Develop an individualized exercise prescription for aerobic and resistive training based on initial evaluation findings, risk stratification, comorbidities and participant's personal goals.       Expected Outcomes  Short Term: Attend rehab on a regular basis to increase amount of physical activity.       Increase Strength and Stamina   Yes       Intervention  Provide advice, education, support and counseling about physical activity/exercise needs.;Develop an individualized exercise prescription for aerobic and resistive training based on initial evaluation findings, risk stratification, comorbidities and participant's personal goals.       Expected Outcomes  Short Term: Increase workloads from initial exercise prescription for resistance, speed, and METs.       Able to understand and use rate of perceived exertion (RPE) scale  Yes       Intervention  Provide education and explanation on how to use RPE scale       Expected Outcomes  Short Term: Able to use RPE daily in rehab to express subjective intensity level;Long Term:  Able to use RPE to guide intensity level when exercising independently       Able to understand and use Dyspnea scale  Yes       Intervention  Provide education and explanation on how to use Dyspnea scale       Expected Outcomes  Short Term: Able to use Dyspnea scale daily in rehab to express subjective sense of shortness of breath during exertion;Long Term: Able to use Dyspnea scale to guide intensity level when exercising independently       Knowledge and understanding of Target Heart Rate Range (THRR)  Yes       Intervention  Provide education and explanation of THRR including how the numbers were predicted and where they are located for reference       Expected Outcomes  Short Term: Able to state/look up THRR;Long Term: Able to use THRR to govern intensity when exercising independently;Short Term: Able to use daily as guideline for intensity in rehab       Able to check pulse independently  Yes       Intervention  Provide education  and demonstration on how to check pulse in carotid and radial arteries.;Review the importance of being able to check your own pulse for safety during independent exercise       Expected Outcomes  Short Term: Able to explain why pulse checking is important during independent  exercise;Long Term: Able to check pulse independently and accurately       Understanding of Exercise Prescription  Yes       Intervention  Provide education, explanation, and written materials on patient's individual exercise prescription       Expected Outcomes  Short Term: Able to explain program exercise prescription;Long Term: Able to explain home exercise prescription to exercise independently          Exercise Goals Re-Evaluation : Exercise Goals Re-Evaluation    Row Name 07/30/17 1517 08/25/17 0743 09/21/17 0740         Exercise Goal Re-Evaluation   Exercise Goals Review  Increase Physical Activity;Increase Strength and Stamina;Able to check pulse independently;Able to understand and use Dyspnea scale;Able to understand and use rate of perceived exertion (RPE) scale;Knowledge and understanding of Target Heart Rate Range (THRR);Understanding of Exercise Prescription  Increase Physical Activity;Increase Strength and Stamina;Able to check pulse independently;Able to understand and use Dyspnea scale;Able to understand and use rate of perceived exertion (RPE) scale;Knowledge and understanding of Target Heart Rate Range (THRR);Understanding of Exercise Prescription  Increase Physical Activity;Increase Strength and Stamina;Able to check pulse independently;Able to understand and use Dyspnea scale;Able to understand and use rate of perceived exertion (RPE) scale;Knowledge and understanding of Target Heart Rate Range (THRR);Understanding of Exercise Prescription     Comments  Patient has just started Cr and will be progressed in time in tune to his exercise goals  Patient is doing very well in CR. Patient has tstaed to me that he is really enjoying the program and the exercises that he is doing here. He states that he is eating better and has gained more mobility from the exercise program.   Patient has been doing very well in CR. Patient has stated to me that he has gained more mobility since beginning  his time here in CR. Patient states that he feels no back pain on the machines and that he has less and less dizziness.      Expected Outcomes  Patient wishes to gain less back pain and to gain more mobility and ADLs.  Patient wishes to gain less back pain and to gain more mobility and ADLs.  Patient wishes to gain less back pain and to gain more mobility and ADLs.         Discharge Exercise Prescription (Final Exercise Prescription Changes): Exercise Prescription Changes - 09/15/17 0700      Response to Exercise   Blood Pressure (Admit)  142/62    Blood Pressure (Exercise)  140/88    Blood Pressure (Exit)  140/70    Heart Rate (Admit)  56 bpm    Heart Rate (Exercise)  80 bpm    Heart Rate (Exit)  64 bpm    Rating of Perceived Exertion (Exercise)  11    Duration  Progress to 30 minutes of  aerobic without signs/symptoms of physical distress    Intensity  THRR New [phone]      Progression   Progression  Continue to progress workloads to maintain intensity without signs/symptoms of physical distress.      Resistance Training   Training Prescription  Yes    Weight  3    Reps  10-15      NuStep   Level  2    SPM  93    Minutes  20    METs  1.8      Arm Ergometer   Level  1.2    Watts  18    Minutes  15    METs  2.4      Home Exercise Plan   Plans to continue exercise at  Home (comment)    Frequency  Add 2 additional days to program exercise sessions.    Initial Home Exercises Provided  07/27/17       Nutrition:  Target Goals: Understanding of nutrition guidelines, daily intake of sodium 1500mg , cholesterol 200mg , calories 30% from fat and 7% or less from saturated fats, daily to have 5 or more servings of fruits and vegetables.  Biometrics: Pre Biometrics - 07/24/17 1015      Pre Biometrics   Height  5\' 7"  (1.702 m)    Weight  186 lb 9.6 oz (84.6 kg)    Waist Circumference  19 inches    Hip Circumference  19 inches    Waist to Hip Ratio  1 %    BMI  (Calculated)  29.22    Triceps Skinfold  10 mm    % Body Fat  26.7 %    Grip Strength  70.3 kg    Flexibility  0 in    Single Leg Stand  3 seconds        Nutrition Therapy Plan and Nutrition Goals: Nutrition Therapy & Goals - 08/20/17 1633      Personal Nutrition Goals   Nutrition Goal  For heart healthy choices add >50% of whole grains, make half their plate fruits and vegetables. Discuss the difference between starchy vegetables and leafy greens, and how leafy vegetables provide fiber, helps maintain healthy weight, helps control blood glucose, and lowers cholesterol.  Discuss purchasing fresh or frozen vegetable to reduce sodium and not to add grease, fat or sugar. Consume <18oz of red meat per week. Consume lean cuts of meats and very little of meats high in sodium and nitrates such as pork and lunch meats. Discussed portion control for all food groups.    Personal Goal #2  Patient has been eating a diabetic diet for years. He has added low sodium diet since recent heart event along with heart healthy diet.     Additional Goals?  No      Intervention Plan   Intervention  Nutrition handout(s) given to patient.    Expected Outcomes  Short Term Goal: Understand basic principles of dietary content, such as calories, fat, sodium, cholesterol and nutrients.       Nutrition Assessments: Nutrition Assessments - 07/24/17 1127      MEDFICTS Scores   Pre Score  68       Nutrition Goals Re-Evaluation: Nutrition Goals Re-Evaluation    Row Name 08/27/17 1546 09/23/17 1249           Goals   Current Weight  190 lb 8 oz (86.4 kg)  189 lb 14.4 oz (86.1 kg)      Nutrition Goal  For heart healthy choices add >50% of whole grains, make half their plate fruits and vegetables. Discuss the difference between starchy vegetables and leafy greens, and how leafy vegetables provide fiber, helps maintain healthy weight, helps control blood glucose, and lowers cholesterol.  Discuss purchasing fresh or  frozen vegetable to reduce sodium and not to add grease, fat  or sugar. Consume <18oz of red meat per week. Consume lean cuts of meats and very little of meats high in sodium and nitrates such as pork and lunch meats. Discussed portion control for all food groups.  For heart healthy choices add >50% of whole grains, make half their plate fruits and vegetables. Discuss the difference between starchy vegetables and leafy greens, and how leafy vegetables provide fiber, helps maintain healthy weight, helps control blood glucose, and lowers cholesterol.  Discuss purchasing fresh or frozen vegetable to reduce sodium and not to add grease, fat or sugar. Consume <18oz of red meat per week. Consume lean cuts of meats and very little of meats high in sodium and nitrates such as pork and lunch meats. Discussed portion control for all food groups.      Comment  Patient has gained 2 lbs since last 30 day review. He has had some diuretic medication adjustments and says his weight fluctuates. He says he continues to follow a diabetic, low sodium diet.   Patient has lost 1 lb since last 30 day review. He continues to follow a diabetic, low sodium diet.       Expected Outcome  Patient will continue to meet his nutritional goals.   Patient will continue to meet his nutritional goals.          Nutrition Goals Discharge (Final Nutrition Goals Re-Evaluation): Nutrition Goals Re-Evaluation - 09/23/17 1249      Goals   Current Weight  189 lb 14.4 oz (86.1 kg)    Nutrition Goal  For heart healthy choices add >50% of whole grains, make half their plate fruits and vegetables. Discuss the difference between starchy vegetables and leafy greens, and how leafy vegetables provide fiber, helps maintain healthy weight, helps control blood glucose, and lowers cholesterol.  Discuss purchasing fresh or frozen vegetable to reduce sodium and not to add grease, fat or sugar. Consume <18oz of red meat per week. Consume lean cuts of meats and  very little of meats high in sodium and nitrates such as pork and lunch meats. Discussed portion control for all food groups.    Comment  Patient has lost 1 lb since last 30 day review. He continues to follow a diabetic, low sodium diet.     Expected Outcome  Patient will continue to meet his nutritional goals.        Psychosocial: Target Goals: Acknowledge presence or absence of significant depression and/or stress, maximize coping skills, provide positive support system. Participant is able to verbalize types and ability to use techniques and skills needed for reducing stress and depression.  Initial Review & Psychosocial Screening: Initial Psych Review & Screening - 07/24/17 1135      Initial Review   Current issues with  None Identified      Family Dynamics   Good Support System?  Yes      Barriers   Psychosocial barriers to participate in program  There are no identifiable barriers or psychosocial needs.      Screening Interventions   Interventions  Encouraged to exercise    Expected Outcomes  Short Term goal: Identification and review with participant of any Quality of Life or Depression concerns found by scoring the questionnaire.;Long Term goal: The participant improves quality of Life and PHQ9 Scores as seen by post scores and/or verbalization of changes       Quality of Life Scores: Quality of Life - 07/24/17 1016      Quality of Life Scores  Health/Function Pre  21.37 %    Socioeconomic Pre  25 %    Psych/Spiritual Pre  22.75 %    Family Pre  25.9 %    GLOBAL Pre  23.02 %      Scores of 19 and below usually indicate a poorer quality of life in these areas.  A difference of  2-3 points is a clinically meaningful difference.  A difference of 2-3 points in the total score of the Quality of Life Index has been associated with significant improvement in overall quality of life, self-image, physical symptoms, and general health in studies assessing change in quality of  life.  PHQ-9: Recent Review Flowsheet Data    Depression screen [facility] 2/9 07/24/2017   Decreased Interest 0   Down, Depressed, Hopeless 0   PHQ - 2 Score 0   Altered sleeping 0   Tired, decreased energy 1   Change in appetite 0   Feeling bad or failure about yourself  0   Trouble concentrating 0   Moving slowly or fidgety/restless 0   Suicidal thoughts 0   PHQ-9 Score 1   Difficult doing work/chores Somewhat difficult     Interpretation of Total Score  Total Score Depression Severity:  1-4 = Minimal depression, 5-9 = Mild depression, 10-14 = Moderate depression, 15-19 = Moderately severe depression, 20-27 = Severe depression   Psychosocial Evaluation and Intervention: Psychosocial Evaluation - 07/24/17 1136      Psychosocial Evaluation & Interventions   Interventions  Encouraged to exercise with the program and follow exercise prescription    Continue Psychosocial Services   No Follow up required       Psychosocial Re-Evaluation: Psychosocial Re-Evaluation    [place] Name 08/27/17 1554 09/23/17 1252           Psychosocial Re-Evaluation   Current issues with  None Identified  None Identified      Comments  Patient's initial QOL score was 23.02 and his PHQ-9 score was 1 with no psychosocial issues identified at his orientation visit.   Patient's initial QOL score was 23.02 and his PHQ-9 score was 1 with no psychosocial issues identified at his orientation visit.       Expected Outcomes  Patient will have no psychosocial issues identified at discharge.   Patient will have no psychosocial issues identified at discharge.       Interventions  Relaxation education;Encouraged to attend Cardiac Rehabilitation for the exercise;Stress management education  Relaxation education;Encouraged to attend Cardiac Rehabilitation for the exercise;Stress management education      Continue Psychosocial Services   No Follow up required  No Follow up required         Psychosocial Discharge (Final  Psychosocial Re-Evaluation): Psychosocial Re-Evaluation - 09/23/17 1252      Psychosocial Re-Evaluation   Current issues with  None Identified    Comments  Patient's initial QOL score was 23.02 and his PHQ-9 score was 1 with no psychosocial issues identified at his orientation visit.     Expected Outcomes  Patient will have no psychosocial issues identified at discharge.     Interventions  Relaxation education;Encouraged to attend Cardiac Rehabilitation for the exercise;Stress management education    Continue Psychosocial Services   No Follow up required       Vocational Rehabilitation: Provide vocational rehab assistance to qualifying candidates.   Vocational Rehab Evaluation & Intervention: Vocational Rehab - 07/24/17 1118      Initial Vocational Rehab Evaluation & Intervention   Assessment shows need  for Vocational Rehabilitation  No       Education: Education Goals: Education classes will be provided on a weekly basis, covering required topics. Participant will state understanding/return demonstration of topics presented.  Learning Barriers/Preferences: Learning Barriers/Preferences - 07/24/17 1117      Learning Barriers/Preferences   Learning Barriers  None    Learning Preferences  Written Material;Pictoral;Video       Education Topics: Hypertension, Hypertension Reduction -Define heart disease and high blood pressure. Discus how high blood pressure affects the body and ways to reduce high blood pressure.   Exercise and Your Heart -Discuss why it is important to exercise, the FITT principles of exercise, normal and abnormal responses to exercise, and how to exercise safely.   CARDIAC REHAB PHASE II EXERCISE from 09/16/2017 in [place]  Date  07/29/17  Educator  [place]  Instruction Review Code  2- Demonstrated Understanding      Angina -Discuss definition of angina, causes of angina, treatment of angina, and how to decrease risk of having  angina.   CARDIAC REHAB PHASE II EXERCISE from 09/16/2017 in [place]  Date  08/05/17  Educator  DC  Instruction Review Code  2- Demonstrated Understanding      Cardiac Medications -Review what the following cardiac medications are used for, how they affect the body, and side effects that may occur when taking the medications.  Medications include Aspirin, Beta blockers, calcium channel blockers, ACE Inhibitors, angiotensin receptor blockers, diuretics, digoxin, and antihyperlipidemics.   CARDIAC REHAB PHASE II EXERCISE from 09/16/2017 in [place]  Date  08/12/17  Educator  DC  Instruction Review Code  2- Demonstrated Understanding      Congestive Heart Failure -Discuss the definition of CHF, how to live with CHF, the signs and symptoms of CHF, and how keep track of weight and sodium intake.   CARDIAC REHAB PHASE II EXERCISE from 09/16/2017 in [place]  Date  08/19/17  Educator  DC  Instruction Review Code  2- Demonstrated Understanding      Heart Disease and Intimacy -Discus the effect sexual activity has on the heart, how changes occur during intimacy as we age, and safety during sexual activity.   CARDIAC REHAB PHASE II EXERCISE from 09/16/2017 in [place]  Date  08/26/17  Educator  [place]  Instruction Review Code  2- Demonstrated Understanding      Smoking Cessation / COPD -Discuss different methods to quit smoking, the health benefits of quitting smoking, and the definition of COPD.   CARDIAC REHAB PHASE II EXERCISE from 09/16/2017 in [place]  Date  09/02/17  Educator  DJ  Instruction Review Code  2- Demonstrated Understanding      Nutrition I: Fats -Discuss the types of cholesterol, what cholesterol does to the heart, and how cholesterol levels can be controlled.   CARDIAC REHAB PHASE II EXERCISE from 09/16/2017 in [place]  Date   09/16/17  Educator  DJ  Instruction Review Code  2- Demonstrated Understanding      Nutrition II: Labels -Discuss the different components of food labels and how to read food label   CARDIAC REHAB PHASE II EXERCISE from 09/16/2017 in [place]  Date  09/09/17  Educator  DC  Instruction Review Code  2- Demonstrated Understanding      Heart Parts/Heart Disease and PAD -Discuss the anatomy of the heart, the pathway of blood circulation through the  heart, and these are affected by heart disease.   Stress I: Signs and Symptoms -Discuss the causes of stress, how stress may lead to anxiety and depression, and ways to limit stress.   Stress II: Relaxation -Discuss different types of relaxation techniques to limit stress.   Warning Signs of Stroke / TIA -Discuss definition of a stroke, what the signs and symptoms are of a stroke, and how to identify when someone is having stroke.   Knowledge Questionnaire Score: Knowledge Questionnaire Score - 07/24/17 1118      Knowledge Questionnaire Score   Pre Score  20/24       Core Components/Risk Factors/Patient Goals at Admission: Personal Goals and Risk Factors at Admission - 07/24/17 1127      Core Components/Risk Factors/Patient Goals on Admission    Weight Management  Weight Maintenance    Personal Goal Other  Yes    Personal Goal  Get back to normal ADL's, have less back pain and to be more mobile    Intervention  Attend CR 3 x week and supplement with home exercise 2 x week.     Expected Outcomes  Achieve personal goals       Core Components/Risk Factors/Patient Goals Review:  Goals and Risk Factor Review    Row Name 08/27/17 1550 09/23/17 1249           Core Components/Risk Factors/Patient Goals Review   Personal Goals Review  Weight Management/Obesity;Diabetes Get back to normal ADL's; have less back pain; be mobile.   Weight Management/Obesity;Diabetes Get back to normal ADL's;  have less back  pain; be more mobile.       Review  Patient has completed 14 sessions gaining one lb since last 30 day review. He says his weight fluctuates and he has had some recent medication adjustments in his diuretic medication. He is doing well in the program with progression. He says his back pain remains the same. He says he feels like his strength has increased in his upper body and he has recently done some gardening with improved stamina. He feels like he has more energy. His last A1C was 06/01/17 at 9.1. He reported fasting glucose readings have been averagin 125. Will continue to monitor for progress.   Patient has completed 26 sessions maintaining his weight. He continues to do well in the program with progression. His back and knee pain remain the same. He saw an orthopedic MD yesterday and he injected his knee with Cortisone with improvment in his pain. He says his mobility has improved and he feels stronger and has better stamina. Will continue to monitor for progress.       Expected Outcomes  Patient will continue to attend sessions and will complete the program and meet his personal goals.   Patient will continue to attend sessions and will complete the program and meet his personal goals.          Core Components/Risk Factors/Patient Goals at Discharge (Final Review):  Goals and Risk Factor Review - 09/23/17 1249      Core Components/Risk Factors/Patient Goals Review   Personal Goals Review  Weight Management/Obesity;Diabetes Get back to normal ADL's;  have less back pain; be more mobile.     Review  Patient has completed 26 sessions maintaining his weight. He continues to do well in the program with progression. His back and knee pain remain the same. He saw an orthopedic MD yesterday and he injected his knee with Cortisone with improvment in his  pain. He says his mobility has improved and he feels stronger and has better stamina. Will continue to monitor for progress.     Expected Outcomes  Patient  will continue to attend sessions and will complete the program and meet his personal goals.        ITP Comments: ITP Comments    Row Name 07/24/17 1120 08/03/17 0755         ITP Comments  Mr. Attig is a pleasant 84 year old gentleman. He has had NSTEMI with stent placment. He also c/o have (L) knee pain, and back pain. he states that he still has a heart blockage that his cardilogist is watching. His wife is very supportive and patient is eager to get started.   Patient new to program. He has completed 4 sessions. Will continue to monitor for progress.          Comments: ITP 30 Day REVIEW Patient is doing well in the program. Will continue to monitor for progress.

## 2017-09-23 NOTE — Progress Notes (Signed)
Daily Session Note  Patient Details  Name: Erik Logan MRN: [id number] Date of Birth: [birth year] Referring Provider:     CARDIAC REHAB PHASE II ORIENTATION from 07/24/2017 in [place]  Referring Provider  Dr. Stanford Breed      Encounter Date: 09/23/2017  Check In: Session Check In - 09/23/17 0930      Check-In   Location  [facility]    Staff Present  Aundra Dubin, RN, BSN;Gregory Luther Parody, BS, EP, Exercise Physiologist;Diane Coad, MS, EP, [facility], Exercise Physiologist    Supervising physician immediately available to respond to emergencies  See telemetry face sheet for immediately available MD    Medication changes reported      No    Fall or balance concerns reported     No    Warm-up and Cool-down  Performed as group-led instruction    Resistance Training Performed  Yes    VAD Patient?  No    PAD/SET Patient?  No      Pain Assessment   Currently in Pain?  No/denies    Pain Score  0-No pain    Multiple Pain Sites  No       Capillary Blood Glucose: No results found for this or any previous visit (from the past 24 hour(s)).    Social History   Tobacco Use  Smoking Status Former Smoker  . Packs/day: 1.00  . Years: 32.00  . Pack years: 32.00  . Types: Cigarettes  Smokeless Tobacco Never Used  Tobacco Comment   Quit around age 76    Goals Met:  Independence with exercise equipment Exercise tolerated well No report of cardiac concerns or symptoms Strength training completed today  Goals Unmet:  Not Applicable  Comments: Check out 1030.   Dr. Kate Sable is Medical Director for [facility] Cardiac and Pulmonary Rehab.

## 2017-09-25 ENCOUNTER — Encounter (HOSPITAL_COMMUNITY)
Admission: RE | Admit: 2017-09-25 | Discharge: 2017-09-25 | Disposition: A | Discharge status: Home / Self Care | Payer: Medicare Other | Source: Ambulatory Visit | Attending: Cardiology | Admitting: Cardiology

## 2017-09-25 DIAGNOSIS — Z955 Presence of coronary angioplasty implant and graft: Secondary | ICD-10-CM | POA: Diagnosis not present

## 2017-09-25 DIAGNOSIS — Z48812 Encounter for surgical aftercare following surgery on the circulatory system: Secondary | ICD-10-CM | POA: Diagnosis not present

## 2017-09-25 DIAGNOSIS — I214 Non-ST elevation (NSTEMI) myocardial infarction: Secondary | ICD-10-CM | POA: Diagnosis not present

## 2017-09-25 NOTE — Progress Notes (Signed)
Daily Session Note  Patient Details  Name: Erik Logan MRN: [id number] Date of Birth: 08-17-1928 Referring Provider:     CARDIAC REHAB PHASE II ORIENTATION from 07/24/2017 in [place]  Referring Provider  Dr. Stanford Breed      Encounter Date: 09/25/2017  Check In: Session Check In - 09/25/17 0815      Check-In   Location  [facility]    Staff Present  Aundra Dubin, RN, BSN;Diane Coad, MS, EP, [facility], Exercise Physiologist    Supervising physician immediately available to respond to emergencies  See telemetry face sheet for immediately available MD    Medication changes reported      No    Fall or balance concerns reported     No    Warm-up and Cool-down  Performed as group-led instruction    Resistance Training Performed  Yes    VAD Patient?  No    PAD/SET Patient?  No      Pain Assessment   Currently in Pain?  No/denies    Pain Score  0-No pain    Multiple Pain Sites  No       Capillary Blood Glucose: No results found for this or any previous visit (from the past 24 hour(s)).    Social History   Tobacco Use  Smoking Status Former Smoker  . Packs/day: 1.00  . Years: 32.00  . Pack years: 32.00  . Types: Cigarettes  Smokeless Tobacco Never Used  Tobacco Comment   Quit around age 27    Goals Met:  Independence with exercise equipment Exercise tolerated well No report of cardiac concerns or symptoms Strength training completed today  Goals Unmet:  Not Applicable  Comments: Check otu 915.   Dr. Kate Sable is Medical Director for [facility] Cardiac and Pulmonary Rehab.

## 2017-09-28 ENCOUNTER — Encounter (HOSPITAL_COMMUNITY)
Admission: RE | Admit: 2017-09-28 | Discharge: 2017-09-28 | Disposition: A | Discharge status: Home / Self Care | Payer: Medicare Other | Source: Ambulatory Visit | Attending: Cardiology | Admitting: Cardiology

## 2017-09-28 ENCOUNTER — Ambulatory Visit: Payer: 59 | Admitting: Cardiology

## 2017-09-28 DIAGNOSIS — Z48812 Encounter for surgical aftercare following surgery on the circulatory system: Secondary | ICD-10-CM | POA: Diagnosis not present

## 2017-09-28 DIAGNOSIS — Z955 Presence of coronary angioplasty implant and graft: Secondary | ICD-10-CM | POA: Diagnosis not present

## 2017-09-28 DIAGNOSIS — I214 Non-ST elevation (NSTEMI) myocardial infarction: Secondary | ICD-10-CM | POA: Diagnosis not present

## 2017-09-28 NOTE — Progress Notes (Signed)
Daily Session Note  Patient Details  Name: Erik Logan MRN: [id number] Date of Birth: 1929-03-09 Referring Provider:     CARDIAC REHAB PHASE II ORIENTATION from 07/24/2017 in [place]  Referring Provider  Dr. Stanford Breed      Encounter Date: 09/28/2017  Check In: Session Check In - 09/28/17 0930      Check-In   Location  [facility]    Staff Present  Russella Dar, MS, EP, [facility], Exercise Physiologist;Aerilyn Slee Wynetta Emery, RN, BSN    Supervising physician immediately available to respond to emergencies  See telemetry face sheet for immediately available MD    Medication changes reported      No    Fall or balance concerns reported     No    Warm-up and Cool-down  Performed as group-led instruction    Resistance Training Performed  Yes    VAD Patient?  No    PAD/SET Patient?  No      Pain Assessment   Currently in Pain?  No/denies    Pain Score  0-No pain    Multiple Pain Sites  No       Capillary Blood Glucose: No results found for this or any previous visit (from the past 24 hour(s)).    Social History   Tobacco Use  Smoking Status Former Smoker  . Packs/day: 1.00  . Years: 32.00  . Pack years: 32.00  . Types: Cigarettes  Smokeless Tobacco Never Used  Tobacco Comment   Quit around age 25    Goals Met:  Independence with exercise equipment Exercise tolerated well No report of cardiac concerns or symptoms Strength training completed today  Goals Unmet:  Not Applicable  Comments: Check out 1030.   Dr. Kate Sable is Medical Director for [facility] Cardiac and Pulmonary Rehab.

## 2017-09-30 ENCOUNTER — Encounter (HOSPITAL_COMMUNITY)
Admission: RE | Admit: 2017-09-30 | Discharge: 2017-09-30 | Disposition: A | Discharge status: Home / Self Care | Payer: Medicare Other | Source: Ambulatory Visit | Attending: Cardiology | Admitting: Cardiology

## 2017-09-30 DIAGNOSIS — I214 Non-ST elevation (NSTEMI) myocardial infarction: Secondary | ICD-10-CM | POA: Diagnosis not present

## 2017-09-30 DIAGNOSIS — Z955 Presence of coronary angioplasty implant and graft: Secondary | ICD-10-CM | POA: Diagnosis not present

## 2017-09-30 DIAGNOSIS — Z48812 Encounter for surgical aftercare following surgery on the circulatory system: Secondary | ICD-10-CM | POA: Diagnosis not present

## 2017-09-30 NOTE — Progress Notes (Signed)
Daily Session Note  Patient Details  Name: Erik Logan MRN: [id number] Date of Birth: [birth year] Referring Provider:     CARDIAC REHAB PHASE II ORIENTATION from 07/24/2017 in [place]  Referring Provider  Dr. Stanford Breed      Encounter Date: 09/30/2017  Check In: Session Check In - 09/30/17 0815      Check-In   Location  [facility]    Staff Present  Russella Dar, MS, EP, [facility], Exercise Physiologist;Debra Wynetta Emery, RN, BSN    Supervising physician immediately available to respond to emergencies  See telemetry face sheet for immediately available MD    Medication changes reported      No    Fall or balance concerns reported     No    Tobacco Cessation  No Change    Warm-up and Cool-down  Performed as group-led instruction    Resistance Training Performed  Yes    VAD Patient?  No    PAD/SET Patient?  No      Pain Assessment   Currently in Pain?  No/denies    Pain Score  0-No pain    Multiple Pain Sites  No       Capillary Blood Glucose: No results found for this or any previous visit (from the past 24 hour(s)).    Social History   Tobacco Use  Smoking Status Former Smoker  . Packs/day: 1.00  . Years: 32.00  . Pack years: 32.00  . Types: Cigarettes  Smokeless Tobacco Never Used  Tobacco Comment   Quit around age 36    Goals Met:  Independence with exercise equipment Exercise tolerated well Personal goals reviewed No report of cardiac concerns or symptoms Strength training completed today  Goals Unmet:  Not Applicable  Comments: Check out: 0915   Dr. Kate Sable is Medical Director for [place] and Pulmonary Rehab.

## 2017-10-02 ENCOUNTER — Encounter (HOSPITAL_COMMUNITY)
Admission: RE | Admit: 2017-10-02 | Discharge: 2017-10-02 | Disposition: A | Discharge status: Home / Self Care | Payer: Medicare Other | Source: Ambulatory Visit | Attending: Cardiology | Admitting: Cardiology

## 2017-10-02 DIAGNOSIS — Z955 Presence of coronary angioplasty implant and graft: Secondary | ICD-10-CM | POA: Diagnosis not present

## 2017-10-02 DIAGNOSIS — I214 Non-ST elevation (NSTEMI) myocardial infarction: Secondary | ICD-10-CM | POA: Diagnosis not present

## 2017-10-02 DIAGNOSIS — Z48812 Encounter for surgical aftercare following surgery on the circulatory system: Secondary | ICD-10-CM | POA: Diagnosis not present

## 2017-10-02 NOTE — Progress Notes (Signed)
Daily Session Note  Patient Details  Name: Erik Logan MRN: [id number] Date of Birth: 01/26/1929 Referring Provider:     CARDIAC REHAB PHASE II ORIENTATION from 07/24/2017 in [place] CARDIAC REHABILITATION  Referring Provider  Dr. Crenshaw      Encounter Date: 10/02/2017  Check In: Session Check In - 10/02/17 0930      Check-In   Location  [facility]    Staff Present  Diane Coad, MS, EP, [facility], Exercise Physiologist;Debra Johnson, RN, BSN    Supervising physician immediately available to respond to emergencies  See telemetry face sheet for immediately available MD    Medication changes reported      No    Fall or balance concerns reported     No    Tobacco Cessation  No Change    Warm-up and Cool-down  Performed as group-led instruction    Resistance Training Performed  Yes    VAD Patient?  No    PAD/SET Patient?  No      Pain Assessment   Currently in Pain?  No/denies    Pain Score  0-No pain    Multiple Pain Sites  No       Capillary Blood Glucose: No results found for this or any previous visit (from the past 24 hour(s)).    Social History   Tobacco Use  Smoking Status Former Smoker  . Packs/day: 1.00  . Years: 32.00  . Pack years: 32.00  . Types: Cigarettes  Smokeless Tobacco Never Used  Tobacco Comment   Quit around age 52    Goals Met:  Independence with exercise equipment Exercise tolerated well No report of cardiac concerns or symptoms Strength training completed today  Goals Unmet:  Not Applicable  Comments: 1030   Dr. Suresh Koneswaran is Medical Director for [place] Cardiac and Pulmonary Rehab. 

## 2017-10-05 ENCOUNTER — Encounter

## 2017-10-05 ENCOUNTER — Encounter (HOSPITAL_COMMUNITY)
Admission: RE | Admit: 2017-10-05 | Discharge: 2017-10-05 | Disposition: A | Discharge status: Home / Self Care | Payer: Medicare Other | Source: Ambulatory Visit | Attending: Cardiology | Admitting: Cardiology

## 2017-10-05 DIAGNOSIS — I214 Non-ST elevation (NSTEMI) myocardial infarction: Secondary | ICD-10-CM

## 2017-10-05 DIAGNOSIS — Z955 Presence of coronary angioplasty implant and graft: Secondary | ICD-10-CM | POA: Diagnosis not present

## 2017-10-05 DIAGNOSIS — Z48812 Encounter for surgical aftercare following surgery on the circulatory system: Secondary | ICD-10-CM | POA: Diagnosis not present

## 2017-10-05 NOTE — Progress Notes (Signed)
Daily Session Note  Patient Details  Name: FRANCIS DOENGES MRN: [id number] Date of Birth: 09/08/1928 Referring Provider:     CARDIAC REHAB PHASE II ORIENTATION from 07/24/2017 in [place]  Referring Provider  Dr. Stanford Breed      Encounter Date: 10/05/2017  Check In: Session Check In - 10/05/17 0930      Check-In   Location  [facility]    Staff Present  Russella Dar, MS, EP, [facility], Exercise Physiologist;Kalieb Freeland Wynetta Emery, RN, BSN    Supervising physician immediately available to respond to emergencies  See telemetry face sheet for immediately available MD    Medication changes reported      No    Fall or balance concerns reported     No    Warm-up and Cool-down  Performed as group-led instruction    Resistance Training Performed  Yes    VAD Patient?  No      Pain Assessment   Currently in Pain?  No/denies    Pain Score  0-No pain    Multiple Pain Sites  No       Capillary Blood Glucose: No results found for this or any previous visit (from the past 24 hour(s)).    Social History   Tobacco Use  Smoking Status Former Smoker  . Packs/day: 1.00  . Years: 32.00  . Pack years: 32.00  . Types: Cigarettes  Smokeless Tobacco Never Used  Tobacco Comment   Quit around age 21    Goals Met:  Independence with exercise equipment Exercise tolerated well No report of cardiac concerns or symptoms Strength training completed today  Goals Unmet:  Not Applicable  Comments: Check out 1030.   Dr. Kate Sable is Medical Director for [facility] Cardiac and Pulmonary Rehab.

## 2017-10-07 ENCOUNTER — Encounter (HOSPITAL_COMMUNITY)
Admission: RE | Admit: 2017-10-07 | Discharge: 2017-10-07 | Disposition: A | Discharge status: Home / Self Care | Payer: Medicare Other | Source: Ambulatory Visit | Attending: Cardiology | Admitting: Cardiology

## 2017-10-07 DIAGNOSIS — M545 Low back pain: Secondary | ICD-10-CM | POA: Diagnosis not present

## 2017-10-07 DIAGNOSIS — Z955 Presence of coronary angioplasty implant and graft: Secondary | ICD-10-CM | POA: Diagnosis not present

## 2017-10-07 DIAGNOSIS — Z1389 Encounter for screening for other disorder: Secondary | ICD-10-CM | POA: Diagnosis not present

## 2017-10-07 DIAGNOSIS — Z0000 Encounter for general adult medical examination without abnormal findings: Secondary | ICD-10-CM | POA: Diagnosis not present

## 2017-10-07 DIAGNOSIS — I214 Non-ST elevation (NSTEMI) myocardial infarction: Secondary | ICD-10-CM | POA: Diagnosis not present

## 2017-10-07 DIAGNOSIS — Z48812 Encounter for surgical aftercare following surgery on the circulatory system: Secondary | ICD-10-CM | POA: Diagnosis not present

## 2017-10-07 DIAGNOSIS — I10 Essential (primary) hypertension: Secondary | ICD-10-CM | POA: Diagnosis not present

## 2017-10-07 DIAGNOSIS — E1165 Type 2 diabetes mellitus with hyperglycemia: Secondary | ICD-10-CM | POA: Diagnosis not present

## 2017-10-07 NOTE — Progress Notes (Signed)
Daily Session Note  Patient Details  Name: Erik Logan MRN: [id number] Date of Birth: [birth year] Referring Provider:     CARDIAC REHAB PHASE II ORIENTATION from 07/24/2017 in [place]  Referring Provider  Dr. Stanford Breed      Encounter Date: 10/07/2017  Check In: Session Check In - 10/07/17 0930      Check-In   Location  [facility]    Staff Present  Russella Dar, MS, EP, [facility], Exercise Physiologist;Nichols Corter Wynetta Emery, RN, BSN    Supervising physician immediately available to respond to emergencies  See telemetry face sheet for immediately available MD    Medication changes reported      No    Fall or balance concerns reported     No    Warm-up and Cool-down  Performed as group-led instruction    Resistance Training Performed  Yes    VAD Patient?  No    PAD/SET Patient?  No      Pain Assessment   Currently in Pain?  No/denies    Pain Score  0-No pain    Multiple Pain Sites  No       Capillary Blood Glucose: No results found for this or any previous visit (from the past 24 hour(s)).    Social History   Tobacco Use  Smoking Status Former Smoker  . Packs/day: 1.00  . Years: 32.00  . Pack years: 32.00  . Types: Cigarettes  Smokeless Tobacco Never Used  Tobacco Comment   Quit around age 66    Goals Met:  Independence with exercise equipment Exercise tolerated well No report of cardiac concerns or symptoms Strength training completed today  Goals Unmet:  Not Applicable  Comments: Check out 1030.   Dr. Kate Sable is Medical Director for [facility] Cardiac and Pulmonary Rehab.

## 2017-10-09 ENCOUNTER — Encounter (HOSPITAL_COMMUNITY)
Admission: RE | Admit: 2017-10-09 | Discharge: 2017-10-09 | Disposition: A | Discharge status: Home / Self Care | Payer: Medicare Other | Source: Ambulatory Visit | Attending: Cardiology | Admitting: Cardiology

## 2017-10-09 DIAGNOSIS — Z955 Presence of coronary angioplasty implant and graft: Secondary | ICD-10-CM | POA: Diagnosis not present

## 2017-10-09 DIAGNOSIS — Z48812 Encounter for surgical aftercare following surgery on the circulatory system: Secondary | ICD-10-CM | POA: Diagnosis not present

## 2017-10-09 DIAGNOSIS — I214 Non-ST elevation (NSTEMI) myocardial infarction: Secondary | ICD-10-CM | POA: Diagnosis not present

## 2017-10-09 NOTE — Progress Notes (Signed)
Daily Session Note  Patient Details  Name: Erik Logan MRN: [id number] Date of Birth: 1928/06/28 Referring Provider:     CARDIAC REHAB PHASE II ORIENTATION from 07/24/2017 in [place]  Referring Provider  Dr. Stanford Breed      Encounter Date: 10/09/2017  Check In: Session Check In - 10/09/17 0930      Check-In   Supervising physician immediately available to respond to emergencies  See telemetry face sheet for immediately available MD    Location  [facility]    Staff Present  Russella Dar, MS, EP, [facility], Exercise Physiologist;Kamee Bobst Wynetta Emery, RN, BSN    Medication changes reported      No    Fall or balance concerns reported     No    Warm-up and Cool-down  Performed as group-led instruction    Resistance Training Performed  Yes    VAD Patient?  No    PAD/SET Patient?  No      Pain Assessment   Currently in Pain?  No/denies    Pain Score  0-No pain    Multiple Pain Sites  No       Capillary Blood Glucose: No results found for this or any previous visit (from the past 24 hour(s)).    Social History   Tobacco Use  Smoking Status Former Smoker  . Packs/day: 1.00  . Years: 32.00  . Pack years: 32.00  . Types: Cigarettes  Smokeless Tobacco Never Used  Tobacco Comment   Quit around age 55    Goals Met:  Independence with exercise equipment Exercise tolerated well No report of cardiac concerns or symptoms Strength training completed today  Goals Unmet:  Not Applicable  Comments: Check out 1030   Dr. Kate Sable is Medical Director for [place] and Pulmonary Rehab.

## 2017-10-12 ENCOUNTER — Encounter (HOSPITAL_COMMUNITY)
Admission: RE | Admit: 2017-10-12 | Discharge: 2017-10-12 | Disposition: A | Discharge status: Home / Self Care | Payer: Medicare Other | Source: Ambulatory Visit | Attending: Cardiology | Admitting: Cardiology

## 2017-10-12 DIAGNOSIS — Z955 Presence of coronary angioplasty implant and graft: Secondary | ICD-10-CM | POA: Diagnosis not present

## 2017-10-12 DIAGNOSIS — Z48812 Encounter for surgical aftercare following surgery on the circulatory system: Secondary | ICD-10-CM | POA: Diagnosis not present

## 2017-10-12 DIAGNOSIS — I214 Non-ST elevation (NSTEMI) myocardial infarction: Secondary | ICD-10-CM | POA: Diagnosis not present

## 2017-10-12 NOTE — Progress Notes (Signed)
Daily Session Note  Patient Details  Name: Erik Logan MRN: [id number] Date of Birth: 06/07/1928 Referring Provider:     CARDIAC REHAB PHASE II ORIENTATION from 07/24/2017 in [place] CARDIAC REHABILITATION  Referring Provider  Dr. Crenshaw      Encounter Date: 10/12/2017  Check In: Session Check In - 10/12/17 0930      Check-In   Supervising physician immediately available to respond to emergencies  See telemetry face sheet for immediately available MD    Location  [facility]    Staff Present  Diane Coad, MS, EP, [facility], Exercise Physiologist    Medication changes reported      No    Fall or balance concerns reported     No    Tobacco Cessation  No Change    Warm-up and Cool-down  Performed as group-led instruction    Resistance Training Performed  Yes    VAD Patient?  No    PAD/SET Patient?  No      Pain Assessment   Currently in Pain?  No/denies    Pain Score  0-No pain    Multiple Pain Sites  No       Capillary Blood Glucose: No results found for this or any previous visit (from the past 24 hour(s)).    Social History   Tobacco Use  Smoking Status Former Smoker  . Packs/day: 1.00  . Years: 32.00  . Pack years: 32.00  . Types: Cigarettes  Smokeless Tobacco Never Used  Tobacco Comment   Quit around age 52    Goals Met:  Independence with exercise equipment Exercise tolerated well Personal goals reviewed No report of cardiac concerns or symptoms Strength training completed today  Goals Unmet:  Not Applicable  Comments: Check out 1030   Dr. Suresh Koneswaran is Medical Director for [place] Cardiac and Pulmonary Rehab. 

## 2017-10-14 ENCOUNTER — Encounter (HOSPITAL_COMMUNITY): Payer: Medicare Other

## 2017-10-16 ENCOUNTER — Encounter (HOSPITAL_COMMUNITY): Payer: Medicare Other

## 2017-10-19 ENCOUNTER — Encounter (HOSPITAL_COMMUNITY): Payer: Medicare Other

## 2017-10-21 ENCOUNTER — Encounter (HOSPITAL_COMMUNITY)
Admission: RE | Admit: 2017-10-21 | Discharge: 2017-10-21 | Disposition: A | Discharge status: Home / Self Care | Payer: Medicare Other | Source: Ambulatory Visit | Attending: Cardiology | Admitting: Cardiology

## 2017-10-21 VITALS — Ht 67.0 in | Wt 189.0 lb

## 2017-10-21 DIAGNOSIS — I214 Non-ST elevation (NSTEMI) myocardial infarction: Secondary | ICD-10-CM | POA: Diagnosis not present

## 2017-10-21 DIAGNOSIS — Z48812 Encounter for surgical aftercare following surgery on the circulatory system: Secondary | ICD-10-CM | POA: Diagnosis not present

## 2017-10-21 DIAGNOSIS — Z955 Presence of coronary angioplasty implant and graft: Secondary | ICD-10-CM | POA: Diagnosis not present

## 2017-10-21 NOTE — Progress Notes (Signed)
Daily Session Note  Patient Details  Name: Erik Logan MRN: [id number] Date of Birth: [birth year] Referring Provider:     CARDIAC REHAB PHASE II ORIENTATION from 07/24/2017 in [place]  Referring Provider  Dr. Stanford Breed      Encounter Date: 10/21/2017  Check In: Session Check In - 10/21/17 1010      Check-In   Supervising physician immediately available to respond to emergencies  See telemetry face sheet for immediately available MD    Location  [facility]    Staff Present  Russella Dar, MS, EP, [facility], Exercise Physiologist;Other    Medication changes reported      No    Fall or balance concerns reported     No    Tobacco Cessation  No Change    Warm-up and Cool-down  Performed as group-led instruction    Resistance Training Performed  Yes    VAD Patient?  No    PAD/SET Patient?  No      Pain Assessment   Currently in Pain?  No/denies    Pain Score  0-No pain    Multiple Pain Sites  No       Capillary Blood Glucose: No results found for this or any previous visit (from the past 24 hour(s)).    Social History   Tobacco Use  Smoking Status Former Smoker  . Packs/day: 1.00  . Years: 32.00  . Pack years: 32.00  . Types: Cigarettes  Smokeless Tobacco Never Used  Tobacco Comment   Quit around age 29    Goals Met:  Independence with exercise equipment Exercise tolerated well Personal goals reviewed No report of cardiac concerns or symptoms Strength training completed today  Goals Unmet:  Not Applicable  Comments: Pt able to follow exercise prescription today without complaint.  Will continue to monitor for progression. Checked out at 10:30.     Dr. Kate Sable is Medical Director for [facility] Cardiac and Pulmonary Rehab.

## 2017-10-23 ENCOUNTER — Encounter (HOSPITAL_COMMUNITY): Payer: Medicare Other

## 2017-10-23 NOTE — Progress Notes (Signed)
Cardiac Individual Treatment Plan  Patient Details  Name: Erik Logan MRN: [id number] Date of Birth: [birth year] Referring Provider:     CARDIAC REHAB PHASE II ORIENTATION from 07/24/2017 in [place]  Referring Provider  Dr. Stanford Breed      Initial Encounter Date:    CARDIAC REHAB PHASE II ORIENTATION from 07/24/2017 in [place]  Date  07/24/17      Visit Diagnosis: Status post coronary artery stent placement  NSTEMI (non-ST elevated myocardial infarction) ([place])  Patient's Home Medications on Admission:  Current Outpatient Medications:  .  amLODipine (NORVASC) 5 MG tablet, Take 1 tablet (5 mg total) by mouth daily., Disp: 90 tablet, Rfl: 1 .  aspirin EC 81 MG tablet, Take 1 tablet (81 mg total) by mouth daily., Disp: , Rfl:  .  atorvastatin (LIPITOR) 80 MG tablet, Take 1 tablet (80 mg total) by mouth daily at 6 PM., Disp: 30 tablet, Rfl: 6 .  clopidogrel (PLAVIX) 75 MG tablet, Take 75 mg by mouth daily., Disp: , Rfl:  .  furosemide (LASIX) 40 MG tablet, TAKE 20 MG DAILY MAY TAKE 40 MG AS NEEDED FOR SWELLING, Disp: 135 tablet, Rfl: 1 .  insulin lispro (HUMALOG) 100 UNIT/ML injection, Inject 5 Units into the skin See admin instructions. Per Sliding Scale, Disp: , Rfl:  .  isosorbide mononitrate (IMDUR) 30 MG 24 hr tablet, Take 1 tablet (30 mg total) by mouth daily., Disp: 30 tablet, Rfl: 6 .  metoprolol tartrate (LOPRESSOR) 25 MG tablet, Take 0.5 tablets (12.5 mg total) by mouth daily., Disp: 30 tablet, Rfl: 6 .  nitroGLYCERIN (NITROSTAT) 0.4 MG SL tablet, Place 1 tablet (0.4 mg total) under the tongue every 5 (five) minutes x 3 doses as needed for chest pain., Disp: 25 tablet, Rfl: 12 .  Polyethyl Glycol-Propyl Glycol (SYSTANE OP), Apply 2 drops to eye daily as needed (Dry eyes)., Disp: , Rfl:  .  TRESIBA FLEXTOUCH 100 UNIT/ML SOPN FlexTouch Pen, Inject 25 Units into the skin every morning. , Disp: , Rfl:   Past Medical History: Past  Medical History:  Diagnosis Date  . Chronic renal insufficiency, stage 2 (mild)   . Coronary artery disease   . History of duodenal ulcer 1970s  . Hyperlipidemia   . Hypertension   . NSTEMI (non-ST elevated myocardial infarction) ([place]) 06/01/2017  . Type II diabetes mellitus ([facility])     Tobacco Use: Social History   Tobacco Use  Smoking Status Former Smoker  . Packs/day: 1.00  . Years: 32.00  . Pack years: 32.00  . Types: Cigarettes  Smokeless Tobacco Never Used  Tobacco Comment   Quit around age 56    Labs: Recent Review Flowsheet Data    Labs for ITP Cardiac and Pulmonary Rehab Latest Ref Rng & Units 06/01/2017 06/02/2017   Cholestrol 0 - 200 mg/dL - 171   LDLCALC 0 - 99 mg/dL - 116(H)   HDL >40 mg/dL - 34(L)   Trlycerides <150 mg/dL - 106   Hemoglobin A1c 4.8 - 5.6 % 9.1(H) -      Capillary Blood Glucose: Lab Results  Component Value Date   GLUCAP 239 (H) 06/03/2017   GLUCAP 152 (H) 06/03/2017   GLUCAP 151 (H) 06/02/2017   GLUCAP 156 (H) 06/02/2017   GLUCAP 131 (H) 06/02/2017     Exercise Target Goals: Exercise Program Goal: Individual exercise prescription set using results from initial 6 min walk test and THRR while considering  patient's activity barriers and  safety.   Exercise Prescription Goal: Starting with aerobic activity 30 plus minutes a day, 3 days per week for initial exercise prescription. Provide home exercise prescription and guidelines that participant acknowledges understanding prior to discharge.  Activity Barriers & Risk Stratification: Activity Barriers & Cardiac Risk Stratification - 07/24/17 1013      Activity Barriers & Cardiac Risk Stratification   Activity Barriers  Back Problems;Deconditioning;Other (comment)   Chronic (L) knee paing that flares up from time to time.   Comments  Lightheadedness during walk test. He states he does not experience being light headed at home.     Cardiac Risk Stratification  High       6 Minute  Walk: 6 Minute Walk    Row Name 07/24/17 1012         6 Minute Walk   Phase  Initial     Distance  1250 feet     Distance % Change  0 %     Distance Feet Change  0 ft     Walk Time  6 minutes     # of Rest Breaks  0     MPH  2.36     METS  2.81     RPE  13     Perceived Dyspnea   11     VO2 Peak  6.23     Symptoms  Yes (comment)     Comments  Lightheadedness     Resting HR  57 bpm     Resting BP  132/62     Resting Oxygen Saturation   100 %     Exercise Oxygen Saturation  during 6 min walk  89 %     Max Ex. HR  83 bpm     Max Ex. BP  148/68     2 Minute Post BP  136/64        Oxygen Initial Assessment:   Oxygen Re-Evaluation:   Oxygen Discharge (Final Oxygen Re-Evaluation):   Initial Exercise Prescription: Initial Exercise Prescription - 07/24/17 1000      Date of Initial Exercise RX and Referring Provider   Date  07/24/17    Referring Provider  Dr. Stanford Breed      Treadmill   MPH  1.2    Grade  0    Minutes  15    METs  1.9      NuStep   Level  0    SPM  72    Minutes  20    METs  1.5      Prescription Details   Frequency (times per week)  3    Duration  Progress to 30 minutes of continuous aerobic without signs/symptoms of physical distress      Intensity   THRR 40-80% of Max Heartrate  [phone]    Ratings of Perceived Exertion  11-13    Perceived Dyspnea  0-4      Progression   Progression  Continue progressive overload as per policy without signs/symptoms or physical distress.      Resistance Training   Training Prescription  Yes    Weight  1    Reps  10-15       Perform Capillary Blood Glucose checks as needed.  Exercise Prescription Changes:  Exercise Prescription Changes    Row Name 07/27/17 1200 07/30/17 1500 08/14/17 1400 09/01/17 0700 09/15/17 0700     Response to Exercise   Blood Pressure (Admit)  -  120/60  142/60  154/68  142/62   Blood Pressure (Exercise)  -  144/60  152/70  148/62  140/88   Blood Pressure (Exit)  -   130/50  128/68  150/88  140/70   Heart Rate (Admit)  -  62 bpm  60 bpm  53 bpm  56 bpm   Heart Rate (Exercise)  -  84 bpm  100 bpm  81 bpm  80 bpm   Heart Rate (Exit)  -  70 bpm  69 bpm  63 bpm  64 bpm   Rating of Perceived Exertion (Exercise)  -  11  11  10  11    Duration  -  Progress to 30 minutes of  aerobic without signs/symptoms of physical distress  Progress to 30 minutes of  aerobic without signs/symptoms of physical distress  Progress to 30 minutes of  aerobic without signs/symptoms of physical distress  Progress to 30 minutes of  aerobic without signs/symptoms of physical distress   Intensity  -  THRR New 91-105-120  THRR New 90-104-119  THRR New 85-102-118  THRR New 87-103-118     Progression   Progression  -  Continue to progress workloads to maintain intensity without signs/symptoms of physical distress.  Continue to progress workloads to maintain intensity without signs/symptoms of physical distress.  Continue to progress workloads to maintain intensity without signs/symptoms of physical distress.  Continue to progress workloads to maintain intensity without signs/symptoms of physical distress.     Resistance Training   Training Prescription  Yes  Yes  Yes  Yes  Yes   Weight  1  1  1  2  3    Reps  10-15  10-15  10-15  10-15  10-15     Treadmill   MPH  1.2  1.2  1.2  1.2  -   Grade  0  0  0  0  -   Minutes  15  15  15  15   -   METs  1.9  1.9  1.9  1.9  -     NuStep   Level  0  1  1  1  2    SPM  72  87  101  87  93   Minutes  20  20  20  20  20    METs  1.5  2.4  2.2  2.2  1.8     Arm Ergometer   Level  -  -  1.2  -  1.2   Watts  -  -  20  -  18   Minutes  -  -  15  -  15   METs  -  -  2.5  -  2.4     Home Exercise Plan   Plans to continue exercise at  Home (comment)  Home (comment)  Home (comment)  Home (comment)  Home (comment)   Frequency  Add 2 additional days to program exercise sessions.  Add 2 additional days to program exercise sessions.  Add 2 additional days to  program exercise sessions.  Add 2 additional days to program exercise sessions.  Add 2 additional days to program exercise sessions.   Initial Home Exercises Provided  07/27/17  07/27/17  07/27/17  07/27/17  07/27/17   Row Name 10/03/17 1400 10/19/17 0700           Response to Exercise   Blood Pressure (Admit)  140/62  120/60      Blood Pressure (Exercise)  142/74  158/60  Blood Pressure (Exit)  124/62  140/60      Heart Rate (Admit)  58 bpm  53 bpm      Heart Rate (Exercise)  83 bpm  70 bpm      Heart Rate (Exit)  67 bpm  62 bpm      Rating of Perceived Exertion (Exercise)  12  12      Duration  Progress to 30 minutes of  aerobic without signs/symptoms of physical distress  Progress to 30 minutes of  aerobic without signs/symptoms of physical distress      Intensity  THRR unchanged  THRR New 85-102-118        Progression   Progression  Continue to progress workloads to maintain intensity without signs/symptoms of physical distress.  Continue to progress workloads to maintain intensity without signs/symptoms of physical distress.      Average METs  2.55  2.5        Resistance Training   Training Prescription  Yes  Yes      Weight  3  4      Reps  10-15  10-15        Treadmill   MPH  - Had to stop doing TM due to knee and back pain. Switched to   -        NuStep   Level  2  2      SPM  106  82      Minutes  20  20      METs  2  1.9        Arm Ergometer   Level  1.8  2.3      Watts  28  28      Minutes  15  15      METs  3.1  3.1        Home Exercise Plan   Plans to continue exercise at  Home (comment)  Home (comment)      Frequency  Add 2 additional days to program exercise sessions.  Add 2 additional days to program exercise sessions.      Initial Home Exercises Provided  07/27/17  07/27/17         Exercise Comments:  Exercise Comments    Row Name 07/27/17 1242 07/30/17 1517 08/14/17 1440 09/01/17 0802 09/15/17 0738   Exercise Comments  Patient recieved the  take home exercise plan today. THr was addressed as were safety guidelines for being active when not in CR. Patient demonstrated an understanding and was encouraged to ask any future questions as they arise.   Patient has just started Cr and will be progressed in time in tune to his exercise goals  Patinet is doing well in CR. patient was taken ff of teh treadmill due to dizziness and light headed feeling and was placed on the arm ergometer. patient is doing very well on his two machines and has increased his SPMs on both since being on them   Patient is doing well in CR. Patient was moved back to treadmill due to lack of dizziness. Patient has been handling the change very well. Patient has also increased dumbbell size to 2lbs.   Patient has been doing well in CR. patient has increased the sumbbell size to 3lbs and has also increased his level on the nustep machine. Patient has been moved back to the arm ergometer due to knee and joint pain   Row Name 10/19/17 0710  Exercise Comments  Patient has progressed well. He has achieved his expected goals. Graduates in 2 more visits.           Exercise Goals and Review:  Exercise Goals    Row Name 07/24/17 1015             Exercise Goals   Increase Physical Activity  Yes       Intervention  Provide advice, education, support and counseling about physical activity/exercise needs.;Develop an individualized exercise prescription for aerobic and resistive training based on initial evaluation findings, risk stratification, comorbidities and participant's personal goals.       Expected Outcomes  Short Term: Attend rehab on a regular basis to increase amount of physical activity.       Increase Strength and Stamina  Yes       Intervention  Provide advice, education, support and counseling about physical activity/exercise needs.;Develop an individualized exercise prescription for aerobic and resistive training based on initial evaluation findings,  risk stratification, comorbidities and participant's personal goals.       Expected Outcomes  Short Term: Increase workloads from initial exercise prescription for resistance, speed, and METs.       Able to understand and use rate of perceived exertion (RPE) scale  Yes       Intervention  Provide education and explanation on how to use RPE scale       Expected Outcomes  Short Term: Able to use RPE daily in rehab to express subjective intensity level;Long Term:  Able to use RPE to guide intensity level when exercising independently       Able to understand and use Dyspnea scale  Yes       Intervention  Provide education and explanation on how to use Dyspnea scale       Expected Outcomes  Short Term: Able to use Dyspnea scale daily in rehab to express subjective sense of shortness of breath during exertion;Long Term: Able to use Dyspnea scale to guide intensity level when exercising independently       Knowledge and understanding of Target Heart Rate Range (THRR)  Yes       Intervention  Provide education and explanation of THRR including how the numbers were predicted and where they are located for reference       Expected Outcomes  Short Term: Able to state/look up THRR;Long Term: Able to use THRR to govern intensity when exercising independently;Short Term: Able to use daily as guideline for intensity in rehab       Able to check pulse independently  Yes       Intervention  Provide education and demonstration on how to check pulse in carotid and radial arteries.;Review the importance of being able to check your own pulse for safety during independent exercise       Expected Outcomes  Short Term: Able to explain why pulse checking is important during independent exercise;Long Term: Able to check pulse independently and accurately       Understanding of Exercise Prescription  Yes       Intervention  Provide education, explanation, and written materials on patient's individual exercise prescription        Expected Outcomes  Short Term: Able to explain program exercise prescription;Long Term: Able to explain home exercise prescription to exercise independently          Exercise Goals Re-Evaluation : Exercise Goals Re-Evaluation    Row Name 07/30/17 1517 08/25/17 2353 09/21/17 0740 10/19/17 6144  Exercise Goal Re-Evaluation   Exercise Goals Review  Increase Physical Activity;Increase Strength and Stamina;Able to check pulse independently;Able to understand and use Dyspnea scale;Able to understand and use rate of perceived exertion (RPE) scale;Knowledge and understanding of Target Heart Rate Range (THRR);Understanding of Exercise Prescription  Increase Physical Activity;Increase Strength and Stamina;Able to check pulse independently;Able to understand and use Dyspnea scale;Able to understand and use rate of perceived exertion (RPE) scale;Knowledge and understanding of Target Heart Rate Range (THRR);Understanding of Exercise Prescription  Increase Physical Activity;Increase Strength and Stamina;Able to check pulse independently;Able to understand and use Dyspnea scale;Able to understand and use rate of perceived exertion (RPE) scale;Knowledge and understanding of Target Heart Rate Range (THRR);Understanding of Exercise Prescription  Increase Strength and Stamina    Comments  Patient has just started Cr and will be progressed in time in tune to his exercise goals  Patient is doing very well in CR. Patient has tstaed to me that he is really enjoying the program and the exercises that he is doing here. He states that he is eating better and has gained more mobility from the exercise program.   Patient has been doing very well in CR. Patient has stated to me that he has gained more mobility since beginning his time here in CR. Patient states that he feels no back pain on the machines and that he has less and less dizziness.   Patient will graduate in 2 more visits. He says he has gained mobility and has  returned to his normal ADL's. he also says his back pain has improved with exercise. Still has pain in his knees when walking to much so we were not able to put him back on the TM. He as progressed at a steady rate on equipment and hand held weights.     Expected Outcomes  Patient wishes to gain less back pain and to gain more mobility and ADLs.  Patient wishes to gain less back pain and to gain more mobility and ADLs.  Patient wishes to gain less back pain and to gain more mobility and ADLs.  Has achieved his goals of gaining mobility, returning to ADL's. Has less back discomfort.         Discharge Exercise Prescription (Final Exercise Prescription Changes): Exercise Prescription Changes - 10/19/17 0700      Response to Exercise   Blood Pressure (Admit)  120/60    Blood Pressure (Exercise)  158/60    Blood Pressure (Exit)  140/60    Heart Rate (Admit)  53 bpm    Heart Rate (Exercise)  70 bpm    Heart Rate (Exit)  62 bpm    Rating of Perceived Exertion (Exercise)  12    Duration  Progress to 30 minutes of  aerobic without signs/symptoms of physical distress    Intensity  THRR New   85-102-118     Progression   Progression  Continue to progress workloads to maintain intensity without signs/symptoms of physical distress.    Average METs  2.5      Resistance Training   Training Prescription  Yes    Weight  4    Reps  10-15      NuStep   Level  2    SPM  82    Minutes  20    METs  1.9      Arm Ergometer   Level  2.3    Watts  28    Minutes  15    METs  3.1      Home Exercise Plan   Plans to continue exercise at  Home (comment)    Frequency  Add 2 additional days to program exercise sessions.    Initial Home Exercises Provided  07/27/17       Nutrition:  Target Goals: Understanding of nutrition guidelines, daily intake of sodium 1500mg , cholesterol 200mg , calories 30% from fat and 7% or less from saturated fats, daily to have 5 or more servings of fruits and  vegetables.  Biometrics: Pre Biometrics - 07/24/17 1015      Pre Biometrics   Height  5\' 7"  (1.702 m)    Weight  84.6 kg    Waist Circumference  19 inches    Hip Circumference  19 inches    Waist to Hip Ratio  1 %    BMI (Calculated)  29.22    Triceps Skinfold  10 mm    % Body Fat  26.7 %    Grip Strength  70.3 kg    Flexibility  0 in    Single Leg Stand  3 seconds        Nutrition Therapy Plan and Nutrition Goals: Nutrition Therapy & Goals - 08/20/17 1633      Personal Nutrition Goals   Nutrition Goal  For heart healthy choices add >50% of whole grains, make half their plate fruits and vegetables. Discuss the difference between starchy vegetables and leafy greens, and how leafy vegetables provide fiber, helps maintain healthy weight, helps control blood glucose, and lowers cholesterol.  Discuss purchasing fresh or frozen vegetable to reduce sodium and not to add grease, fat or sugar. Consume <18oz of red meat per week. Consume lean cuts of meats and very little of meats high in sodium and nitrates such as pork and lunch meats. Discussed portion control for all food groups.    Personal Goal #2  Patient has been eating a diabetic diet for years. He has added low sodium diet since recent heart event along with heart healthy diet.     Additional Goals?  No      Intervention Plan   Intervention  Nutrition handout(s) given to patient.    Expected Outcomes  Short Term Goal: Understand basic principles of dietary content, such as calories, fat, sodium, cholesterol and nutrients.       Nutrition Assessments: Nutrition Assessments - 10/23/17 1426      MEDFICTS Scores   Pre Score  68    Post Score  39    Score Difference  -29       Nutrition Goals Re-Evaluation: Nutrition Goals Re-Evaluation    [place] Name 08/27/17 1546 09/23/17 1249           Goals   Current Weight  190 lb 8 oz (86.4 kg)  189 lb 14.4 oz (86.1 kg)      Nutrition Goal  For heart healthy choices add >50% of  whole grains, make half their plate fruits and vegetables. Discuss the difference between starchy vegetables and leafy greens, and how leafy vegetables provide fiber, helps maintain healthy weight, helps control blood glucose, and lowers cholesterol.  Discuss purchasing fresh or frozen vegetable to reduce sodium and not to add grease, fat or sugar. Consume <18oz of red meat per week. Consume lean cuts of meats and very little of meats high in sodium and nitrates such as pork and lunch meats. Discussed portion control for all food groups.  For heart healthy choices add >50% of whole grains, make half  their plate fruits and vegetables. Discuss the difference between starchy vegetables and leafy greens, and how leafy vegetables provide fiber, helps maintain healthy weight, helps control blood glucose, and lowers cholesterol.  Discuss purchasing fresh or frozen vegetable to reduce sodium and not to add grease, fat or sugar. Consume <18oz of red meat per week. Consume lean cuts of meats and very little of meats high in sodium and nitrates such as pork and lunch meats. Discussed portion control for all food groups.      Comment  Patient has gained 2 lbs since last 30 day review. He has had some diuretic medication adjustments and says his weight fluctuates. He says he continues to follow a diabetic, low sodium diet.   Patient has lost 1 lb since last 30 day review. He continues to follow a diabetic, low sodium diet.       Expected Outcome  Patient will continue to meet his nutritional goals.   Patient will continue to meet his nutritional goals.          Nutrition Goals Discharge (Final Nutrition Goals Re-Evaluation): Nutrition Goals Re-Evaluation - 09/23/17 1249      Goals   Current Weight  189 lb 14.4 oz (86.1 kg)    Nutrition Goal  For heart healthy choices add >50% of whole grains, make half their plate fruits and vegetables. Discuss the difference between starchy vegetables and leafy greens, and how leafy  vegetables provide fiber, helps maintain healthy weight, helps control blood glucose, and lowers cholesterol.  Discuss purchasing fresh or frozen vegetable to reduce sodium and not to add grease, fat or sugar. Consume <18oz of red meat per week. Consume lean cuts of meats and very little of meats high in sodium and nitrates such as pork and lunch meats. Discussed portion control for all food groups.    Comment  Patient has lost 1 lb since last 30 day review. He continues to follow a diabetic, low sodium diet.     Expected Outcome  Patient will continue to meet his nutritional goals.        Psychosocial: Target Goals: Acknowledge presence or absence of significant depression and/or stress, maximize coping skills, provide positive support system. Participant is able to verbalize types and ability to use techniques and skills needed for reducing stress and depression.  Initial Review & Psychosocial Screening: Initial Psych Review & Screening - 07/24/17 1135      Initial Review   Current issues with  None Identified      Family Dynamics   Good Support System?  Yes      Barriers   Psychosocial barriers to participate in program  There are no identifiable barriers or psychosocial needs.      Screening Interventions   Interventions  Encouraged to exercise    Expected Outcomes  Short Term goal: Identification and review with participant of any Quality of Life or Depression concerns found by scoring the questionnaire.;Long Term goal: The participant improves quality of Life and PHQ9 Scores as seen by post scores and/or verbalization of changes       Quality of Life Scores: Quality of Life - 07/24/17 1016      Quality of Life Scores   Health/Function Pre  21.37 %    Socioeconomic Pre  25 %    Psych/Spiritual Pre  22.75 %    Family Pre  25.9 %    GLOBAL Pre  23.02 %      Scores of 19 and below usually indicate  a poorer quality of life in these areas.  A difference of  2-3 points is a  clinically meaningful difference.  A difference of 2-3 points in the total score of the Quality of Life Index has been associated with significant improvement in overall quality of life, self-image, physical symptoms, and general health in studies assessing change in quality of life.  PHQ-9: Recent Review Flowsheet Data    Depression screen [facility] 2/9 10/23/2017 07/24/2017   Decreased Interest 0 0   Down, Depressed, Hopeless 0 0   PHQ - 2 Score 0 0   Altered sleeping 0 0   Tired, decreased energy 1 1   Change in appetite 0 0   Feeling bad or failure about yourself  0 0   Trouble concentrating 0 0   Moving slowly or fidgety/restless 0 0   Suicidal thoughts 0 0   PHQ-9 Score 1 1   Difficult doing work/chores Not difficult at all Somewhat difficult     Interpretation of Total Score  Total Score Depression Severity:  1-4 = Minimal depression, 5-9 = Mild depression, 10-14 = Moderate depression, 15-19 = Moderately severe depression, 20-27 = Severe depression   Psychosocial Evaluation and Intervention: Psychosocial Evaluation - 07/24/17 1136      Psychosocial Evaluation & Interventions   Interventions  Encouraged to exercise with the program and follow exercise prescription    Continue Psychosocial Services   No Follow up required       Psychosocial Re-Evaluation: Psychosocial Re-Evaluation    Row Name 08/27/17 1554 09/23/17 1252           Psychosocial Re-Evaluation   Current issues with  None Identified  None Identified      Comments  Patient's initial QOL score was 23.02 and his PHQ-9 score was 1 with no psychosocial issues identified at his orientation visit.   Patient's initial QOL score was 23.02 and his PHQ-9 score was 1 with no psychosocial issues identified at his orientation visit.       Expected Outcomes  Patient will have no psychosocial issues identified at discharge.   Patient will have no psychosocial issues identified at discharge.       Interventions  Relaxation  education;Encouraged to attend Cardiac Rehabilitation for the exercise;Stress management education  Relaxation education;Encouraged to attend Cardiac Rehabilitation for the exercise;Stress management education      Continue Psychosocial Services   No Follow up required  No Follow up required         Psychosocial Discharge (Final Psychosocial Re-Evaluation): Psychosocial Re-Evaluation - 09/23/17 1252      Psychosocial Re-Evaluation   Current issues with  None Identified    Comments  Patient's initial QOL score was 23.02 and his PHQ-9 score was 1 with no psychosocial issues identified at his orientation visit.     Expected Outcomes  Patient will have no psychosocial issues identified at discharge.     Interventions  Relaxation education;Encouraged to attend Cardiac Rehabilitation for the exercise;Stress management education    Continue Psychosocial Services   No Follow up required       Vocational Rehabilitation: Provide vocational rehab assistance to qualifying candidates.   Vocational Rehab Evaluation & Intervention: Vocational Rehab - 07/24/17 1118      Initial Vocational Rehab Evaluation & Intervention   Assessment shows need for Vocational Rehabilitation  No       Education: Education Goals: Education classes will be provided on a weekly basis, covering required topics. Participant will state understanding/return demonstration of topics  presented.  Learning Barriers/Preferences: Learning Barriers/Preferences - 07/24/17 1117      Learning Barriers/Preferences   Learning Barriers  None    Learning Preferences  Written Material;Pictoral;Video       Education Topics: Hypertension, Hypertension Reduction -Define heart disease and high blood pressure. Discus how high blood pressure affects the body and ways to reduce high blood pressure.   CARDIAC REHAB PHASE II EXERCISE from 10/21/2017 in [place]  Date  10/21/17  Educator  DC  Instruction Review  Code  2- Demonstrated Understanding      Exercise and Your Heart -Discuss why it is important to exercise, the FITT principles of exercise, normal and abnormal responses to exercise, and how to exercise safely.   CARDIAC REHAB PHASE II EXERCISE from 10/21/2017 in [place]  Date  07/29/17  Educator  [place]  Instruction Review Code  2- Demonstrated Understanding      Angina -Discuss definition of angina, causes of angina, treatment of angina, and how to decrease risk of having angina.   CARDIAC REHAB PHASE II EXERCISE from 10/21/2017 in [place]  Date  08/05/17  Educator  DC  Instruction Review Code  2- Demonstrated Understanding      Cardiac Medications -Review what the following cardiac medications are used for, how they affect the body, and side effects that may occur when taking the medications.  Medications include Aspirin, Beta blockers, calcium channel blockers, ACE Inhibitors, angiotensin receptor blockers, diuretics, digoxin, and antihyperlipidemics.   CARDIAC REHAB PHASE II EXERCISE from 10/21/2017 in [place]  Date  08/12/17  Educator  DC  Instruction Review Code  2- Demonstrated Understanding      Congestive Heart Failure -Discuss the definition of CHF, how to live with CHF, the signs and symptoms of CHF, and how keep track of weight and sodium intake.   CARDIAC REHAB PHASE II EXERCISE from 10/21/2017 in [place]  Date  08/19/17  Educator  DC  Instruction Review Code  2- Demonstrated Understanding      Heart Disease and Intimacy -Discus the effect sexual activity has on the heart, how changes occur during intimacy as we age, and safety during sexual activity.   CARDIAC REHAB PHASE II EXERCISE from 10/21/2017 in [place]  Date  08/26/17  Educator  [employer]  Instruction Review Code  2- Demonstrated Understanding      Smoking Cessation / COPD -Discuss  different methods to quit smoking, the health benefits of quitting smoking, and the definition of COPD.   CARDIAC REHAB PHASE II EXERCISE from 10/21/2017 in [place]  Date  09/02/17  Educator  DJ  Instruction Review Code  2- Demonstrated Understanding      Nutrition I: Fats -Discuss the types of cholesterol, what cholesterol does to the heart, and how cholesterol levels can be controlled.   CARDIAC REHAB PHASE II EXERCISE from 10/21/2017 in [place]  Date  09/16/17  Educator  DJ  Instruction Review Code  2- Demonstrated Understanding      Nutrition II: Labels -Discuss the different components of food labels and how to read food label   CARDIAC REHAB PHASE II EXERCISE from 10/21/2017 in [place]  Date  09/09/17  Educator  DC  Instruction Review Code  2- Demonstrated Understanding      Heart Parts/Heart Disease and PAD -Discuss the anatomy of the heart, the pathway of blood circulation through the  heart, and these are affected by heart disease.   CARDIAC REHAB PHASE II EXERCISE from 10/21/2017 in [place]  Date  09/23/17  Educator  DC  Instruction Review Code  2- Demonstrated Understanding      Stress I: Signs and Symptoms -Discuss the causes of stress, how stress may lead to anxiety and depression, and ways to limit stress.   CARDIAC REHAB PHASE II EXERCISE from 10/21/2017 in [place]  Date  09/30/17  Educator  D. Coad  Instruction Review Code  2- Demonstrated Understanding      Stress II: Relaxation -Discuss different types of relaxation techniques to limit stress.   CARDIAC REHAB PHASE II EXERCISE from 10/21/2017 in [place]  Date  10/07/17  Educator  DC  Instruction Review Code  2- Demonstrated Understanding      Warning Signs of Stroke / TIA -Discuss definition of a stroke, what the signs and symptoms are of a stroke, and  how to identify when someone is having stroke.   Knowledge Questionnaire Score: Knowledge Questionnaire Score - 10/23/17 1426      Knowledge Questionnaire Score   Pre Score  20/24    Post Score  23/24       Core Components/Risk Factors/Patient Goals at Admission: Personal Goals and Risk Factors at Admission - 07/24/17 1127      Core Components/Risk Factors/Patient Goals on Admission    Weight Management  Weight Maintenance    Personal Goal Other  Yes    Personal Goal  Get back to normal ADL's, have less back pain and to be more mobile    Intervention  Attend CR 3 x week and supplement with home exercise 2 x week.     Expected Outcomes  Achieve personal goals       Core Components/Risk Factors/Patient Goals Review:  Goals and Risk Factor Review    Row Name 08/27/17 1550 09/23/17 1249           Core Components/Risk Factors/Patient Goals Review   Personal Goals Review  Weight Management/Obesity;Diabetes Get back to normal ADL's; have less back pain; be mobile.   Weight Management/Obesity;Diabetes Get back to normal ADL's;  have less back pain; be more mobile.       Review  Patient has completed 14 sessions gaining one lb since last 30 day review. He says his weight fluctuates and he has had some recent medication adjustments in his diuretic medication. He is doing well in the program with progression. He says his back pain remains the same. He says he feels like his strength has increased in his upper body and he has recently done some gardening with improved stamina. He feels like he has more energy. His last A1C was 06/01/17 at 9.1. He reported fasting glucose readings have been averagin 125. Will continue to monitor for progress.   Patient has completed 26 sessions maintaining his weight. He continues to do well in the program with progression. His back and knee pain remain the same. He saw an orthopedic MD yesterday and he injected his knee with Cortisone with improvment in his pain.  He says his mobility has improved and he feels stronger and has better stamina. Will continue to monitor for progress.       Expected Outcomes  Patient will continue to attend sessions and will complete the program and meet his personal goals.   Patient will continue to attend sessions and will complete the program and meet his  personal goals.          Core Components/Risk Factors/Patient Goals at Discharge (Final Review):  Goals and Risk Factor Review - 09/23/17 1249      Core Components/Risk Factors/Patient Goals Review   Personal Goals Review  Weight Management/Obesity;Diabetes   Get back to normal ADL's;  have less back pain; be more mobile.    Review  Patient has completed 26 sessions maintaining his weight. He continues to do well in the program with progression. His back and knee pain remain the same. He saw an orthopedic MD yesterday and he injected his knee with Cortisone with improvment in his pain. He says his mobility has improved and he feels stronger and has better stamina. Will continue to monitor for progress.     Expected Outcomes  Patient will continue to attend sessions and will complete the program and meet his personal goals.        ITP Comments: ITP Comments    Row Name 07/24/17 1120 08/03/17 0755 10/23/17 1428       ITP Comments  Mr. Hartis is a pleasant 84 year old gentleman. He has had NSTEMI with stent placment. He also c/o have (L) knee pain, and back pain. he states that he still has a heart blockage that his cardilogist is watching. His wife is very supportive and patient is eager to get started.   Patient new to program. He has completed 4 sessions. Will continue to monitor for progress.   Patient graduated 10/21/17 with 35 sessions with discharge documentation pending.         Comments: ITP 30 Day REVIEW Patient graduated 10/21/17 with 35 sessions with discharge documentation pending.

## 2017-10-26 ENCOUNTER — Encounter (HOSPITAL_COMMUNITY): Payer: Medicare Other

## 2017-10-26 ENCOUNTER — Encounter: Payer: Self-pay | Admitting: Cardiology

## 2017-10-26 ENCOUNTER — Ambulatory Visit (INDEPENDENT_AMBULATORY_CARE_PROVIDER_SITE_OTHER): Payer: Medicare Other | Admitting: Cardiology

## 2017-10-26 VITALS — BP 143/63 | HR 56 | Ht 68.5 in | Wt 191.4 lb

## 2017-10-26 DIAGNOSIS — R600 Localized edema: Secondary | ICD-10-CM | POA: Diagnosis not present

## 2017-10-26 DIAGNOSIS — I2510 Atherosclerotic heart disease of native coronary artery without angina pectoris: Secondary | ICD-10-CM

## 2017-10-26 DIAGNOSIS — N183 Chronic kidney disease, stage 3 unspecified: Secondary | ICD-10-CM

## 2017-10-26 DIAGNOSIS — I10 Essential (primary) hypertension: Secondary | ICD-10-CM

## 2017-10-26 NOTE — Patient Instructions (Addendum)
Medication Instructions:  Your physician recommends that you continue on your current medications as directed. Please refer to the Current Medication list given to you today.  Labwork: NONE  Testing/Procedures: NONE  Follow-Up: Your physician wants you to follow-up in: [place] DR. BRANCH. You will receive a reminder letter in the mail two months in advance. If you don't receive a letter, please call our office to schedule the follow-up appointment.  Any Other Special Instructions Will Be Listed Below (If Applicable).  If you need a refill on your cardiac medications before your next appointment, please call your pharmacy.

## 2017-10-26 NOTE — Progress Notes (Signed)
Clinical Summary Erik Logan is a 84 y.o.male seen today for follow up of the following medical problems.   1. Lightheadness/dizziness/ - participating in cardiac rehab, just finished - swelling has improved. Mild dizziness at times but much improved off hydralzine and after lowering norvasc.    2. CAD - admitted with NSTEMI 05/2017 - see cath report below. S/p PTCA of mid PDA, DES to distal RCA, DES to prox RCA with plans for medical therapy of residual disease 05/2017 echo LVEF 55-60%, no WMAs, grade I diastolic dysfunction  - due to ongoing SOB, we changed his brillinta to plavix last visit.  - no recent chest pain - compliant with meds  3. HTN - developed severe LE edema after starting hydralzine. Noted also around that time norvasc was increased from 5 to 10mg  daily.  - hydralazine was discontinued.   Home bps 140s/70. Mild dizziness at times.   .  4. Hyperlipidemia -compliant with statin.    5. CKD III - Cr stabel by last check.      SH: wife recent admission to [facility] with volume overload.  Past Medical History:  Diagnosis Date  . Chronic renal insufficiency, stage 2 (mild)   . Coronary artery disease   . History of duodenal ulcer 1970s  . Hyperlipidemia   . Hypertension   . NSTEMI (non-ST elevated myocardial infarction) ([place]) 06/01/2017  . Type II diabetes mellitus (H[facility])      Allergies  Allergen Reactions  . Beef-Derived Products Hives    Red meat  . Sulfa Antibiotics Swelling     Current Outpatient Medications  Medication Sig Dispense Refill  . amLODipine (NORVASC) 5 MG tablet Take 1 tablet (5 mg total) by mouth daily. 90 tablet 1  . aspirin EC 81 MG tablet Take 1 tablet (81 mg total) by mouth daily.    Marland Kitchen atorvastatin (LIPITOR) 80 MG tablet Take 1 tablet (80 mg total) by mouth daily at 6 PM. 30 tablet 6  . clopidogrel (PLAVIX) 75 MG tablet Take 75 mg by mouth daily.    . furosemide (LASIX) 40 MG tablet TAKE 20 MG DAILY MAY TAKE 40  MG AS NEEDED FOR SWELLING 135 tablet 1  . insulin lispro (HUMALOG) 100 UNIT/ML injection Inject 5 Units into the skin See admin instructions. Per Sliding Scale    . isosorbide mononitrate (IMDUR) 30 MG 24 hr tablet Take 1 tablet (30 mg total) by mouth daily. 30 tablet 6  . metoprolol tartrate (LOPRESSOR) 25 MG tablet Take 0.5 tablets (12.5 mg total) by mouth daily. 30 tablet 6  . nitroGLYCERIN (NITROSTAT) 0.4 MG SL tablet Place 1 tablet (0.4 mg total) under the tongue every 5 (five) minutes x 3 doses as needed for chest pain. 25 tablet 12  . Polyethyl Glycol-Propyl Glycol (SYSTANE OP) Apply 2 drops to eye daily as needed (Dry eyes).    Tyler Aas FLEXTOUCH 100 UNIT/ML SOPN FlexTouch Pen Inject 25 Units into the skin every morning.      No current facility-administered medications for this visit.      Past Surgical History:  Procedure Laterality Date  . CATARACT EXTRACTION W/ INTRAOCULAR LENS IMPLANT Left   . CORONARY ANGIOPLASTY WITH STENT PLACEMENT  06/02/2017  . CORONARY BALLOON ANGIOPLASTY N/A 06/02/2017   Procedure: CORONARY BALLOON ANGIOPLASTY;  Surgeon: Troy Sine, MD;  Location: [facility];  Service: Cardiovascular;  Laterality: N/A;  . CORONARY STENT INTERVENTION N/A 06/02/2017   Procedure: CORONARY STENT INTERVENTION;  Surgeon: Claiborne Billings,  Joyice Faster, MD;  Location: [facility];  Service: Cardiovascular;  Laterality: N/A;  . EYE SURGERY Right 1950s   "S/P injury in ~ 1942 which ; put a hole in the retina; can't see out of it; cataract developed; surgery to remove cataract in 1950  . LEFT HEART CATH AND CORONARY ANGIOGRAPHY N/A 06/02/2017   Procedure: LEFT HEART CATH AND CORONARY ANGIOGRAPHY;  Surgeon: Troy Sine, MD;  Location: [facility];  Service: Cardiovascular;  Laterality: N/A;  . TONSILLECTOMY  1930s     Allergies  Allergen Reactions  . Beef-Derived Products Hives    Red meat  . Sulfa Antibiotics Swelling      Family History  Problem Relation  Age of Onset  . Diabetes Mother   . Hypertension Mother   . Pulmonary disease Father   . Pulmonary disease Sister   . Healthy Sister      Social History Erik Logan reports that he has quit smoking. His smoking use included cigarettes. He has a 32.00 pack-year smoking history. He has never used smokeless tobacco. Erik Logan reports that he does not drink alcohol.   Review of Systems CONSTITUTIONAL: No weight loss, fever, chills, weakness or fatigue.  HEENT: Eyes: No visual loss, blurred vision, double vision or yellow sclerae.No hearing loss, sneezing, congestion, runny nose or sore throat.  SKIN: No rash or itching.  CARDIOVASCULAR: per hpi RESPIRATORY: No shortness of breath, cough or sputum.  GASTROINTESTINAL: No anorexia, nausea, vomiting or diarrhea. No abdominal pain or blood.  GENITOURINARY: No burning on urination, no polyuria NEUROLOGICAL: No headache, dizziness, syncope, paralysis, ataxia, numbness or tingling in the extremities. No change in bowel or bladder control.  MUSCULOSKELETAL: No muscle, back pain, joint pain or stiffness.  LYMPHATICS: No enlarged nodes. No history of splenectomy.  PSYCHIATRIC: No history of depression or anxiety.  ENDOCRINOLOGIC: No reports of sweating, cold or heat intolerance. No polyuria or polydipsia.  Marland Kitchen   Physical Examination Vitals:   10/26/17 1301  BP: (!) 143/63  Pulse: (!) 56  SpO2: 96%   Vitals:   10/26/17 1301  Weight: 191 lb 6.4 oz (86.8 kg)  Height: 5' 8.5" (1.74 m)    Gen: resting comfortably, no acute distress HEENT: no scleral icterus, pupils equal round and reactive, no palptable cervical adenopathy,  CV: RRR, no m/r/g, no jvd Resp: Clear to auscultation bilaterally GI: abdomen is soft, non-tender, non-distended, normal bowel sounds, no hepatosplenomegaly MSK: extremities are warm, trace bilateral edema Skin: warm, no rash Neuro:  no focal deficits Psych: appropriate affect   Diagnostic Studies 05/2017  cath  Prox RCA lesion is 95% stenosed.  Prox RCA to Mid RCA lesion is 50% stenosed.  Ramus lesion is 50% stenosed.  Ost 2nd Diag to 2nd Diag lesion is 100% stenosed.  RPDA lesion is 90% stenosed.  Balloon angioplasty was performed using a BALLOON SAPPHIRE 2.0X12.  Post intervention, there is a 0% residual stenosis.  Dist RCA lesion is 90% stenosed.  Post intervention, there is a 0% residual stenosis.  A stent was successfully placed.  A stent was successfully placed.  Post intervention, there is a 0% residual stenosis.  Post intervention, there is a 0% residual stenosis.  Ost 1st Diag to 1st Diag lesion is 85% stenosed.  Multi-vessel CAD with 85% long stenosis in the first diagonal Jeret Goyer of the LAD, initial total occlusion of the second diagonal vessel with later mild opening to 99%, 50% stenosis in the ramus intermediate vessel; normal left  circumflex vessel; and diffusely diseased dominant RCA with 95% proximal stenosis followed by diffuse irregularity of 50%, 90% focal stenosis distally beyond the acute margin prior to the PDA, and focal 90% mid PDA stenosis.  LVEDP 17 mm.  Successful multi lesion PCI to the RCA with PTCA of the mid PDA 90% stenosis reduced to 0%; PCI/DES stenting of the 90% distal RCA stenosis treated with a 2.515 mm Resolute stent post dilated to 2.7 mm with the stenosis being reduced to 0%, and insertion of a 3.026 mm Resolute stent to cover the 95 and 50% proximal to mid RCA stenoses, post dilated up to 3.2 mm with the stenoses being reduced to 0%.  RECOMMENDATION: Echo Doppler evaluation for LV function assessment. If the patient develops recurrent chest pain symptomatology, consider possible PCI to the second diagonal vessel. Otherwise, consider medical therapy with beta blocker, nitrates. Recommended dual antiplatelet therapy for minimum of 1 year. Aggressive lipid intervention.  05/2017 echo Study Conclusions  - Left ventricle: The  cavity size was normal. Wall thickness was increased in a pattern of mild LVH. Systolic function was normal. The estimated ejection fraction was in the range of 55% to 60%. Wall motion was normal; there were no regional wall motion abnormalities. Doppler parameters are consistent with abnormal left ventricular relaxation (grade 1 diastolic dysfunction). - Mitral valve: Mildly calcified annulus. Valve area by pressure half-time: 1.85 cm^2.    Assessment and Plan   1. CAD - recent stenting as described above, continue DAPT at least until 05/2018 - not on ACE-I due to poor renal function - changed from brillinta to plavix due to SOB  - doing well without symptoms, continue current meds  2. LE edema - improved off hydralazine and with lower norvasc dose.  - continue lasix  3. HTN - reasonable bp control given his issues with dizzieness - continue current meds  4. CKD III - stable renal function, contniue to monitor.    F/u 6 months  Arnoldo Lenis, M.D.

## 2017-10-30 NOTE — Progress Notes (Signed)
Cardiac Individual Treatment Plan  Patient Details  Name: Erik Logan MRN: [id number] Date of Birth: [birth year] Referring Provider:     CARDIAC REHAB PHASE II ORIENTATION from 07/24/2017 in [place]  Referring Provider  Dr. Stanford Breed      Initial Encounter Date:    CARDIAC REHAB PHASE II ORIENTATION from 07/24/2017 in [place]  Date  07/24/17      Visit Diagnosis: Status post coronary artery stent placement  NSTEMI (non-ST elevated myocardial infarction) ([place])  Patient's Home Medications on Admission:  Current Outpatient Medications:  .  amLODipine (NORVASC) 5 MG tablet, Take 1 tablet (5 mg total) by mouth daily., Disp: 90 tablet, Rfl: 1 .  aspirin EC 81 MG tablet, Take 1 tablet (81 mg total) by mouth daily., Disp: , Rfl:  .  atorvastatin (LIPITOR) 80 MG tablet, Take 1 tablet (80 mg total) by mouth daily at 6 PM., Disp: 30 tablet, Rfl: 6 .  clopidogrel (PLAVIX) 75 MG tablet, Take 75 mg by mouth daily., Disp: , Rfl:  .  furosemide (LASIX) 40 MG tablet, TAKE 20 MG DAILY MAY TAKE 40 MG AS NEEDED FOR SWELLING, Disp: 135 tablet, Rfl: 1 .  insulin lispro (HUMALOG) 100 UNIT/ML injection, Inject 5 Units into the skin See admin instructions. Per Sliding Scale, Disp: , Rfl:  .  isosorbide mononitrate (IMDUR) 30 MG 24 hr tablet, Take 1 tablet (30 mg total) by mouth daily., Disp: 30 tablet, Rfl: 6 .  metoprolol tartrate (LOPRESSOR) 25 MG tablet, Take 0.5 tablets (12.5 mg total) by mouth daily., Disp: 30 tablet, Rfl: 6 .  nitroGLYCERIN (NITROSTAT) 0.4 MG SL tablet, Place 1 tablet (0.4 mg total) under the tongue every 5 (five) minutes x 3 doses as needed for chest pain., Disp: 25 tablet, Rfl: 12 .  Polyethyl Glycol-Propyl Glycol (SYSTANE OP), Apply 2 drops to eye daily as needed (Dry eyes)., Disp: , Rfl:  .  TRESIBA FLEXTOUCH 100 UNIT/ML SOPN FlexTouch Pen, Inject 25 Units into the skin every morning. , Disp: , Rfl:   Past Medical History: Past  Medical History:  Diagnosis Date  . Chronic renal insufficiency, stage 2 (mild)   . Coronary artery disease   . History of duodenal ulcer 1970s  . Hyperlipidemia   . Hypertension   . NSTEMI (non-ST elevated myocardial infarction) ([place]) 06/01/2017  . Type II diabetes mellitus ([facility])     Tobacco Use: Social History   Tobacco Use  Smoking Status Former Smoker  . Packs/day: 1.00  . Years: 32.00  . Pack years: 32.00  . Types: Cigarettes  Smokeless Tobacco Never Used  Tobacco Comment   Quit around age 56    Labs: Recent Review Flowsheet Data    Labs for ITP Cardiac and Pulmonary Rehab Latest Ref Rng & Units 06/01/2017 06/02/2017   Cholestrol 0 - 200 mg/dL - 171   LDLCALC 0 - 99 mg/dL - 116(H)   HDL >40 mg/dL - 34(L)   Trlycerides <150 mg/dL - 106   Hemoglobin A1c 4.8 - 5.6 % 9.1(H) -      Capillary Blood Glucose: Lab Results  Component Value Date   GLUCAP 239 (H) 06/03/2017   GLUCAP 152 (H) 06/03/2017   GLUCAP 151 (H) 06/02/2017   GLUCAP 156 (H) 06/02/2017   GLUCAP 131 (H) 06/02/2017     Exercise Target Goals: Exercise Program Goal: Individual exercise prescription set using results from initial 6 min walk test and THRR while considering  patient's activity barriers and  safety.   Exercise Prescription Goal: Starting with aerobic activity 30 plus minutes a day, 3 days per week for initial exercise prescription. Provide home exercise prescription and guidelines that participant acknowledges understanding prior to discharge.  Activity Barriers & Risk Stratification: Activity Barriers & Cardiac Risk Stratification - 07/24/17 1013      Activity Barriers & Cardiac Risk Stratification   Activity Barriers  Back Problems;Deconditioning;Other (comment)   Chronic (L) knee paing that flares up from time to time.   Comments  Lightheadedness during walk test. He states he does not experience being light headed at home.     Cardiac Risk Stratification  High       6 Minute  Walk: 6 Minute Walk    Row Name 07/24/17 1012 10/29/17 0814       6 Minute Walk   Phase  Initial  Discharge    Distance  1250 feet  1250 feet    Distance % Change  0 %  -    Distance Feet Change  0 ft  1250 ft    Walk Time  6 minutes  6 minutes    # of Rest Breaks  0  0    MPH  2.36  2.36    METS  2.81  2.81    RPE  13  12    Perceived Dyspnea   11  12    VO2 Peak  6.23  6.24    Symptoms  Yes (comment)  No    Comments  Lightheadedness  -    Resting HR  57 bpm  62 bpm    Resting BP  132/62  150/50    Resting Oxygen Saturation   100 %  98 %    Exercise Oxygen Saturation  during 6 min walk  89 %  91 %    Max Ex. HR  83 bpm  80 bpm    Max Ex. BP  148/68  158/64    2 Minute Post BP  136/64  150/68       Oxygen Initial Assessment:   Oxygen Re-Evaluation:   Oxygen Discharge (Final Oxygen Re-Evaluation):   Initial Exercise Prescription: Initial Exercise Prescription - 07/24/17 1000      Date of Initial Exercise RX and Referring Provider   Date  07/24/17    Referring Provider  Dr. Stanford Breed      Treadmill   MPH  1.2    Grade  0    Minutes  15    METs  1.9      NuStep   Level  0    SPM  72    Minutes  20    METs  1.5      Prescription Details   Frequency (times per week)  3    Duration  Progress to 30 minutes of continuous aerobic without signs/symptoms of physical distress      Intensity   THRR 40-80% of Max Heartrate  [phone]    Ratings of Perceived Exertion  11-13    Perceived Dyspnea  0-4      Progression   Progression  Continue progressive overload as per policy without signs/symptoms or physical distress.      Resistance Training   Training Prescription  Yes    Weight  1    Reps  10-15       Perform Capillary Blood Glucose checks as needed.  Exercise Prescription Changes:  Exercise Prescription Changes    Row Name 07/27/17 1200 07/30/17  1500 08/14/17 1400 09/01/17 0700 09/15/17 0700     Response to Exercise   Blood Pressure (Admit)  -   120/60  142/60  154/68  142/62   Blood Pressure (Exercise)  -  144/60  152/70  148/62  140/88   Blood Pressure (Exit)  -  130/50  128/68  150/88  140/70   Heart Rate (Admit)  -  62 bpm  60 bpm  53 bpm  56 bpm   Heart Rate (Exercise)  -  84 bpm  100 bpm  81 bpm  80 bpm   Heart Rate (Exit)  -  70 bpm  69 bpm  63 bpm  64 bpm   Rating of Perceived Exertion (Exercise)  -  _0 Duration  -  Progress to 30 minutes of  aerobic without signs/symptoms of physical distress  Progress to 30 minutes of  aerobic without signs/symptoms of physical distress  Progress to 30 minutes of  aerobic without signs/symptoms of physical distress  Progress to 30 minutes of  aerobic without signs/symptoms of physical distress   Intensity  -  THRR New 91-105-120  THRR New 90-104-119  THRR New 85-102-118  THRR New 87-103-118     Progression   Progression  -  Continue to progress workloads to maintain intensity without signs/symptoms of physical distress.  Continue to progress workloads to maintain intensity without signs/symptoms of physical distress.  Continue to progress workloads to maintain intensity without signs/symptoms of physical distress.  Continue to progress workloads to maintain intensity without signs/symptoms of physical distress.     Resistance Training   Training Prescription  Yes  Yes  Yes  Yes  Yes   Weight  _1 Reps  10-15  10-15  10-15  10-15  10-15     Treadmill   MPH  1.2  1.2  1.2  1.2  -   Grade  0  0  0  0  -   Minutes  _2 -   METs  1.9  1.9  1.9  1.9  -     NuStep   Level  0  _3 SPM  72  87  101  87  93   Minutes  _4 METs  1.5  2.4  2.2  2.2  1.8     Arm Ergometer   Level  -  -  1.2  -  1.2   Watts  -  -  20  -  18   Minutes  -  -  15  -  15   METs  -  -  2.5  -  2.4     Home Exercise Plan   Plans to continue exercise at  Home (comment)  Home (comment)  Home (comment)  Home (comment)  Home (comment)   Frequency  Add 2  additional days to program exercise sessions.  Add 2 additional days to program exercise sessions.  Add 2 additional days to program exercise sessions.  Add 2 additional days to program exercise sessions.  Add 2 additional days to program exercise sessions.   Initial Home Exercises Provided  07/27/17  07/27/17  07/27/17  07/27/17  07/27/17   Row Name 10/03/17 1400 10/19/17 0700  Response to Exercise   Blood Pressure (Admit)  140/62  120/60      Blood Pressure (Exercise)  142/74  158/60      Blood Pressure (Exit)  124/62  140/60      Heart Rate (Admit)  58 bpm  53 bpm      Heart Rate (Exercise)  83 bpm  70 bpm      Heart Rate (Exit)  67 bpm  62 bpm      Rating of Perceived Exertion (Exercise)  12  12      Duration  Progress to 30 minutes of  aerobic without signs/symptoms of physical distress  Progress to 30 minutes of  aerobic without signs/symptoms of physical distress      Intensity  THRR unchanged  THRR New 85-102-118        Progression   Progression  Continue to progress workloads to maintain intensity without signs/symptoms of physical distress.  Continue to progress workloads to maintain intensity without signs/symptoms of physical distress.      Average METs  2.55  2.5        Resistance Training   Training Prescription  Yes  Yes      Weight  3  4      Reps  10-15  10-15        Treadmill   MPH  - Had to stop doing TM due to knee and back pain. Switched to   -        NuStep   Level  2  2      SPM  106  82      Minutes  20  20      METs  2  1.9        Arm Ergometer   Level  1.8  2.3      Watts  28  28      Minutes  15  15      METs  3.1  3.1        Home Exercise Plan   Plans to continue exercise at  Home (comment)  Home (comment)      Frequency  Add 2 additional days to program exercise sessions.  Add 2 additional days to program exercise sessions.      Initial Home Exercises Provided  07/27/17  07/27/17         Exercise Comments:  Exercise Comments     Row Name 07/27/17 1242 07/30/17 1517 08/14/17 1440 09/01/17 0802 09/15/17 0738   Exercise Comments  Patient recieved the take home exercise plan today. THr was addressed as were safety guidelines for being active when not in CR. Patient demonstrated an understanding and was encouraged to ask any future questions as they arise.   Patient has just started Cr and will be progressed in time in tune to his exercise goals  Patinet is doing well in CR. patient was taken ff of teh treadmill due to dizziness and light headed feeling and was placed on the arm ergometer. patient is doing very well on his two machines and has increased his SPMs on both since being on them   Patient is doing well in CR. Patient was moved back to treadmill due to lack of dizziness. Patient has been handling the change very well. Patient has also increased dumbbell size to 2lbs.   Patient has been doing well in CR. patient has increased the sumbbell size to 3lbs and has also increased his level on the  nustep machine. Patient has been moved back to the arm ergometer due to knee and joint pain   Row Name 10/19/17 0710           Exercise Comments  Patient has progressed well. He has achieved his expected goals. Graduates in 2 more visits.           Exercise Goals and Review:  Exercise Goals    Row Name 07/24/17 1015             Exercise Goals   Increase Physical Activity  Yes       Intervention  Provide advice, education, support and counseling about physical activity/exercise needs.;Develop an individualized exercise prescription for aerobic and resistive training based on initial evaluation findings, risk stratification, comorbidities and participant's personal goals.       Expected Outcomes  Short Term: Attend rehab on a regular basis to increase amount of physical activity.       Increase Strength and Stamina  Yes       Intervention  Provide advice, education, support and counseling about physical activity/exercise  needs.;Develop an individualized exercise prescription for aerobic and resistive training based on initial evaluation findings, risk stratification, comorbidities and participant's personal goals.       Expected Outcomes  Short Term: Increase workloads from initial exercise prescription for resistance, speed, and METs.       Able to understand and use rate of perceived exertion (RPE) scale  Yes       Intervention  Provide education and explanation on how to use RPE scale       Expected Outcomes  Short Term: Able to use RPE daily in rehab to express subjective intensity level;Long Term:  Able to use RPE to guide intensity level when exercising independently       Able to understand and use Dyspnea scale  Yes       Intervention  Provide education and explanation on how to use Dyspnea scale       Expected Outcomes  Short Term: Able to use Dyspnea scale daily in rehab to express subjective sense of shortness of breath during exertion;Long Term: Able to use Dyspnea scale to guide intensity level when exercising independently       Knowledge and understanding of Target Heart Rate Range (THRR)  Yes       Intervention  Provide education and explanation of THRR including how the numbers were predicted and where they are located for reference       Expected Outcomes  Short Term: Able to state/look up THRR;Long Term: Able to use THRR to govern intensity when exercising independently;Short Term: Able to use daily as guideline for intensity in rehab       Able to check pulse independently  Yes       Intervention  Provide education and demonstration on how to check pulse in carotid and radial arteries.;Review the importance of being able to check your own pulse for safety during independent exercise       Expected Outcomes  Short Term: Able to explain why pulse checking is important during independent exercise;Long Term: Able to check pulse independently and accurately       Understanding of Exercise Prescription   Yes       Intervention  Provide education, explanation, and written materials on patient's individual exercise prescription       Expected Outcomes  Short Term: Able to explain program exercise prescription;Long Term: Able to explain home exercise prescription to exercise  independently          Exercise Goals Re-Evaluation : Exercise Goals Re-Evaluation    [place] Name 07/30/17 1517 08/25/17 0743 09/21/17 0740 10/19/17 0706       Exercise Goal Re-Evaluation   Exercise Goals Review  Increase Physical Activity;Increase Strength and Stamina;Able to check pulse independently;Able to understand and use Dyspnea scale;Able to understand and use rate of perceived exertion (RPE) scale;Knowledge and understanding of Target Heart Rate Range (THRR);Understanding of Exercise Prescription  Increase Physical Activity;Increase Strength and Stamina;Able to check pulse independently;Able to understand and use Dyspnea scale;Able to understand and use rate of perceived exertion (RPE) scale;Knowledge and understanding of Target Heart Rate Range (THRR);Understanding of Exercise Prescription  Increase Physical Activity;Increase Strength and Stamina;Able to check pulse independently;Able to understand and use Dyspnea scale;Able to understand and use rate of perceived exertion (RPE) scale;Knowledge and understanding of Target Heart Rate Range (THRR);Understanding of Exercise Prescription  Increase Strength and Stamina    Comments  Patient has just started Cr and will be progressed in time in tune to his exercise goals  Patient is doing very well in CR. Patient has tstaed to me that he is really enjoying the program and the exercises that he is doing here. He states that he is eating better and has gained more mobility from the exercise program.   Patient has been doing very well in CR. Patient has stated to me that he has gained more mobility since beginning his time here in CR. Patient states that he feels no back pain on the  machines and that he has less and less dizziness.   Patient will graduate in 2 more visits. He says he has gained mobility and has returned to his normal ADL's. he also says his back pain has improved with exercise. Still has pain in his knees when walking to much so we were not able to put him back on the TM. He as progressed at a steady rate on equipment and hand held weights.     Expected Outcomes  Patient wishes to gain less back pain and to gain more mobility and ADLs.  Patient wishes to gain less back pain and to gain more mobility and ADLs.  Patient wishes to gain less back pain and to gain more mobility and ADLs.  Has achieved his goals of gaining mobility, returning to ADL's. Has less back discomfort.         Discharge Exercise Prescription (Final Exercise Prescription Changes): Exercise Prescription Changes - 10/19/17 0700      Response to Exercise   Blood Pressure (Admit)  120/60    Blood Pressure (Exercise)  158/60    Blood Pressure (Exit)  140/60    Heart Rate (Admit)  53 bpm    Heart Rate (Exercise)  70 bpm    Heart Rate (Exit)  62 bpm    Rating of Perceived Exertion (Exercise)  12    Duration  Progress to 30 minutes of  aerobic without signs/symptoms of physical distress    Intensity  THRR New   85-102-118     Progression   Progression  Continue to progress workloads to maintain intensity without signs/symptoms of physical distress.    Average METs  2.5      Resistance Training   Training Prescription  Yes    Weight  4    Reps  10-15      NuStep   Level  2    SPM  82    Minutes  20    METs  1.9      Arm Ergometer   Level  2.3    Watts  28    Minutes  15    METs  3.1      Home Exercise Plan   Plans to continue exercise at  Home (comment)    Frequency  Add 2 additional days to program exercise sessions.    Initial Home Exercises Provided  07/27/17       Nutrition:  Target Goals: Understanding of nutrition guidelines, daily intake of sodium <155m,  cholesterol <2062m calories 30% from fat and 7% or less from saturated fats, daily to have 5 or more servings of fruits and vegetables.  Biometrics: Pre Biometrics - 07/24/17 1015      Pre Biometrics   Height  5' 7" (1.702 m)    Weight  84.6 kg    Waist Circumference  19 inches    Hip Circumference  19 inches    Waist to Hip Ratio  1 %    BMI (Calculated)  29.22    Triceps Skinfold  10 mm    % Body Fat  26.7 %    Grip Strength  70.3 kg    Flexibility  0 in    Single Leg Stand  3 seconds      Post Biometrics - 10/29/17 0841       Post  Biometrics   Height  5' 7" (1.702 m)    Weight  85.7 kg    Waist Circumference  39.5 inches    Hip Circumference  39 inches    Waist to Hip Ratio  1.01 %    BMI (Calculated)  29.59    Triceps Skinfold  9 mm    % Body Fat  26.7 %    Grip Strength  36 kg    Flexibility  0 in    Single Leg Stand  3 seconds       Nutrition Therapy Plan and Nutrition Goals: Nutrition Therapy & Goals - 08/20/17 1633      Personal Nutrition Goals   Nutrition Goal  For heart healthy choices add >50% of whole grains, make half their plate fruits and vegetables. Discuss the difference between starchy vegetables and leafy greens, and how leafy vegetables provide fiber, helps maintain healthy weight, helps control blood glucose, and lowers cholesterol.  Discuss purchasing fresh or frozen vegetable to reduce sodium and not to add grease, fat or sugar. Consume <18oz of red meat per week. Consume lean cuts of meats and very little of meats high in sodium and nitrates such as pork and lunch meats. Discussed portion control for all food groups.    Personal Goal #2  Patient has been eating a diabetic diet for years. He has added low sodium diet since recent heart event along with heart healthy diet.     Additional Goals?  No      Intervention Plan   Intervention  Nutrition handout(s) given to patient.    Expected Outcomes  Short Term Goal: Understand basic principles of  dietary content, such as calories, fat, sodium, cholesterol and nutrients.       Nutrition Assessments: Nutrition Assessments - 10/23/17 1426      MEDFICTS Scores   Pre Score  68    Post Score  39    Score Difference  -29       Nutrition Goals Re-Evaluation: Nutrition Goals Re-Evaluation    [place] 08/27/17 1546 09/23/17 1249  Goals   Current Weight  190 lb 8 oz (86.4 kg)  189 lb 14.4 oz (86.1 kg)      Nutrition Goal  For heart healthy choices add >50% of whole grains, make half their plate fruits and vegetables. Discuss the difference between starchy vegetables and leafy greens, and how leafy vegetables provide fiber, helps maintain healthy weight, helps control blood glucose, and lowers cholesterol.  Discuss purchasing fresh or frozen vegetable to reduce sodium and not to add grease, fat or sugar. Consume <18oz of red meat per week. Consume lean cuts of meats and very little of meats high in sodium and nitrates such as pork and lunch meats. Discussed portion control for all food groups.  For heart healthy choices add >50% of whole grains, make half their plate fruits and vegetables. Discuss the difference between starchy vegetables and leafy greens, and how leafy vegetables provide fiber, helps maintain healthy weight, helps control blood glucose, and lowers cholesterol.  Discuss purchasing fresh or frozen vegetable to reduce sodium and not to add grease, fat or sugar. Consume <18oz of red meat per week. Consume lean cuts of meats and very little of meats high in sodium and nitrates such as pork and lunch meats. Discussed portion control for all food groups.      Comment  Patient has gained 2 lbs since last 30 day review. He has had some diuretic medication adjustments and says his weight fluctuates. He says he continues to follow a diabetic, low sodium diet.   Patient has lost 1 lb since last 30 day review. He continues to follow a diabetic, low sodium diet.       Expected  Outcome  Patient will continue to meet his nutritional goals.   Patient will continue to meet his nutritional goals.          Nutrition Goals Discharge (Final Nutrition Goals Re-Evaluation): Nutrition Goals Re-Evaluation - 09/23/17 1249      Goals   Current Weight  189 lb 14.4 oz (86.1 kg)    Nutrition Goal  For heart healthy choices add >50% of whole grains, make half their plate fruits and vegetables. Discuss the difference between starchy vegetables and leafy greens, and how leafy vegetables provide fiber, helps maintain healthy weight, helps control blood glucose, and lowers cholesterol.  Discuss purchasing fresh or frozen vegetable to reduce sodium and not to add grease, fat or sugar. Consume <18oz of red meat per week. Consume lean cuts of meats and very little of meats high in sodium and nitrates such as pork and lunch meats. Discussed portion control for all food groups.    Comment  Patient has lost 1 lb since last 30 day review. He continues to follow a diabetic, low sodium diet.     Expected Outcome  Patient will continue to meet his nutritional goals.        Psychosocial: Target Goals: Acknowledge presence or absence of significant depression and/or stress, maximize coping skills, provide positive support system. Participant is able to verbalize types and ability to use techniques and skills needed for reducing stress and depression.  Initial Review & Psychosocial Screening: Initial Psych Review & Screening - 07/24/17 1135      Initial Review   Current issues with  None Identified      Family Dynamics   Good Support System?  Yes      Barriers   Psychosocial barriers to participate in program  There are no identifiable barriers or psychosocial needs.  Screening Interventions   Interventions  Encouraged to exercise    Expected Outcomes  Short Term goal: Identification and review with participant of any Quality of Life or Depression concerns found by scoring the  questionnaire.;Long Term goal: The participant improves quality of Life and PHQ9 Scores as seen by post scores and/or verbalization of changes       Quality of Life Scores: Quality of Life - 10/21/17 0827      Quality of Life   Select  Quality of Life      Quality of Life Scores   Health/Function Pre  21.37 %    Health/Function Post  22.67 %    Health/Function % Change  6.08 %    Socioeconomic Pre  25 %    Socioeconomic Post  24.25 %    Socioeconomic % Change   -3 %    Psych/Spiritual Pre  22.75 %    Psych/Spiritual Post  23.57 %    Psych/Spiritual % Change  3.6 %    Family Pre  25.9 %    Family Post  24.9 %    Family % Change  -3.86 %    GLOBAL Pre  23.02 %    GLOBAL Post  23.48 %    GLOBAL % Change  2 %      Scores of 19 and below usually indicate a poorer quality of life in these areas.  A difference of  2-3 points is a clinically meaningful difference.  A difference of 2-3 points in the total score of the Quality of Life Index has been associated with significant improvement in overall quality of life, self-image, physical symptoms, and general health in studies assessing change in quality of life.  PHQ-9: Recent Review Flowsheet Data    Depression screen [facility] 2/9 10/23/2017 07/24/2017   Decreased Interest 0 0   Down, Depressed, Hopeless 0 0   PHQ - 2 Score 0 0   Altered sleeping 0 0   Tired, decreased energy 1 1   Change in appetite 0 0   Feeling bad or failure about yourself  0 0   Trouble concentrating 0 0   Moving slowly or fidgety/restless 0 0   Suicidal thoughts 0 0   PHQ-9 Score 1 1   Difficult doing work/chores Not difficult at all Somewhat difficult     Interpretation of Total Score  Total Score Depression Severity:  1-4 = Minimal depression, 5-9 = Mild depression, 10-14 = Moderate depression, 15-19 = Moderately severe depression, 20-27 = Severe depression   Psychosocial Evaluation and Intervention: Psychosocial Evaluation - 10/30/17 0749      Discharge  Psychosocial Assessment & Intervention   Comments  Patient has no psychosocial issues identified at discharge. His QOL score improved by 6% at 23.48%. HIs exit PHQ-9 score was 1.       Psychosocial Re-Evaluation: Psychosocial Re-Evaluation    [place] Name 08/27/17 1554 09/23/17 1252           Psychosocial Re-Evaluation   Current issues with  None Identified  None Identified      Comments  Patient's initial QOL score was 23.02 and his PHQ-9 score was 1 with no psychosocial issues identified at his orientation visit.   Patient's initial QOL score was 23.02 and his PHQ-9 score was 1 with no psychosocial issues identified at his orientation visit.       Expected Outcomes  Patient will have no psychosocial issues identified at discharge.   Patient will have no psychosocial  issues identified at discharge.       Interventions  Relaxation education;Encouraged to attend Cardiac Rehabilitation for the exercise;Stress management education  Relaxation education;Encouraged to attend Cardiac Rehabilitation for the exercise;Stress management education      Continue Psychosocial Services   No Follow up required  No Follow up required         Psychosocial Discharge (Final Psychosocial Re-Evaluation): Psychosocial Re-Evaluation - 09/23/17 1252      Psychosocial Re-Evaluation   Current issues with  None Identified    Comments  Patient's initial QOL score was 23.02 and his PHQ-9 score was 1 with no psychosocial issues identified at his orientation visit.     Expected Outcomes  Patient will have no psychosocial issues identified at discharge.     Interventions  Relaxation education;Encouraged to attend Cardiac Rehabilitation for the exercise;Stress management education    Continue Psychosocial Services   No Follow up required       Vocational Rehabilitation: Provide vocational rehab assistance to qualifying candidates.   Vocational Rehab Evaluation & Intervention: Vocational Rehab - 07/24/17 1118       Initial Vocational Rehab Evaluation & Intervention   Assessment shows need for Vocational Rehabilitation  No       Education: Education Goals: Education classes will be provided on a weekly basis, covering required topics. Participant will state understanding/return demonstration of topics presented.  Learning Barriers/Preferences: Learning Barriers/Preferences - 07/24/17 1117      Learning Barriers/Preferences   Learning Barriers  None    Learning Preferences  Written Material;Pictoral;Video       Education Topics: Hypertension, Hypertension Reduction -Define heart disease and high blood pressure. Discus how high blood pressure affects the body and ways to reduce high blood pressure.   CARDIAC REHAB PHASE II EXERCISE from 10/21/2017 in [place]  Date  10/21/17  Educator  DC  Instruction Review Code  2- Demonstrated Understanding      Exercise and Your Heart -Discuss why it is important to exercise, the FITT principles of exercise, normal and abnormal responses to exercise, and how to exercise safely.   CARDIAC REHAB PHASE II EXERCISE from 10/21/2017 in [place]  Date  07/29/17  Educator  [place]  Instruction Review Code  2- Demonstrated Understanding      Angina -Discuss definition of angina, causes of angina, treatment of angina, and how to decrease risk of having angina.   CARDIAC REHAB PHASE II EXERCISE from 10/21/2017 in [place]  Date  08/05/17  Educator  DC  Instruction Review Code  2- Demonstrated Understanding      Cardiac Medications -Review what the following cardiac medications are used for, how they affect the body, and side effects that may occur when taking the medications.  Medications include Aspirin, Beta blockers, calcium channel blockers, ACE Inhibitors, angiotensin receptor blockers, diuretics, digoxin, and antihyperlipidemics.   CARDIAC REHAB PHASE II EXERCISE from 10/21/2017 in [place]  Date  08/12/17  Educator  DC  Instruction Review Code  2- Demonstrated Understanding      Congestive Heart Failure -Discuss the definition of CHF, how to live with CHF, the signs and symptoms of CHF, and how keep track of weight and sodium intake.   CARDIAC REHAB PHASE II EXERCISE from 10/21/2017 in [place]  Date  08/19/17  Educator  DC  Instruction Review Code  2- Demonstrated Understanding      Heart Disease and Intimacy -Discus the effect sexual  activity has on the heart, how changes occur during intimacy as we age, and safety during sexual activity.   CARDIAC REHAB PHASE II EXERCISE from 10/21/2017 in [place]  Date  08/26/17  Educator  [place]  Instruction Review Code  2- Demonstrated Understanding      Smoking Cessation / COPD -Discuss different methods to quit smoking, the health benefits of quitting smoking, and the definition of COPD.   CARDIAC REHAB PHASE II EXERCISE from 10/21/2017 in [place]  Date  09/02/17  Educator  DJ  Instruction Review Code  2- Demonstrated Understanding      Nutrition I: Fats -Discuss the types of cholesterol, what cholesterol does to the heart, and how cholesterol levels can be controlled.   CARDIAC REHAB PHASE II EXERCISE from 10/21/2017 in [place]  Date  09/16/17  Educator  DJ  Instruction Review Code  2- Demonstrated Understanding      Nutrition II: Labels -Discuss the different components of food labels and how to read food label   [place] from 10/21/2017 in [place]  Date  09/09/17  Educator  DC  Instruction Review Code  2- Demonstrated Understanding      Heart Parts/Heart Disease and PAD -Discuss the anatomy of the heart, the pathway of blood circulation through the heart, and these are affected by heart disease.   CARDIAC REHAB PHASE II EXERCISE from  10/21/2017 in [place]  Date  09/23/17  Educator  DC  Instruction Review Code  2- Demonstrated Understanding      Stress I: Signs and Symptoms -Discuss the causes of stress, how stress may lead to anxiety and depression, and ways to limit stress.   CARDIAC REHAB PHASE II EXERCISE from 10/21/2017 in [place]  Date  09/30/17  Educator  D. C[place]  Instruction Review Code  2- Demonstrated Understanding      Stress II: Relaxation -Discuss different types of relaxation techniques to limit stress.   CARDIAC REHAB PHASE II EXERCISE from 10/21/2017 in [place]  Date  10/07/17  Educator  DC  Instruction Review Code  2- Demonstrated Understanding      Warning Signs of Stroke / TIA -Discuss definition of a stroke, what the signs and symptoms are of a stroke, and how to identify when someone is having stroke.   Knowledge Questionnaire Score: Knowledge Questionnaire Score - 10/23/17 1426      Knowledge Questionnaire Score   Pre Score  20/24    Post Score  23/24       Core Components/Risk Factors/Patient Goals at Admission: Personal Goals and Risk Factors at Admission - 07/24/17 1127      Core Components/Risk Factors/Patient Goals on Admission    Weight Management  Weight Maintenance    Personal Goal Other  Yes    Personal Goal  Get back to normal ADL's, have less back pain and to be more mobile    Intervention  Attend CR 3 x week and supplement with home exercise 2 x week.     Expected Outcomes  Achieve personal goals       Core Components/Risk Factors/Patient Goals Review:  Goals and Risk Factor Review    Row Name 08/27/17 1550 09/23/17 1249 10/30/17 0743         Core Components/Risk Factors/Patient Goals Review   Personal Goals Review  Weight Management/Obesity;Diabetes Get back to normal ADL's; have less back pain;  be mobile.   Weight Management/Obesity;Diabetes Get back to normal ADL's;  have less back  pain; be more mobile.   Weight Management/Obesity;Diabetes Get back to normal ADL's; have less back pain; be more mobile.      Review  Patient has completed 14 sessions gaining one lb since last 30 day review. He says his weight fluctuates and he has had some recent medication adjustments in his diuretic medication. He is doing well in the program with progression. He says his back pain remains the same. He says he feels like his strength has increased in his upper body and he has recently done some gardening with improved stamina. He feels like he has more energy. His last A1C was 06/01/17 at 9.1. He reported fasting glucose readings have been averagin 125. Will continue to monitor for progress.   Patient has completed 26 sessions maintaining his weight. He continues to do well in the program with progression. His back and knee pain remain the same. He saw an orthopedic MD yesterday and he injected his knee with Cortisone with improvment in his pain. He says his mobility has improved and he feels stronger and has better stamina. Will continue to monitor for progress.   Patient graduated with 35 sessions gaining 3 lbs overall in the program. Patient said he is eating healthier now and his exit medficts score improved by 29 points. He did well in the program saying he is able to do his ADL's. His back pain has remained the same and he also has chronic knee pain which he saw an orthropedic MD for. His exit walk test remained the same but he says he does feel stronger and has more endurance. He is pleased with his progress in the program. He plans to continue exercising by walking.      Expected Outcomes  Patient will continue to attend sessions and will complete the program and meet his personal goals.   Patient will continue to attend sessions and will complete the program and meet his personal goals.   Patient plans to continue exercising by walking and continue to meet his personal goals. CR will f/u for one year.          Core Components/Risk Factors/Patient Goals at Discharge (Final Review):  Goals and Risk Factor Review - 10/30/17 0743      Core Components/Risk Factors/Patient Goals Review   Personal Goals Review  Weight Management/Obesity;Diabetes   Get back to normal ADL's; have less back pain; be more mobile.    Review  Patient graduated with 35 sessions gaining 3 lbs overall in the program. Patient said he is eating healthier now and his exit medficts score improved by 29 points. He did well in the program saying he is able to do his ADL's. His back pain has remained the same and he also has chronic knee pain which he saw an orthropedic MD for. His exit walk test remained the same but he says he does feel stronger and has more endurance. He is pleased with his progress in the program. He plans to continue exercising by walking.     Expected Outcomes  Patient plans to continue exercising by walking and continue to meet his personal goals. CR will f/u for one year.        ITP Comments: ITP Comments    Row Name 07/24/17 1120 08/03/17 0755 10/23/17 1428       ITP Comments  Erik Logan is a pleasant 84 year old gentleman. He has  had NSTEMI with stent placment. He also c/o have (L) knee pain, and back pain. he states that he still has a heart blockage that his cardilogist is watching. His wife is very supportive and patient is eager to get started.   Patient new to program. He has completed 4 sessions. Will continue to monitor for progress.   Patient graduated 10/21/17 with 35 sessions with discharge documentation pending.         Comments: Patient graduated from [place] today on 10/21/2017 after completing 36 sessions. He achieved LTG of 30 minutes of aerobic exercise at Max Met level of 3.1. All patients vitals are WNL. Patient has met with dietician. Discharge instruction has been reviewed in detail and patient stated an understanding of material given. Patient plans to exercise by  walking at home. Cardiac Rehab staff will make f/u calls at 1 month, 6 months, and 1 year. Patient had no complaints of any abnormal S/S or pain on their exit visit.

## 2017-10-30 NOTE — Progress Notes (Signed)
Discharge Progress Report  Patient Details  Name: Erik Logan MRN: [id number] Date of Birth: 09/25/1928 Referring Provider:     CARDIAC REHAB PHASE II ORIENTATION from 07/24/2017 in [place]  Referring Provider  Dr. Stanford Breed       Number of Visits: 35  Reason for Discharge:  Patient reached a stable level of exercise. Patient independent in their exercise. Patient has met program and personal goals.  Smoking History:  Social History   Tobacco Use  Smoking Status Former Smoker  . Packs/day: 1.00  . Years: 32.00  . Pack years: 32.00  . Types: Cigarettes  Smokeless Tobacco Never Used  Tobacco Comment   Quit around age 84    Diagnosis:  Status post coronary artery stent placement  NSTEMI (non-ST elevated myocardial infarction) ([place])  ADL [facility]:   Initial Exercise Prescription: Initial Exercise Prescription - 07/24/17 1000      Date of Initial Exercise RX and Referring Provider   Date  07/24/17    Referring Provider  Dr. Stanford Breed      Treadmill   MPH  1.2    Grade  0    Minutes  15    METs  1.9      NuStep   Level  0    SPM  72    Minutes  20    METs  1.5      Prescription Details   Frequency (times per week)  3    Duration  Progress to 30 minutes of continuous aerobic without signs/symptoms of physical distress      Intensity   THRR 40-80% of Max Heartrate  [phone]    Ratings of Perceived Exertion  11-13    Perceived Dyspnea  0-4      Progression   Progression  Continue progressive overload as per policy without signs/symptoms or physical distress.      Resistance Training   Training Prescription  Yes    Weight  1    Reps  10-15       Discharge Exercise Prescription (Final Exercise Prescription Changes): Exercise Prescription Changes - 10/19/17 0700      Response to Exercise   Blood Pressure (Admit)  120/60    Blood Pressure (Exercise)  158/60    Blood Pressure (Exit)  140/60    Heart Rate (Admit)  53 bpm     Heart Rate (Exercise)  70 bpm    Heart Rate (Exit)  62 bpm    Rating of Perceived Exertion (Exercise)  12    Duration  Progress to 30 minutes of  aerobic without signs/symptoms of physical distress    Intensity  THRR New   85-102-118     Progression   Progression  Continue to progress workloads to maintain intensity without signs/symptoms of physical distress.    Average METs  2.5      Resistance Training   Training Prescription  Yes    Weight  4    Reps  10-15      NuStep   Level  2    SPM  82    Minutes  20    METs  1.9      Arm Ergometer   Level  2.3    Watts  28    Minutes  15    METs  3.1      Home Exercise Plan   Plans to continue exercise at  Home (comment)    Frequency  Add 2 additional days  to program exercise sessions.    Initial Home Exercises Provided  07/27/17       Functional Capacity: 6 Minute Walk    Row Name 07/24/17 1012 10/29/17 0814       6 Minute Walk   Phase  Initial  Discharge    Distance  1250 feet  1250 feet    Distance % Change  0 %  -    Distance Feet Change  0 ft  1250 ft    Walk Time  6 minutes  6 minutes    # of Rest Breaks  0  0    MPH  2.36  2.36    METS  2.81  2.81    RPE  13  12    Perceived Dyspnea   11  12    VO2 Peak  6.23  6.24    Symptoms  Yes (comment)  No    Comments  Lightheadedness  -    Resting HR  57 bpm  62 bpm    Resting BP  132/62  150/50    Resting Oxygen Saturation   100 %  98 %    Exercise Oxygen Saturation  during 6 min walk  89 %  91 %    Max Ex. HR  83 bpm  80 bpm    Max Ex. BP  148/68  158/64    2 Minute Post BP  136/64  150/68       Psychological, QOL, Others - Outcomes: PHQ 2/9: Depression screen [facility] 2/9 10/23/2017 07/24/2017  Decreased Interest 0 0  Down, Depressed, Hopeless 0 0  PHQ - 2 Score 0 0  Altered sleeping 0 0  Tired, decreased energy 1 1  Change in appetite 0 0  Feeling bad or failure about yourself  0 0  Trouble concentrating 0 0  Moving slowly or fidgety/restless 0 0   Suicidal thoughts 0 0  PHQ-9 Score 1 1  Difficult doing work/chores Not difficult at all Somewhat difficult    Quality of Life: Quality of Life - 10/21/17 0827      Quality of Life   Select  Quality of Life      Quality of Life Scores   Health/Function Pre  21.37 %    Health/Function Post  22.67 %    Health/Function % Change  6.08 %    Socioeconomic Pre  25 %    Socioeconomic Post  24.25 %    Socioeconomic % Change   -3 %    Psych/Spiritual Pre  22.75 %    Psych/Spiritual Post  23.57 %    Psych/Spiritual % Change  3.6 %    Family Pre  25.9 %    Family Post  24.9 %    Family % Change  -3.86 %    GLOBAL Pre  23.02 %    GLOBAL Post  23.48 %    GLOBAL % Change  2 %       Personal Goals: Goals established at orientation with interventions provided to work toward goal. Personal Goals and Risk Factors at Admission - 07/24/17 1127      Core Components/Risk Factors/Patient Goals on Admission    Weight Management  Weight Maintenance    Personal Goal Other  Yes    Personal Goal  Get back to normal ADL's, have less back pain and to be more mobile    Intervention  Attend CR 3 x week and supplement with home exercise 2 x week.  Expected Outcomes  Achieve personal goals        Personal Goals Discharge: Goals and Risk Factor Review    Row Name 08/27/17 1550 09/23/17 1249 10/30/17 0743         Core Components/Risk Factors/Patient Goals Review   Personal Goals Review  Weight Management/Obesity;Diabetes Get back to normal ADL's; have less back pain; be mobile.   Weight Management/Obesity;Diabetes Get back to normal ADL's;  have less back pain; be more mobile.   Weight Management/Obesity;Diabetes Get back to normal ADL's; have less back pain; be more mobile.      Review  Patient has completed 14 sessions gaining one lb since last 30 day review. He says his weight fluctuates and he has had some recent medication adjustments in his diuretic medication. He is doing well in the  program with progression. He says his back pain remains the same. He says he feels like his strength has increased in his upper body and he has recently done some gardening with improved stamina. He feels like he has more energy. His last A1C was 06/01/17 at 9.1. He reported fasting glucose readings have been averagin 125. Will continue to monitor for progress.   Patient has completed 26 sessions maintaining his weight. He continues to do well in the program with progression. His back and knee pain remain the same. He saw an orthopedic MD yesterday and he injected his knee with Cortisone with improvment in his pain. He says his mobility has improved and he feels stronger and has better stamina. Will continue to monitor for progress.   Patient graduated with 35 sessions gaining 3 lbs overall in the program. Patient said he is eating healthier now and his exit medficts score improved by 29 points. He did well in the program saying he is able to do his ADL's. His back pain has remained the same and he also has chronic knee pain which he saw an orthropedic MD for. His exit walk test remained the same but he says he does feel stronger and has more endurance. He is pleased with his progress in the program. He plans to continue exercising by walking.      Expected Outcomes  Patient will continue to attend sessions and will complete the program and meet his personal goals.   Patient will continue to attend sessions and will complete the program and meet his personal goals.   Patient plans to continue exercising by walking and continue to meet his personal goals. CR will f/u for one year.         Exercise Goals and Review: Exercise Goals    Row Name 07/24/17 1015             Exercise Goals   Increase Physical Activity  Yes       Intervention  Provide advice, education, support and counseling about physical activity/exercise needs.;Develop an individualized exercise prescription for aerobic and resistive  training based on initial evaluation findings, risk stratification, comorbidities and participant's personal goals.       Expected Outcomes  Short Term: Attend rehab on a regular basis to increase amount of physical activity.       Increase Strength and Stamina  Yes       Intervention  Provide advice, education, support and counseling about physical activity/exercise needs.;Develop an individualized exercise prescription for aerobic and resistive training based on initial evaluation findings, risk stratification, comorbidities and participant's personal goals.       Expected Outcomes  Short Term:  Increase workloads from initial exercise prescription for resistance, speed, and METs.       Able to understand and use rate of perceived exertion (RPE) scale  Yes       Intervention  Provide education and explanation on how to use RPE scale       Expected Outcomes  Short Term: Able to use RPE daily in rehab to express subjective intensity level;Long Term:  Able to use RPE to guide intensity level when exercising independently       Able to understand and use Dyspnea scale  Yes       Intervention  Provide education and explanation on how to use Dyspnea scale       Expected Outcomes  Short Term: Able to use Dyspnea scale daily in rehab to express subjective sense of shortness of breath during exertion;Long Term: Able to use Dyspnea scale to guide intensity level when exercising independently       Knowledge and understanding of Target Heart Rate Range (THRR)  Yes       Intervention  Provide education and explanation of THRR including how the numbers were predicted and where they are located for reference       Expected Outcomes  Short Term: Able to state/look up THRR;Long Term: Able to use THRR to govern intensity when exercising independently;Short Term: Able to use daily as guideline for intensity in rehab       Able to check pulse independently  Yes       Intervention  Provide education and demonstration on  how to check pulse in carotid and radial arteries.;Review the importance of being able to check your own pulse for safety during independent exercise       Expected Outcomes  Short Term: Able to explain why pulse checking is important during independent exercise;Long Term: Able to check pulse independently and accurately       Understanding of Exercise Prescription  Yes       Intervention  Provide education, explanation, and written materials on patient's individual exercise prescription       Expected Outcomes  Short Term: Able to explain program exercise prescription;Long Term: Able to explain home exercise prescription to exercise independently          Nutrition & Weight - Outcomes: Pre Biometrics - 07/24/17 1015      Pre Biometrics   Height  '5\' 7"'$  (1.702 m)    Weight  84.6 kg    Waist Circumference  19 inches    Hip Circumference  19 inches    Waist to Hip Ratio  1 %    BMI (Calculated)  29.22    Triceps Skinfold  10 mm    % Body Fat  26.7 %    Grip Strength  70.3 kg    Flexibility  0 in    Single Leg Stand  3 seconds      Post Biometrics - 10/29/17 0841       Post  Biometrics   Height  '5\' 7"'$  (1.702 m)    Weight  85.7 kg    Waist Circumference  39.5 inches    Hip Circumference  39 inches    Waist to Hip Ratio  1.01 %    BMI (Calculated)  29.59    Triceps Skinfold  9 mm    % Body Fat  26.7 %    Grip Strength  36 kg    Flexibility  0 in    Single Leg Stand  3 seconds       Nutrition: Nutrition Therapy & Goals - 08/20/17 1633      Personal Nutrition Goals   Nutrition Goal  For heart healthy choices add >50% of whole grains, make half their plate fruits and vegetables. Discuss the difference between starchy vegetables and leafy greens, and how leafy vegetables provide fiber, helps maintain healthy weight, helps control blood glucose, and lowers cholesterol.  Discuss purchasing fresh or frozen vegetable to reduce sodium and not to add grease, fat or sugar. Consume <18oz  of red meat per week. Consume lean cuts of meats and very little of meats high in sodium and nitrates such as pork and lunch meats. Discussed portion control for all food groups.    Personal Goal #2  Patient has been eating a diabetic diet for years. He has added low sodium diet since recent heart event along with heart healthy diet.     Additional Goals?  No      Intervention Plan   Intervention  Nutrition handout(s) given to patient.    Expected Outcomes  Short Term Goal: Understand basic principles of dietary content, such as calories, fat, sodium, cholesterol and nutrients.       Nutrition Discharge: Nutrition Assessments - 10/23/17 1426      MEDFICTS Scores   Pre Score  68    Post Score  39    Score Difference  -29       Education Questionnaire Score: Knowledge Questionnaire Score - 10/23/17 1426      Knowledge Questionnaire Score   Pre Score  20/24    Post Score  23/24       Goals reviewed with patient; copy given to patient.

## 2017-11-03 DIAGNOSIS — M1712 Unilateral primary osteoarthritis, left knee: Secondary | ICD-10-CM | POA: Diagnosis not present

## 2017-11-10 DIAGNOSIS — M4726 Other spondylosis with radiculopathy, lumbar region: Secondary | ICD-10-CM | POA: Diagnosis not present

## 2017-11-10 DIAGNOSIS — M545 Low back pain: Secondary | ICD-10-CM | POA: Diagnosis not present

## 2017-11-10 DIAGNOSIS — M1712 Unilateral primary osteoarthritis, left knee: Secondary | ICD-10-CM | POA: Diagnosis not present

## 2017-11-10 DIAGNOSIS — M5441 Lumbago with sciatica, right side: Secondary | ICD-10-CM | POA: Diagnosis not present

## 2017-11-11 DIAGNOSIS — M5126 Other intervertebral disc displacement, lumbar region: Secondary | ICD-10-CM | POA: Diagnosis not present

## 2017-11-11 DIAGNOSIS — I10 Essential (primary) hypertension: Secondary | ICD-10-CM | POA: Diagnosis not present

## 2017-11-11 DIAGNOSIS — M47816 Spondylosis without myelopathy or radiculopathy, lumbar region: Secondary | ICD-10-CM | POA: Diagnosis not present

## 2017-11-11 DIAGNOSIS — S3992XA Unspecified injury of lower back, initial encounter: Secondary | ICD-10-CM | POA: Diagnosis not present

## 2017-11-11 DIAGNOSIS — M48061 Spinal stenosis, lumbar region without neurogenic claudication: Secondary | ICD-10-CM | POA: Diagnosis not present

## 2017-11-11 DIAGNOSIS — E1142 Type 2 diabetes mellitus with diabetic polyneuropathy: Secondary | ICD-10-CM | POA: Diagnosis not present

## 2017-11-11 DIAGNOSIS — M545 Low back pain: Secondary | ICD-10-CM | POA: Diagnosis not present

## 2017-11-23 DIAGNOSIS — M4316 Spondylolisthesis, lumbar region: Secondary | ICD-10-CM | POA: Diagnosis not present

## 2017-11-23 DIAGNOSIS — M47816 Spondylosis without myelopathy or radiculopathy, lumbar region: Secondary | ICD-10-CM | POA: Diagnosis not present

## 2017-11-30 DIAGNOSIS — M545 Low back pain: Secondary | ICD-10-CM | POA: Diagnosis not present

## 2017-11-30 DIAGNOSIS — R262 Difficulty in walking, not elsewhere classified: Secondary | ICD-10-CM | POA: Diagnosis not present

## 2017-11-30 DIAGNOSIS — M4316 Spondylolisthesis, lumbar region: Secondary | ICD-10-CM | POA: Diagnosis not present

## 2017-12-02 DIAGNOSIS — R262 Difficulty in walking, not elsewhere classified: Secondary | ICD-10-CM | POA: Diagnosis not present

## 2017-12-02 DIAGNOSIS — M545 Low back pain: Secondary | ICD-10-CM | POA: Diagnosis not present

## 2017-12-02 DIAGNOSIS — M4316 Spondylolisthesis, lumbar region: Secondary | ICD-10-CM | POA: Diagnosis not present

## 2017-12-08 DIAGNOSIS — M545 Low back pain: Secondary | ICD-10-CM | POA: Diagnosis not present

## 2017-12-08 DIAGNOSIS — M4316 Spondylolisthesis, lumbar region: Secondary | ICD-10-CM | POA: Diagnosis not present

## 2017-12-08 DIAGNOSIS — M1712 Unilateral primary osteoarthritis, left knee: Secondary | ICD-10-CM | POA: Diagnosis not present

## 2017-12-08 DIAGNOSIS — R262 Difficulty in walking, not elsewhere classified: Secondary | ICD-10-CM | POA: Diagnosis not present

## 2017-12-10 DIAGNOSIS — M545 Low back pain: Secondary | ICD-10-CM | POA: Diagnosis not present

## 2017-12-10 DIAGNOSIS — R262 Difficulty in walking, not elsewhere classified: Secondary | ICD-10-CM | POA: Diagnosis not present

## 2017-12-10 DIAGNOSIS — M4316 Spondylolisthesis, lumbar region: Secondary | ICD-10-CM | POA: Diagnosis not present

## 2017-12-14 DIAGNOSIS — I10 Essential (primary) hypertension: Secondary | ICD-10-CM | POA: Diagnosis not present

## 2017-12-14 DIAGNOSIS — E1142 Type 2 diabetes mellitus with diabetic polyneuropathy: Secondary | ICD-10-CM | POA: Diagnosis not present

## 2017-12-15 DIAGNOSIS — M545 Low back pain: Secondary | ICD-10-CM | POA: Diagnosis not present

## 2017-12-15 DIAGNOSIS — M4316 Spondylolisthesis, lumbar region: Secondary | ICD-10-CM | POA: Diagnosis not present

## 2017-12-15 DIAGNOSIS — R262 Difficulty in walking, not elsewhere classified: Secondary | ICD-10-CM | POA: Diagnosis not present

## 2017-12-17 DIAGNOSIS — M4316 Spondylolisthesis, lumbar region: Secondary | ICD-10-CM | POA: Diagnosis not present

## 2017-12-17 DIAGNOSIS — M545 Low back pain: Secondary | ICD-10-CM | POA: Diagnosis not present

## 2017-12-17 DIAGNOSIS — R262 Difficulty in walking, not elsewhere classified: Secondary | ICD-10-CM | POA: Diagnosis not present

## 2017-12-22 DIAGNOSIS — M545 Low back pain: Secondary | ICD-10-CM | POA: Diagnosis not present

## 2017-12-22 DIAGNOSIS — R262 Difficulty in walking, not elsewhere classified: Secondary | ICD-10-CM | POA: Diagnosis not present

## 2017-12-22 DIAGNOSIS — M4316 Spondylolisthesis, lumbar region: Secondary | ICD-10-CM | POA: Diagnosis not present

## 2017-12-23 DIAGNOSIS — Z23 Encounter for immunization: Secondary | ICD-10-CM | POA: Diagnosis not present

## 2017-12-25 DIAGNOSIS — R262 Difficulty in walking, not elsewhere classified: Secondary | ICD-10-CM | POA: Diagnosis not present

## 2017-12-25 DIAGNOSIS — M545 Low back pain: Secondary | ICD-10-CM | POA: Diagnosis not present

## 2017-12-25 DIAGNOSIS — M4316 Spondylolisthesis, lumbar region: Secondary | ICD-10-CM | POA: Diagnosis not present

## 2017-12-28 ENCOUNTER — Other Ambulatory Visit: Payer: Self-pay | Admitting: Physician Assistant

## 2017-12-30 DIAGNOSIS — M545 Low back pain: Secondary | ICD-10-CM | POA: Diagnosis not present

## 2017-12-30 DIAGNOSIS — M4316 Spondylolisthesis, lumbar region: Secondary | ICD-10-CM | POA: Diagnosis not present

## 2017-12-30 DIAGNOSIS — R262 Difficulty in walking, not elsewhere classified: Secondary | ICD-10-CM | POA: Diagnosis not present

## 2018-01-01 DIAGNOSIS — M545 Low back pain: Secondary | ICD-10-CM | POA: Diagnosis not present

## 2018-01-01 DIAGNOSIS — M4316 Spondylolisthesis, lumbar region: Secondary | ICD-10-CM | POA: Diagnosis not present

## 2018-01-01 DIAGNOSIS — R262 Difficulty in walking, not elsewhere classified: Secondary | ICD-10-CM | POA: Diagnosis not present

## 2018-01-05 DIAGNOSIS — M4316 Spondylolisthesis, lumbar region: Secondary | ICD-10-CM | POA: Diagnosis not present

## 2018-01-05 DIAGNOSIS — R262 Difficulty in walking, not elsewhere classified: Secondary | ICD-10-CM | POA: Diagnosis not present

## 2018-01-05 DIAGNOSIS — M545 Low back pain: Secondary | ICD-10-CM | POA: Diagnosis not present

## 2018-01-07 DIAGNOSIS — M545 Low back pain: Secondary | ICD-10-CM | POA: Diagnosis not present

## 2018-01-07 DIAGNOSIS — M4316 Spondylolisthesis, lumbar region: Secondary | ICD-10-CM | POA: Diagnosis not present

## 2018-01-07 DIAGNOSIS — R262 Difficulty in walking, not elsewhere classified: Secondary | ICD-10-CM | POA: Diagnosis not present

## 2018-01-08 DIAGNOSIS — M47816 Spondylosis without myelopathy or radiculopathy, lumbar region: Secondary | ICD-10-CM | POA: Diagnosis not present

## 2018-01-08 DIAGNOSIS — M4316 Spondylolisthesis, lumbar region: Secondary | ICD-10-CM | POA: Diagnosis not present

## 2018-01-08 DIAGNOSIS — E1142 Type 2 diabetes mellitus with diabetic polyneuropathy: Secondary | ICD-10-CM | POA: Diagnosis not present

## 2018-01-08 DIAGNOSIS — M48062 Spinal stenosis, lumbar region with neurogenic claudication: Secondary | ICD-10-CM | POA: Diagnosis not present

## 2018-01-08 DIAGNOSIS — M4726 Other spondylosis with radiculopathy, lumbar region: Secondary | ICD-10-CM | POA: Diagnosis not present

## 2018-01-08 DIAGNOSIS — I10 Essential (primary) hypertension: Secondary | ICD-10-CM | POA: Diagnosis not present

## 2018-01-13 DIAGNOSIS — M545 Low back pain: Secondary | ICD-10-CM | POA: Diagnosis not present

## 2018-01-13 DIAGNOSIS — E1165 Type 2 diabetes mellitus with hyperglycemia: Secondary | ICD-10-CM | POA: Diagnosis not present

## 2018-01-13 DIAGNOSIS — I10 Essential (primary) hypertension: Secondary | ICD-10-CM | POA: Diagnosis not present

## 2018-01-13 DIAGNOSIS — M5441 Lumbago with sciatica, right side: Secondary | ICD-10-CM | POA: Diagnosis not present

## 2018-01-24 ENCOUNTER — Other Ambulatory Visit: Payer: Self-pay | Admitting: Cardiology

## 2018-02-09 DIAGNOSIS — E1142 Type 2 diabetes mellitus with diabetic polyneuropathy: Secondary | ICD-10-CM | POA: Diagnosis not present

## 2018-02-09 DIAGNOSIS — I10 Essential (primary) hypertension: Secondary | ICD-10-CM | POA: Diagnosis not present

## 2018-02-09 DIAGNOSIS — M545 Low back pain: Secondary | ICD-10-CM | POA: Diagnosis not present

## 2018-03-09 DIAGNOSIS — M545 Low back pain: Secondary | ICD-10-CM | POA: Diagnosis not present

## 2018-03-09 DIAGNOSIS — I10 Essential (primary) hypertension: Secondary | ICD-10-CM | POA: Diagnosis not present

## 2018-03-09 DIAGNOSIS — E1142 Type 2 diabetes mellitus with diabetic polyneuropathy: Secondary | ICD-10-CM | POA: Diagnosis not present

## 2018-03-23 DIAGNOSIS — Z961 Presence of intraocular lens: Secondary | ICD-10-CM | POA: Diagnosis not present

## 2018-03-23 DIAGNOSIS — H35372 Puckering of macula, left eye: Secondary | ICD-10-CM | POA: Diagnosis not present

## 2018-03-23 DIAGNOSIS — E113293 Type 2 diabetes mellitus with mild nonproliferative diabetic retinopathy without macular edema, bilateral: Secondary | ICD-10-CM | POA: Diagnosis not present

## 2018-03-23 DIAGNOSIS — H53001 Unspecified amblyopia, right eye: Secondary | ICD-10-CM | POA: Diagnosis not present

## 2018-03-23 DIAGNOSIS — H2701 Aphakia, right eye: Secondary | ICD-10-CM | POA: Diagnosis not present

## 2018-03-23 DIAGNOSIS — Z794 Long term (current) use of insulin: Secondary | ICD-10-CM | POA: Diagnosis not present

## 2018-04-12 ENCOUNTER — Other Ambulatory Visit: Payer: Self-pay | Admitting: Cardiology

## 2018-04-12 MED ORDER — FUROSEMIDE 40 MG PO TABS: ORAL_TABLET | ORAL | 1 refills | Status: DC

## 2018-04-12 NOTE — Telephone Encounter (Signed)
°*  STAT* If patient is at the pharmacy, call can be transferred to refill team.   1. Which medications need to be refilled?  furosemide (LASIX) 40 MG tablet   2. Which pharmacy/location (including street and city if local pharmacy) is medication to be sent to?[place]  3. Do they need a 30 day or 90 day supply?

## 2018-04-15 DIAGNOSIS — I10 Essential (primary) hypertension: Secondary | ICD-10-CM | POA: Diagnosis not present

## 2018-04-15 DIAGNOSIS — M545 Low back pain: Secondary | ICD-10-CM | POA: Diagnosis not present

## 2018-04-15 DIAGNOSIS — E1142 Type 2 diabetes mellitus with diabetic polyneuropathy: Secondary | ICD-10-CM | POA: Diagnosis not present

## 2018-04-21 DIAGNOSIS — I10 Essential (primary) hypertension: Secondary | ICD-10-CM | POA: Diagnosis not present

## 2018-04-21 DIAGNOSIS — M545 Low back pain: Secondary | ICD-10-CM | POA: Diagnosis not present

## 2018-04-21 DIAGNOSIS — K210 Gastro-esophageal reflux disease with esophagitis: Secondary | ICD-10-CM | POA: Diagnosis not present

## 2018-04-21 DIAGNOSIS — E1121 Type 2 diabetes mellitus with diabetic nephropathy: Secondary | ICD-10-CM | POA: Diagnosis not present

## 2018-05-03 DIAGNOSIS — M48061 Spinal stenosis, lumbar region without neurogenic claudication: Secondary | ICD-10-CM | POA: Diagnosis not present

## 2018-05-03 DIAGNOSIS — M545 Low back pain: Secondary | ICD-10-CM | POA: Diagnosis not present

## 2018-05-03 DIAGNOSIS — M5126 Other intervertebral disc displacement, lumbar region: Secondary | ICD-10-CM | POA: Diagnosis not present

## 2018-05-03 DIAGNOSIS — M5416 Radiculopathy, lumbar region: Secondary | ICD-10-CM | POA: Diagnosis not present

## 2018-05-10 ENCOUNTER — Ambulatory Visit (INDEPENDENT_AMBULATORY_CARE_PROVIDER_SITE_OTHER): Payer: Medicare Other | Admitting: Cardiology

## 2018-05-10 ENCOUNTER — Encounter: Payer: Self-pay | Admitting: Cardiology

## 2018-05-10 VITALS — BP 149/64 | HR 61 | Ht 68.0 in | Wt 193.8 lb

## 2018-05-10 DIAGNOSIS — E782 Mixed hyperlipidemia: Secondary | ICD-10-CM

## 2018-05-10 DIAGNOSIS — I10 Essential (primary) hypertension: Secondary | ICD-10-CM

## 2018-05-10 DIAGNOSIS — I2510 Atherosclerotic heart disease of native coronary artery without angina pectoris: Secondary | ICD-10-CM

## 2018-05-10 MED ORDER — CLOPIDOGREL BISULFATE 75 MG PO TABS: 75.0000 mg | ORAL_TABLET | Freq: Every day | ORAL | 3 refills | Status: AC

## 2018-05-10 MED ORDER — OMEPRAZOLE 20 MG PO CPDR: 20.0000 mg | DELAYED_RELEASE_CAPSULE | Freq: Every day | ORAL | 11 refills | Status: DC

## 2018-05-10 MED ORDER — ATORVASTATIN CALCIUM 40 MG PO TABS: 40.0000 mg | ORAL_TABLET | Freq: Every day | ORAL | 2 refills | Status: DC

## 2018-05-10 NOTE — Progress Notes (Signed)
Clinical Summary Erik Logan is a 85 y.o.male seen today for follow up of the following medical problems.  1. Lightheadness/dizziness - participating in cardiac rehab, just finished - swelling has improved. Mild dizziness at times but much improved off hydralzine and after lowering norvasc.   - no recent symptoms.    2. CAD - admitted with NSTEMI 05/2017 - see cath report below. S/p PTCA of mid PDA, DES to distal RCA, DES to prox RCA with plans for medical therapy of residual disease 05/2017 echo LVEF 55-60%, no WMAs, grade I diastolic dysfunction - due to ongoing SOB, we changed his brillinta to plavix last visit.   - mild SOB with high levels of exertion, fairly stable.  - compliant with meds  3. HTN - developed severe LE edema after starting hydralzine. Noted also around that time norvasc was increased from 5 to 10mg  daily. -hydralazine was discontinued.  - compliant with meds  .  4. Hyperlipidemia - reports atorva 80mg  caused muscle pains. He is taking atorva 40mg  daily a few days a week on his own and tolerating.    5. CKD III    SH: wife recent admission to [facility] with volume overload.    Past Medical History:  Diagnosis Date  . Chronic renal insufficiency, stage 2 (mild)   . Coronary artery disease   . History of duodenal ulcer 1970s  . Hyperlipidemia   . Hypertension   . NSTEMI (non-ST elevated myocardial infarction) ([place]) 06/01/2017  . Type II diabetes mellitus ([facility])      Allergies  Allergen Reactions  . Beef-Derived Products Hives    Red meat  . Sulfa Antibiotics Swelling     Current Outpatient Medications  Medication Sig Dispense Refill  . amLODipine (NORVASC) 5 MG tablet Take 1 tablet (5 mg total) by mouth daily. 90 tablet 1  . aspirin EC 81 MG tablet Take 1 tablet (81 mg total) by mouth daily.    Marland Kitchen atorvastatin (LIPITOR) 80 MG tablet Take 1 tablet (80 mg total) by mouth daily at 6 PM. 30 tablet 6  . clopidogrel  (PLAVIX) 75 MG tablet Take 75 mg by mouth daily.    . clopidogrel (PLAVIX) 75 MG tablet Take 1 tablet (75 mg total) by mouth daily. 90 tablet 3  . furosemide (LASIX) 40 MG tablet TAKE 20 MG DAILY MAY TAKE 40 MG AS NEEDED FOR SWELLING 135 tablet 1  . insulin lispro (HUMALOG) 100 UNIT/ML injection Inject 5 Units into the skin See admin instructions. Per Sliding Scale    . isosorbide mononitrate (IMDUR) 30 MG 24 hr tablet TAKE 1 TABLET BY MOUTH ONCE DAILY 90 tablet 1  . metoprolol tartrate (LOPRESSOR) 25 MG tablet Take 0.5 tablets (12.5 mg total) by mouth daily. 30 tablet 6  . nitroGLYCERIN (NITROSTAT) 0.4 MG SL tablet Place 1 tablet (0.4 mg total) under the tongue every 5 (five) minutes x 3 doses as needed for chest pain. 25 tablet 12  . Polyethyl Glycol-Propyl Glycol (SYSTANE OP) Apply 2 drops to eye daily as needed (Dry eyes).    Tyler Aas FLEXTOUCH 100 UNIT/ML SOPN FlexTouch Pen Inject 25 Units into the skin every morning.      No current facility-administered medications for this visit.      Past Surgical History:  Procedure Laterality Date  . CATARACT EXTRACTION W/ INTRAOCULAR LENS IMPLANT Left   . CORONARY ANGIOPLASTY WITH STENT PLACEMENT  06/02/2017  . CORONARY BALLOON ANGIOPLASTY N/A 06/02/2017   Procedure: CORONARY BALLOON  ANGIOPLASTY;  Surgeon: Troy Sine, MD;  Location: [facility];  Service: Cardiovascular;  Laterality: N/A;  . CORONARY STENT INTERVENTION N/A 06/02/2017   Procedure: CORONARY STENT INTERVENTION;  Surgeon: Troy Sine, MD;  Location: [place] [facility];  Service: Cardiovascular;  Laterality: N/A;  . EYE SURGERY Right 1950s   "S/P injury in ~ 1942 which ; put a hole in the retina; can't see out of it; cataract developed; surgery to remove cataract in 1950  . LEFT HEART CATH AND CORONARY ANGIOGRAPHY N/A 06/02/2017   Procedure: LEFT HEART CATH AND CORONARY ANGIOGRAPHY;  Surgeon: Troy Sine, MD;  Location: [place] [facility];  Service: Cardiovascular;   Laterality: N/A;  . TONSILLECTOMY  1930s     Allergies  Allergen Reactions  . Beef-Derived Products Hives    Red meat  . Sulfa Antibiotics Swelling      Family History  Problem Relation Age of Onset  . Diabetes Mother   . Hypertension Mother   . Pulmonary disease Father   . Pulmonary disease Sister   . Healthy Sister      Social History Erik Logan reports that he has quit smoking. His smoking use included cigarettes. He has a 32.00 pack-year smoking history. He has never used smokeless tobacco. Erik Logan reports no history of alcohol use.   Review of Systems CONSTITUTIONAL: No weight loss, fever, chills, weakness or fatigue.  HEENT: Eyes: No visual loss, blurred vision, double vision or yellow sclerae.No hearing loss, sneezing, congestion, runny nose or sore throat.  SKIN: No rash or itching.  CARDIOVASCULAR: per hpi RESPIRATORY: No shortness of breath, cough or sputum.  GASTROINTESTINAL: No anorexia, nausea, vomiting or diarrhea. No abdominal pain or blood.  GENITOURINARY: No burning on urination, no polyuria NEUROLOGICAL: No headache, dizziness, syncope, paralysis, ataxia, numbness or tingling in the extremities. No change in bowel or bladder control.  MUSCULOSKELETAL: +knee pain LYMPHATICS: No enlarged nodes. No history of splenectomy.  PSYCHIATRIC: No history of depression or anxiety.  ENDOCRINOLOGIC: No reports of sweating, cold or heat intolerance. No polyuria or polydipsia.  Marland Kitchen   Physical Examination Vitals:   05/10/18 1432  BP: (!) 149/64  Pulse: 61  SpO2: 97%   Vitals:   05/10/18 1432  Weight: 193 lb 12.8 oz (87.9 kg)  Height: 5\' 8"  (1.727 m)    Gen: resting comfortably, no acute distress HEENT: no scleral icterus, pupils equal round and reactive, no palptable cervical adenopathy,  CV: RRR, no m/r/g, no jvd Resp: Clear to auscultation bilaterally GI: abdomen is soft, non-tender, non-distended, normal bowel sounds, no hepatosplenomegaly MSK:  extremities are warm, no edema.  Skin: warm, no rash Neuro:  no focal deficits Psych: appropriate affect   Diagnostic Studies  05/2017 cath  Prox RCA lesion is 95% stenosed.  Prox RCA to Mid RCA lesion is 50% stenosed.  Ramus lesion is 50% stenosed.  Ost 2nd Diag to 2nd Diag lesion is 100% stenosed.  RPDA lesion is 90% stenosed.  Balloon angioplasty was performed using a BALLOON SAPPHIRE 2.0X12.  Post intervention, there is a 0% residual stenosis.  Dist RCA lesion is 90% stenosed.  Post intervention, there is a 0% residual stenosis.  A stent was successfully placed.  A stent was successfully placed.  Post intervention, there is a 0% residual stenosis.  Post intervention, there is a 0% residual stenosis.  Ost 1st Diag to 1st Diag lesion is 85% stenosed.  Multi-vessel CAD with 85% long stenosis in the first diagonal  Ramandeep Logan of the LAD, initial total occlusion of the second diagonal vessel with later mild opening to 99%, 50% stenosis in the ramus intermediate vessel; normal left circumflex vessel; and diffusely diseased dominant RCA with 95% proximal stenosis followed by diffuse irregularity of 50%, 90% focal stenosis distally beyond the acute margin prior to the PDA, and focal 90% mid PDA stenosis.  LVEDP 17 mm.  Successful multi lesion PCI to the RCA with PTCA of the mid PDA 90% stenosis reduced to 0%; PCI/DES stenting of the 90% distal RCA stenosis treated with a 2.515 mm Resolute stent post dilated to 2.7 mm with the stenosis being reduced to 0%, and insertion of a 3.026 mm Resolute stent to cover the 95 and 50% proximal to mid RCA stenoses, post dilated up to 3.2 mm with the stenoses being reduced to 0%.  RECOMMENDATION: Echo Doppler evaluation for LV function assessment. If the patient develops recurrent chest pain symptomatology, consider possible PCI to the second diagonal vessel. Otherwise, consider medical therapy with beta blocker, nitrates. Recommended  dual antiplatelet therapy for minimum of 1 year. Aggressive lipid intervention.  05/2017 echo Study Conclusions  - Left ventricle: The cavity size was normal. Wall thickness was increased in a pattern of mild LVH. Systolic function was normal. The estimated ejection fraction was in the range of 55% to 60%. Wall motion was normal; there were no regional wall motion abnormalities. Doppler parameters are consistent with abnormal left ventricular relaxation (grade 1 diastolic dysfunction). - Mitral valve: Mildly calcified annulus. Valve area by pressure half-time: 1.85 cm^2.   Assessment and Plan   1. CAD - not on ACE-I due to poor renal function - he will stop plavix next month, completing a year of DAPT - no symptoms.   2.HTN - accepting higher bp due to advanced age and significant dizziness with attempts of more aggressive control  3. Hyperlipidemia - muscle aches on atorva 80mg  daily, we will try 40mg  daily.    F/u 6 months  Arnoldo Lenis, M.D.

## 2018-05-10 NOTE — Patient Instructions (Addendum)
Medication Instructions:   Your physician has recommended you make the following change in your medication:   You may stop your plavix on 06/03/2018  You may get over the counter omeprazole 20 mg taking one by mouth daily  Continue all other medications the same  Labwork:  NONE  Testing/Procedures:  NONE  Follow-Up:  Your physician recommends that you schedule a follow-up appointment in: 6 months. You will receive a reminder letter in the mail in about 4 months reminding you to call and schedule your appointment. If you don't receive this letter, please contact our office.  Any Other Special Instructions Will Be Listed Below (If Applicable).  If you need a refill on your cardiac medications before your next appointment, please call your pharmacy.

## 2018-05-24 ENCOUNTER — Other Ambulatory Visit: Payer: Self-pay | Admitting: Cardiology

## 2018-06-21 ENCOUNTER — Other Ambulatory Visit: Payer: Self-pay | Admitting: Cardiology

## 2018-06-28 ENCOUNTER — Other Ambulatory Visit: Payer: Self-pay | Admitting: Cardiology

## 2018-07-02 DIAGNOSIS — K210 Gastro-esophageal reflux disease with esophagitis: Secondary | ICD-10-CM | POA: Diagnosis not present

## 2018-07-02 DIAGNOSIS — E1121 Type 2 diabetes mellitus with diabetic nephropathy: Secondary | ICD-10-CM | POA: Diagnosis not present

## 2018-07-02 DIAGNOSIS — M545 Low back pain: Secondary | ICD-10-CM | POA: Diagnosis not present

## 2018-07-02 DIAGNOSIS — I10 Essential (primary) hypertension: Secondary | ICD-10-CM | POA: Diagnosis not present

## 2018-07-09 ENCOUNTER — Other Ambulatory Visit: Payer: Self-pay | Admitting: Physician Assistant

## 2018-07-13 DIAGNOSIS — M1712 Unilateral primary osteoarthritis, left knee: Secondary | ICD-10-CM | POA: Diagnosis not present

## 2018-07-20 DIAGNOSIS — M1712 Unilateral primary osteoarthritis, left knee: Secondary | ICD-10-CM | POA: Diagnosis not present

## 2018-07-27 DIAGNOSIS — K210 Gastro-esophageal reflux disease with esophagitis: Secondary | ICD-10-CM | POA: Diagnosis not present

## 2018-07-27 DIAGNOSIS — M1712 Unilateral primary osteoarthritis, left knee: Secondary | ICD-10-CM | POA: Diagnosis not present

## 2018-07-27 DIAGNOSIS — E1129 Type 2 diabetes mellitus with other diabetic kidney complication: Secondary | ICD-10-CM | POA: Diagnosis not present

## 2018-07-27 DIAGNOSIS — I10 Essential (primary) hypertension: Secondary | ICD-10-CM | POA: Diagnosis not present

## 2018-07-27 DIAGNOSIS — E1121 Type 2 diabetes mellitus with diabetic nephropathy: Secondary | ICD-10-CM | POA: Diagnosis not present

## 2018-07-27 DIAGNOSIS — M545 Low back pain: Secondary | ICD-10-CM | POA: Diagnosis not present

## 2018-07-27 DIAGNOSIS — N183 Chronic kidney disease, stage 3 (moderate): Secondary | ICD-10-CM | POA: Diagnosis not present

## 2018-07-29 DIAGNOSIS — K210 Gastro-esophageal reflux disease with esophagitis: Secondary | ICD-10-CM | POA: Diagnosis not present

## 2018-07-29 DIAGNOSIS — N183 Chronic kidney disease, stage 3 (moderate): Secondary | ICD-10-CM | POA: Diagnosis not present

## 2018-07-29 DIAGNOSIS — I10 Essential (primary) hypertension: Secondary | ICD-10-CM | POA: Diagnosis not present

## 2018-07-29 DIAGNOSIS — M545 Low back pain: Secondary | ICD-10-CM | POA: Diagnosis not present

## 2018-07-29 DIAGNOSIS — E1121 Type 2 diabetes mellitus with diabetic nephropathy: Secondary | ICD-10-CM | POA: Diagnosis not present

## 2018-08-24 DIAGNOSIS — K210 Gastro-esophageal reflux disease with esophagitis: Secondary | ICD-10-CM | POA: Diagnosis not present

## 2018-08-24 DIAGNOSIS — N183 Chronic kidney disease, stage 3 (moderate): Secondary | ICD-10-CM | POA: Diagnosis not present

## 2018-08-24 DIAGNOSIS — E1121 Type 2 diabetes mellitus with diabetic nephropathy: Secondary | ICD-10-CM | POA: Diagnosis not present

## 2018-08-24 DIAGNOSIS — M545 Low back pain: Secondary | ICD-10-CM | POA: Diagnosis not present

## 2018-08-24 DIAGNOSIS — I10 Essential (primary) hypertension: Secondary | ICD-10-CM | POA: Diagnosis not present

## 2018-08-25 DIAGNOSIS — L219 Seborrheic dermatitis, unspecified: Secondary | ICD-10-CM | POA: Diagnosis not present

## 2018-08-25 DIAGNOSIS — D0462 Carcinoma in situ of skin of left upper limb, including shoulder: Secondary | ICD-10-CM | POA: Diagnosis not present

## 2018-08-25 DIAGNOSIS — D485 Neoplasm of uncertain behavior of skin: Secondary | ICD-10-CM | POA: Diagnosis not present

## 2018-08-25 DIAGNOSIS — L570 Actinic keratosis: Secondary | ICD-10-CM | POA: Diagnosis not present

## 2018-08-25 DIAGNOSIS — D229 Melanocytic nevi, unspecified: Secondary | ICD-10-CM | POA: Diagnosis not present

## 2018-10-06 DIAGNOSIS — K30 Functional dyspepsia: Secondary | ICD-10-CM | POA: Diagnosis not present

## 2018-10-06 DIAGNOSIS — R079 Chest pain, unspecified: Secondary | ICD-10-CM | POA: Diagnosis not present

## 2018-10-06 DIAGNOSIS — N181 Chronic kidney disease, stage 1: Secondary | ICD-10-CM | POA: Diagnosis not present

## 2018-10-06 DIAGNOSIS — Z87891 Personal history of nicotine dependence: Secondary | ICD-10-CM | POA: Diagnosis not present

## 2018-10-06 DIAGNOSIS — Z79899 Other long term (current) drug therapy: Secondary | ICD-10-CM | POA: Diagnosis not present

## 2018-10-06 DIAGNOSIS — R0789 Other chest pain: Secondary | ICD-10-CM | POA: Diagnosis not present

## 2018-10-06 DIAGNOSIS — Z955 Presence of coronary angioplasty implant and graft: Secondary | ICD-10-CM | POA: Diagnosis not present

## 2018-10-06 DIAGNOSIS — E1122 Type 2 diabetes mellitus with diabetic chronic kidney disease: Secondary | ICD-10-CM | POA: Diagnosis not present

## 2018-10-06 DIAGNOSIS — I5030 Unspecified diastolic (congestive) heart failure: Secondary | ICD-10-CM | POA: Diagnosis not present

## 2018-10-06 DIAGNOSIS — Z833 Family history of diabetes mellitus: Secondary | ICD-10-CM | POA: Diagnosis not present

## 2018-10-06 DIAGNOSIS — R0602 Shortness of breath: Secondary | ICD-10-CM | POA: Diagnosis not present

## 2018-10-06 DIAGNOSIS — Z882 Allergy status to sulfonamides status: Secondary | ICD-10-CM | POA: Diagnosis not present

## 2018-10-06 DIAGNOSIS — Z836 Family history of other diseases of the respiratory system: Secondary | ICD-10-CM | POA: Diagnosis not present

## 2018-10-06 DIAGNOSIS — I2510 Atherosclerotic heart disease of native coronary artery without angina pectoris: Secondary | ICD-10-CM | POA: Diagnosis not present

## 2018-10-06 DIAGNOSIS — I130 Hypertensive heart and chronic kidney disease with heart failure and stage 1 through stage 4 chronic kidney disease, or unspecified chronic kidney disease: Secondary | ICD-10-CM | POA: Diagnosis not present

## 2018-10-06 DIAGNOSIS — E785 Hyperlipidemia, unspecified: Secondary | ICD-10-CM | POA: Diagnosis not present

## 2018-10-06 DIAGNOSIS — I252 Old myocardial infarction: Secondary | ICD-10-CM | POA: Diagnosis not present

## 2018-10-06 DIAGNOSIS — Z7982 Long term (current) use of aspirin: Secondary | ICD-10-CM | POA: Diagnosis not present

## 2018-10-06 DIAGNOSIS — Z794 Long term (current) use of insulin: Secondary | ICD-10-CM | POA: Diagnosis not present

## 2018-10-07 DIAGNOSIS — R0789 Other chest pain: Secondary | ICD-10-CM | POA: Diagnosis not present

## 2018-10-07 DIAGNOSIS — I130 Hypertensive heart and chronic kidney disease with heart failure and stage 1 through stage 4 chronic kidney disease, or unspecified chronic kidney disease: Secondary | ICD-10-CM | POA: Diagnosis not present

## 2018-10-07 DIAGNOSIS — E1122 Type 2 diabetes mellitus with diabetic chronic kidney disease: Secondary | ICD-10-CM | POA: Diagnosis not present

## 2018-10-07 DIAGNOSIS — N181 Chronic kidney disease, stage 1: Secondary | ICD-10-CM | POA: Diagnosis not present

## 2018-10-07 DIAGNOSIS — I5030 Unspecified diastolic (congestive) heart failure: Secondary | ICD-10-CM | POA: Diagnosis not present

## 2018-10-08 DIAGNOSIS — I10 Essential (primary) hypertension: Secondary | ICD-10-CM | POA: Diagnosis not present

## 2018-10-08 DIAGNOSIS — N183 Chronic kidney disease, stage 3 (moderate): Secondary | ICD-10-CM | POA: Diagnosis not present

## 2018-10-08 DIAGNOSIS — K210 Gastro-esophageal reflux disease with esophagitis: Secondary | ICD-10-CM | POA: Diagnosis not present

## 2018-10-08 DIAGNOSIS — M545 Low back pain: Secondary | ICD-10-CM | POA: Diagnosis not present

## 2018-10-08 DIAGNOSIS — E1121 Type 2 diabetes mellitus with diabetic nephropathy: Secondary | ICD-10-CM | POA: Diagnosis not present

## 2018-10-18 DIAGNOSIS — Z0000 Encounter for general adult medical examination without abnormal findings: Secondary | ICD-10-CM | POA: Diagnosis not present

## 2018-10-18 DIAGNOSIS — K210 Gastro-esophageal reflux disease with esophagitis: Secondary | ICD-10-CM | POA: Diagnosis not present

## 2018-10-18 DIAGNOSIS — Z1389 Encounter for screening for other disorder: Secondary | ICD-10-CM | POA: Diagnosis not present

## 2018-10-22 DIAGNOSIS — A048 Other specified bacterial intestinal infections: Secondary | ICD-10-CM | POA: Diagnosis not present

## 2018-11-04 DIAGNOSIS — D0462 Carcinoma in situ of skin of left upper limb, including shoulder: Secondary | ICD-10-CM | POA: Diagnosis not present

## 2018-11-05 DIAGNOSIS — E1142 Type 2 diabetes mellitus with diabetic polyneuropathy: Secondary | ICD-10-CM | POA: Diagnosis not present

## 2018-11-05 DIAGNOSIS — I10 Essential (primary) hypertension: Secondary | ICD-10-CM | POA: Diagnosis not present

## 2018-11-05 DIAGNOSIS — K210 Gastro-esophageal reflux disease with esophagitis: Secondary | ICD-10-CM | POA: Diagnosis not present

## 2018-11-09 DIAGNOSIS — M25775 Osteophyte, left foot: Secondary | ICD-10-CM | POA: Diagnosis not present

## 2018-11-09 DIAGNOSIS — M7661 Achilles tendinitis, right leg: Secondary | ICD-10-CM | POA: Diagnosis not present

## 2018-11-09 DIAGNOSIS — M25774 Osteophyte, right foot: Secondary | ICD-10-CM | POA: Diagnosis not present

## 2018-11-09 DIAGNOSIS — M25579 Pain in unspecified ankle and joints of unspecified foot: Secondary | ICD-10-CM | POA: Diagnosis not present

## 2018-11-09 DIAGNOSIS — M7662 Achilles tendinitis, left leg: Secondary | ICD-10-CM | POA: Diagnosis not present

## 2018-11-09 DIAGNOSIS — M1990 Unspecified osteoarthritis, unspecified site: Secondary | ICD-10-CM | POA: Diagnosis not present

## 2018-11-09 DIAGNOSIS — M79672 Pain in left foot: Secondary | ICD-10-CM | POA: Diagnosis not present

## 2018-11-09 DIAGNOSIS — M79671 Pain in right foot: Secondary | ICD-10-CM | POA: Diagnosis not present

## 2018-11-16 ENCOUNTER — Encounter: Payer: Self-pay | Admitting: Cardiology

## 2018-11-16 ENCOUNTER — Other Ambulatory Visit: Payer: Self-pay

## 2018-11-16 ENCOUNTER — Ambulatory Visit (INDEPENDENT_AMBULATORY_CARE_PROVIDER_SITE_OTHER): Payer: Medicare Other | Admitting: Cardiology

## 2018-11-16 ENCOUNTER — Encounter: Payer: Self-pay | Admitting: *Deleted

## 2018-11-16 VITALS — BP 186/76 | HR 62 | Ht 69.0 in | Wt 194.2 lb

## 2018-11-16 DIAGNOSIS — I10 Essential (primary) hypertension: Secondary | ICD-10-CM | POA: Diagnosis not present

## 2018-11-16 DIAGNOSIS — I2510 Atherosclerotic heart disease of native coronary artery without angina pectoris: Secondary | ICD-10-CM

## 2018-11-16 DIAGNOSIS — E782 Mixed hyperlipidemia: Secondary | ICD-10-CM | POA: Diagnosis not present

## 2018-11-16 NOTE — Patient Instructions (Signed)
Your physician wants you to follow-up in: [place] will receive a reminder letter in the mail two months in advance. If you don't receive a letter, please call our office to schedule the follow-up appointment.  Your physician recommends that you continue on your current medications as directed. Please refer to the Current Medication list given to you today.  PLEASE CALL us Friday WITH YOUR BLOOD PRESSURE READINGS  Thank you for choosing [facility]!!

## 2018-11-16 NOTE — Progress Notes (Signed)
Clinical Summary Erik Logan is a 85 y.o.male seen today for follow up of the following medical problems.  1. Lightheadness/dizziness - participating in cardiac rehab, just finished -swelling has improved. Mild dizziness at timesbut much improved off hydralzine and after lowering norvasc.  - denies any recent issues   2. CAD - admitted with NSTEMI 05/2017 - see cath report below. S/p PTCA of mid PDA, DES to distal RCA, DES to prox RCA with plans for medical therapy of residual disease 05/2017 echo LVEF 55-60%, no WMAs, grade I diastolic dysfunction - due to ongoing SOB, we changed his brillinta to plavix last visit.    - no recent chest pain. Chronic SOB unchanged, also limited by chronic knee and back pain.     3. HTN - developed severe LE edema after starting hydralzine. Noted also around that time norvasc was increased from 5 to 10mg  daily. -hydralazine was discontinued and norvasc lowered to 5mg , edema resolved.   - has not taken med yet today - does not check bp regularly at home but does have a cuff.   4. Hyperlipidemia - reports atorva 80mg  caused muscle pains. He is taking atorva 40mg  daily a few days a week on his own and tolerating.   -compliant with statin, labs followed by pcp   5. CKD III - followed by pcp      Past Medical History:  Diagnosis Date   Chronic renal insufficiency, stage 2 (mild)    Coronary artery disease    History of duodenal ulcer 1970s   Hyperlipidemia    Hypertension    NSTEMI (non-ST elevated myocardial infarction) ([place]) 06/01/2017   SCC (squamous cell carcinoma) Well Diff 10/10/2015   Left Wrist (Cx3,5FU)   Squamous cell carcinoma in situ (SCCIS) 10/10/2015   Left Arm (Cx3,5FU)   Squamous cell carcinoma in situ (SCCIS) x3 08/25/2018   Left Hand Medial, Left Hand Lateral and Left Arm   Type II diabetes mellitus (HCC)      Allergies  Allergen Reactions   Beef-Derived Products Hives    Red meat   Sulfa Antibiotics Swelling     Current Outpatient Medications  Medication Sig Dispense Refill   amLODipine (NORVASC) 5 MG tablet TAKE 1 TABLET BY MOUTH ONCE DAILY (DOSE  DECREASE  08/25/17) 90 tablet 1   aspirin EC 81 MG tablet Take 1 tablet (81 mg total) by mouth daily.     atorvastatin (LIPITOR) 40 MG tablet Take 1 tablet (40 mg total) by mouth daily. 90 tablet 2   furosemide (LASIX) 40 MG tablet TAKE 20 MG DAILY MAY TAKE 40 MG AS NEEDED FOR SWELLING 135 tablet 1   insulin lispro (HUMALOG) 100 UNIT/ML injection Inject 5 Units into the skin See admin instructions. Per Sliding Scale     isosorbide mononitrate (IMDUR) 30 MG 24 hr tablet Take 1 tablet by mouth once daily 90 tablet 0   metoprolol tartrate (LOPRESSOR) 25 MG tablet Take 0.5 tablets (12.5 mg total) by mouth daily. 30 tablet 6   nitroGLYCERIN (NITROSTAT) 0.4 MG SL tablet DISSOLVE ONE TABLET UNDER THE TONGUE EVERY 5 MINUTES AS NEEDED FOR CHEST PAIN.  DO NOT EXCEED A TOTAL OF 3 DOSES IN 15 MINUTES 25 tablet 3   omeprazole (PRILOSEC) 20 MG capsule Take 1 capsule (20 mg total) by mouth daily. 30 capsule 11   Polyethyl Glycol-Propyl Glycol (SYSTANE OP) Apply 2 drops to eye daily as needed (Dry eyes).     TOUJEO SOLOSTAR 300 UNIT/ML SOPN  Inject 30 Units into the skin every morning.      No current facility-administered medications for this visit.      Past Surgical History:  Procedure Laterality Date   CATARACT EXTRACTION W/ INTRAOCULAR LENS IMPLANT Left    CORONARY ANGIOPLASTY WITH STENT PLACEMENT  06/02/2017   CORONARY BALLOON ANGIOPLASTY N/A 06/02/2017   Procedure: CORONARY BALLOON ANGIOPLASTY;  Surgeon: Troy Sine, MD;  Location: [facility];  Service: Cardiovascular;  Laterality: N/A;   CORONARY STENT INTERVENTION N/A 06/02/2017   Procedure: CORONARY STENT INTERVENTION;  Surgeon: Troy Sine, MD;  Location: [facility];  Service: Cardiovascular;  Laterality: N/A;   EYE SURGERY  Right 1950s   "S/P injury in ~ 1942 which ; put a hole in the retina; can't see out of it; cataract developed; surgery to remove cataract in [place] N/A 06/02/2017   Procedure: LEFT HEART CATH AND CORONARY ANGIOGRAPHY;  Surgeon: Troy Sine, MD;  Location: [facility];  Service: Cardiovascular;  Laterality: N/A;   TONSILLECTOMY  1930s     Allergies  Allergen Reactions   Beef-Derived Products Hives    Red meat   Sulfa Antibiotics Swelling      Family History  Problem Relation Age of Onset   Diabetes Mother    Hypertension Mother    Pulmonary disease Father    Pulmonary disease Sister    Healthy Sister      Social History Erik Logan reports that he has quit smoking. His smoking use included cigarettes. He has a 32.00 pack-year smoking history. He has never used smokeless tobacco. Erik Logan reports no history of alcohol use.   Review of Systems CONSTITUTIONAL: No weight loss, fever, chills, weakness or fatigue.  HEENT: Eyes: No visual loss, blurred vision, double vision or yellow sclerae.No hearing loss, sneezing, congestion, runny nose or sore throat.  SKIN: No rash or itching.  CARDIOVASCULAR: per hpi RESPIRATORY: per hpi GASTROINTESTINAL: No anorexia, nausea, vomiting or diarrhea. No abdominal pain or blood.  GENITOURINARY: No burning on urination, no polyuria NEUROLOGICAL: No headache, dizziness, syncope, paralysis, ataxia, numbness or tingling in the extremities. No change in bowel or bladder control.  MUSCULOSKELETAL: No muscle, back pain, joint pain or stiffness.  LYMPHATICS: No enlarged nodes. No history of splenectomy.  PSYCHIATRIC: No history of depression or anxiety.  ENDOCRINOLOGIC: No reports of sweating, cold or heat intolerance. No polyuria or polydipsia.  Erik Logan Kitchen   Physical Examination Today's Vitals   11/16/18 1103  BP: (!) 186/76  Pulse: 62  SpO2: 97%  Weight: 194 lb 3.2 oz (88.1 kg)  Height:  5\' 9"  (1.753 m)   Body mass index is 28.68 kg/m.  Gen: resting comfortably, no acute distress HEENT: no scleral icterus, pupils equal round and reactive, no palptable cervical adenopathy,  CV: RRR, no m/r/g, no jvd Resp: Clear to auscultation bilaterally GI: abdomen is soft, non-tender, non-distended, normal bowel sounds, no hepatosplenomegaly MSK: extremities are warm, no edema.  Skin: warm, no rash Neuro:  no focal deficits Psych: appropriate affect   Diagnostic Studies  05/2017 cath  Prox RCA lesion is 95% stenosed.  Prox RCA to Mid RCA lesion is 50% stenosed.  Ramus lesion is 50% stenosed.  Ost 2nd Diag to 2nd Diag lesion is 100% stenosed.  RPDA lesion is 90% stenosed.  Balloon angioplasty was performed using a BALLOON SAPPHIRE 2.0X12.  Post intervention, there is a 0% residual stenosis.  Dist RCA lesion  is 90% stenosed.  Post intervention, there is a 0% residual stenosis.  A stent was successfully placed.  A stent was successfully placed.  Post intervention, there is a 0% residual stenosis.  Post intervention, there is a 0% residual stenosis.  Ost 1st Diag to 1st Diag lesion is 85% stenosed.  Multi-vessel CAD with 85% long stenosis in the first diagonal Atlantis Delong of the LAD, initial total occlusion of the second diagonal vessel with later mild opening to 99%, 50% stenosis in the ramus intermediate vessel; normal left circumflex vessel; and diffusely diseased dominant RCA with 95% proximal stenosis followed by diffuse irregularity of 50%, 90% focal stenosis distally beyond the acute margin prior to the PDA, and focal 90% mid PDA stenosis.  LVEDP 17 mm.  Successful multi lesion PCI to the RCA with PTCA of the mid PDA 90% stenosis reduced to 0%; PCI/DES stenting of the 90% distal RCA stenosis treated with a 2.515 mm Resolute stent post dilated to 2.7 mm with the stenosis being reduced to 0%, and insertion of a 3.026 mm Resolute stent to cover the 95 and 50%  proximal to mid RCA stenoses, post dilated up to 3.2 mm with the stenoses being reduced to 0%.  RECOMMENDATION: Echo Doppler evaluation for LV function assessment. If the patient develops recurrent chest pain symptomatology, consider possible PCI to the second diagonal vessel. Otherwise, consider medical therapy with beta blocker, nitrates. Recommended dual antiplatelet therapy for minimum of 1 year. Aggressive lipid intervention.  05/2017 echo Study Conclusions  - Left ventricle: The cavity size was normal. Wall thickness was increased in a pattern of mild LVH. Systolic function was normal. The estimated ejection fraction was in the range of 55% to 60%. Wall motion was normal; there were no regional wall motion abnormalities. Doppler parameters are consistent with abnormal left ventricular relaxation (grade 1 diastolic dysfunction). - Mitral valve: Mildly calcified annulus. Valve area by pressure half-time: 1.85 cm^2.   Assessment and Plan   1. CAD - not on ACE-I due to poor renal function - no symptoms, continue current meds - EKG today shows SR first degree av block, no acute ischemic symptoms  2.HTN - accepting higher bp due to advanced age and significant dizziness with attempts of more aggressive control - has not taken meds yet today, bp elevated. He will call with bp on Friday. Given prior issues with dizziness, advanced age, and also limitations on bp agents given CKD and prior edema on hydral and higher norvasc dose, would accept higher bp's SBPs in 150s  3. Hyperlipidemia -continue statin, request labs from pcp  F/u 6 months  Arnoldo Lenis, M.D.,

## 2018-11-19 ENCOUNTER — Telehealth: Payer: Self-pay | Admitting: Cardiology

## 2018-11-19 NOTE — Telephone Encounter (Signed)
Bp's are reasonable, I would not make any changes at this time   Zandra Abts MD

## 2018-11-19 NOTE — Telephone Encounter (Signed)
11/16/2018 157/74   P 60   11/17/2018 142/68   P67 11/18/2018 142/64   P66 11/19/2018   144/63   P55

## 2018-11-23 NOTE — Telephone Encounter (Signed)
Returning call from last week

## 2018-11-23 NOTE — Telephone Encounter (Signed)
Pt aware.

## 2018-12-09 ENCOUNTER — Other Ambulatory Visit: Payer: Self-pay | Admitting: Cardiology

## 2018-12-14 DIAGNOSIS — K210 Gastro-esophageal reflux disease with esophagitis: Secondary | ICD-10-CM | POA: Diagnosis not present

## 2018-12-14 DIAGNOSIS — E1142 Type 2 diabetes mellitus with diabetic polyneuropathy: Secondary | ICD-10-CM | POA: Diagnosis not present

## 2018-12-14 DIAGNOSIS — I10 Essential (primary) hypertension: Secondary | ICD-10-CM | POA: Diagnosis not present

## 2018-12-24 ENCOUNTER — Other Ambulatory Visit: Payer: Self-pay | Admitting: Cardiology

## 2018-12-24 DIAGNOSIS — Z23 Encounter for immunization: Secondary | ICD-10-CM | POA: Diagnosis not present

## 2019-01-10 DIAGNOSIS — I10 Essential (primary) hypertension: Secondary | ICD-10-CM | POA: Diagnosis not present

## 2019-01-10 DIAGNOSIS — E1142 Type 2 diabetes mellitus with diabetic polyneuropathy: Secondary | ICD-10-CM | POA: Diagnosis not present

## 2019-01-25 DIAGNOSIS — E1142 Type 2 diabetes mellitus with diabetic polyneuropathy: Secondary | ICD-10-CM | POA: Diagnosis not present

## 2019-01-25 DIAGNOSIS — E1140 Type 2 diabetes mellitus with diabetic neuropathy, unspecified: Secondary | ICD-10-CM | POA: Diagnosis not present

## 2019-01-25 DIAGNOSIS — B351 Tinea unguium: Secondary | ICD-10-CM | POA: Diagnosis not present

## 2019-01-25 DIAGNOSIS — L609 Nail disorder, unspecified: Secondary | ICD-10-CM | POA: Diagnosis not present

## 2019-02-02 DIAGNOSIS — E1121 Type 2 diabetes mellitus with diabetic nephropathy: Secondary | ICD-10-CM | POA: Diagnosis not present

## 2019-02-02 DIAGNOSIS — I10 Essential (primary) hypertension: Secondary | ICD-10-CM | POA: Diagnosis not present

## 2019-02-02 DIAGNOSIS — K219 Gastro-esophageal reflux disease without esophagitis: Secondary | ICD-10-CM | POA: Diagnosis not present

## 2019-02-02 DIAGNOSIS — K2100 Gastro-esophageal reflux disease with esophagitis, without bleeding: Secondary | ICD-10-CM | POA: Diagnosis not present

## 2019-02-02 DIAGNOSIS — L570 Actinic keratosis: Secondary | ICD-10-CM | POA: Diagnosis not present

## 2019-02-02 DIAGNOSIS — L820 Inflamed seborrheic keratosis: Secondary | ICD-10-CM | POA: Diagnosis not present

## 2019-02-02 DIAGNOSIS — M1710 Unilateral primary osteoarthritis, unspecified knee: Secondary | ICD-10-CM | POA: Diagnosis not present

## 2019-02-07 DIAGNOSIS — E1121 Type 2 diabetes mellitus with diabetic nephropathy: Secondary | ICD-10-CM | POA: Diagnosis not present

## 2019-02-07 DIAGNOSIS — I10 Essential (primary) hypertension: Secondary | ICD-10-CM | POA: Diagnosis not present

## 2019-03-15 ENCOUNTER — Telehealth: Payer: Self-pay | Admitting: Cardiology

## 2019-03-15 NOTE — Telephone Encounter (Signed)
Virtual Visit Pre-Appointment Phone Call  "(Name), I am calling you today to discuss your upcoming appointment. We are currently trying to limit exposure to the virus that causes COVID-19 by seeing patients at home rather than in the office."  1. "What is the BEST phone number to call the day of the visit?" - [phone] 2.  3. "Do you have or have access to (through a family member/friend) a smartphone with video capability that we can use for your visit?" a. If yes - list this number in appt notes as "cell" (if different from BEST phone #) and list the appointment type as a VIDEO visit in appointment notes b. If no - list the appointment type as a PHONE visit in appointment notes  4. Confirm consent - "In the setting of the current Covid19 crisis, you are scheduled for a (phone or video) visit with your provider on (date) at (time).  Just as we do with many in-office visits, in order for you to participate in this visit, we must obtain consent.  If you'd like, I can send this to your mychart (if signed up) or email for you to review.  Otherwise, I can obtain your verbal consent now.  All virtual visits are billed to your insurance company just like a normal visit would be.  By agreeing to a virtual visit, we'd like you to understand that the technology does not allow for your provider to perform an examination, and thus may limit your provider's ability to fully assess your condition. If your provider identifies any concerns that need to be evaluated in person, we will make arrangements to do so.  Finally, though the technology is pretty good, we cannot assure that it will always work on either your or our end, and in the setting of a video visit, we may have to convert it to a phone-only visit.  In either situation, we cannot ensure that we have a secure connection.  Are you willing to proceed?" STAFF: Did the patient verbally acknowledge consent to telehealth visit? Document YES/NO here:  YES 5.   6. Advise patient to be prepared - "Two hours prior to your appointment, go ahead and check your blood pressure, pulse, oxygen saturation, and your weight (if you have the equipment to check those) and write them all down. When your visit starts, your provider will ask you for this information. If you have an Apple Watch or Kardia device, please plan to have heart rate information ready on the day of your appointment. Please have a pen and paper handy nearby the day of the visit as well."  7. Give patient instructions for MyChart download to smartphone OR [email or web address] as below if video visit (depending on what platform provider is using)  8. Inform patient they will receive a phone call 15 minutes prior to their appointment time (may be from unknown caller ID) so they should be prepared to answer    TELEPHONE CALL NOTE  YAEL ANGERER has been deemed a candidate for a follow-up tele-health visit to limit community exposure during the Covid-19 pandemic. I spoke with the patient via phone to ensure availability of phone/video source, confirm preferred email & phone number, and discuss instructions and expectations.  I reminded ROSHAD HACK to be prepared with any vital sign and/or heart rhythm information that could potentially be obtained via home monitoring, at the time of his visit. I reminded ROYALE SWAMY to expect a phone call prior to his  visit.  Chanda Busing 03/15/2019 4:59 PM   INSTRUCTIONS FOR DOWNLOADING THE MYCHART APP TO SMARTPHONE  - The patient must first make sure to have activated MyChart and know their login information - If Apple, go to [employer] and type in MyChart in the search bar and download the app. If Android, ask patient to go to [employer] and type in [facility] in the search bar and download the app. The app is free but as with any other app downloads, their phone may require them to verify saved payment information or Apple/Android password.   - The patient will need to then log into the app with their MyChart username and password, and select [facility] as their healthcare provider to link the account. When it is time for your visit, go to the MyChart app, find appointments, and click Begin Video Visit. Be sure to Select Allow for your device to access the Microphone and Camera for your visit. You will then be connected, and your provider will be with you shortly.  **If they have any issues connecting, or need assistance please contact MyChart service desk [phone])**  **If using a computer, in order to ensure the best quality for their visit they will need to use either of the following Internet Browsers: [employer], or Google Chrome**  IF USING DOXIMITY or DOXY.ME - The patient will receive a link just prior to their visit by text.     FULL LENGTH CONSENT FOR TELE-HEALTH VISIT   I hereby voluntarily request, consent and authorize [facility] and its employed or contracted physicians, physician assistants, nurse practitioners or other licensed health care professionals (the Practitioner), to provide me with telemedicine health care services (the "Services") as deemed necessary by the treating Practitioner. I acknowledge and consent to receive the Services by the Practitioner via telemedicine. I understand that the telemedicine visit will involve communicating with the Practitioner through live audiovisual communication technology and the disclosure of certain medical information by electronic transmission. I acknowledge that I have been given the opportunity to request an in-person assessment or other available alternative prior to the telemedicine visit and am voluntarily participating in the telemedicine visit.  I understand that I have the right to withhold or withdraw my consent to the use of telemedicine in the course of my care at any time, without affecting my right to future care or treatment, and that  the Practitioner or I may terminate the telemedicine visit at any time. I understand that I have the right to inspect all information obtained and/or recorded in the course of the telemedicine visit and may receive copies of available information for a reasonable fee.  I understand that some of the potential risks of receiving the Services via telemedicine include:  Marland Kitchen Delay or interruption in medical evaluation due to technological equipment failure or disruption; . Information transmitted may not be sufficient (e.g. poor resolution of images) to allow for appropriate medical decision making by the Practitioner; and/or  . In rare instances, security protocols could fail, causing a breach of personal health information.  Furthermore, I acknowledge that it is my responsibility to provide information about my medical history, conditions and care that is complete and accurate to the best of my ability. I acknowledge that Practitioner's advice, recommendations, and/or decision may be based on factors not within their control, such as incomplete or inaccurate data provided by me or distortions of diagnostic images or specimens that may result from electronic transmissions. I understand  that the practice of medicine is not an exact science and that Practitioner makes no warranties or guarantees regarding treatment outcomes. I acknowledge that I will receive a copy of this consent concurrently upon execution via email to the email address I last provided but may also request a printed copy by calling the office of [facility].    I understand that my insurance will be billed for this visit.   I have read or had this consent read to me. . I understand the contents of this consent, which adequately explains the benefits and risks of the Services being provided via telemedicine.  . I have been provided ample opportunity to ask questions regarding this consent and the Services and have had my questions answered to  my satisfaction. . I give my informed consent for the services to be provided through the use of telemedicine in my medical care  By participating in this telemedicine visit I agree to the above.

## 2019-03-17 ENCOUNTER — Encounter: Payer: Self-pay | Admitting: Cardiology

## 2019-03-22 ENCOUNTER — Telehealth (INDEPENDENT_AMBULATORY_CARE_PROVIDER_SITE_OTHER): Payer: Medicare Other | Admitting: Cardiology

## 2019-03-22 ENCOUNTER — Encounter: Payer: Self-pay | Admitting: Cardiology

## 2019-03-22 VITALS — BP 155/77 | HR 63 | Ht 68.5 in | Wt 190.4 lb

## 2019-03-22 DIAGNOSIS — I5032 Chronic diastolic (congestive) heart failure: Secondary | ICD-10-CM | POA: Diagnosis not present

## 2019-03-22 DIAGNOSIS — I2510 Atherosclerotic heart disease of native coronary artery without angina pectoris: Secondary | ICD-10-CM

## 2019-03-22 NOTE — Patient Instructions (Signed)

## 2019-03-22 NOTE — Progress Notes (Signed)
Virtual Visit via Telephone Note   This visit type was conducted due to national recommendations for restrictions regarding the COVID-19 Pandemic (e.g. social distancing) in an effort to limit this patient's exposure and mitigate transmission in our community.  Due to his co-morbid illnesses, this patient is at least at moderate risk for complications without adequate follow up.  This format is felt to be most appropriate for this patient at this time.  The patient did not have access to video technology/had technical difficulties with video requiring transitioning to audio format only (telephone).  All issues noted in this document were discussed and addressed.  No physical exam could be performed with this format.  Please refer to the patient's chart for his  consent to telehealth for [facility].   Date:  03/22/2019   ID:  Erik Logan, DOB Mar 05, 1929, MRN [id number]  Patient Location: Home Provider Location: Office  PCP:  Neale Burly, MD  Cardiologist:  Carlyle Dolly, MD  Electrophysiologist:  None   Evaluation Performed:  Follow-Up Visit  Chief Complaint:  Follow up visit  History of Present Illness:    Erik Logan is a 86 y.o. male seen today for follow up of the following medical problems.Focused visit after recent hospital admission for SOB.     1. CAD - admitted with NSTEMI 05/2017 - see cath report below. S/p PTCA of mid PDA, DES to distal RCA, DES to prox RCA with plans for medical therapy of residual disease 05/2017 echo LVEF 55-60%, no WMAs, grade I diastolic dysfunction    - 03/16/19 [facility] admission with SOB. Hospital records mention some chest pain as well however patient denies - trop neg x 3. BNP 1237. CXR no acute process, chronic COPD.   - SOB for a few days, progressing. He denies any chest pain - had some LE edema that was progressing. Weight was 196 lbs on admission, down to 193 lbs on discharge. Then down to 189 lbs which is his  baseline.  - increased sodium intake  - was given IV lasix in ER, with improved symptoms      The patient does not have symptoms concerning for COVID-19 infection (fever, chills, cough, or new shortness of breath).    Past Medical History:  Diagnosis Date  . Chronic renal insufficiency, stage 2 (mild)   . Coronary artery disease   . History of duodenal ulcer 1970s  . Hyperlipidemia   . Hypertension   . NSTEMI (non-ST elevated myocardial infarction) ([place]) 06/01/2017  . SCC (squamous cell carcinoma) Well Diff 10/10/2015   Left Wrist (Cx3,5FU)  . Squamous cell carcinoma in situ (SCCIS) 10/10/2015   Left Arm (Cx3,5FU)  . Squamous cell carcinoma in situ (SCCIS) x3 08/25/2018   Left Hand Medial, Left Hand Lateral and Left Arm  . Type II diabetes mellitus ([place])    Past Surgical History:  Procedure Laterality Date  . CATARACT EXTRACTION W/ INTRAOCULAR LENS IMPLANT Left   . CORONARY ANGIOPLASTY WITH STENT PLACEMENT  06/02/2017  . CORONARY BALLOON ANGIOPLASTY N/A 06/02/2017   Procedure: CORONARY BALLOON ANGIOPLASTY;  Surgeon: Troy Sine, MD;  Location: [place] [facility];  Service: Cardiovascular;  Laterality: N/A;  . CORONARY STENT INTERVENTION N/A 06/02/2017   Procedure: CORONARY STENT INTERVENTION;  Surgeon: Troy Sine, MD;  Location: [place] [facility];  Service: Cardiovascular;  Laterality: N/A;  . EYE SURGERY Right 1950s   "S/P injury in ~ 1942 which ; put a hole in the retina;  can't see out of it; cataract developed; surgery to remove cataract in 1950  . LEFT HEART CATH AND CORONARY ANGIOGRAPHY N/A 06/02/2017   Procedure: LEFT HEART CATH AND CORONARY ANGIOGRAPHY;  Surgeon: Troy Sine, MD;  Location: [place] [facility];  Service: Cardiovascular;  Laterality: N/A;  . TONSILLECTOMY  1930s     No outpatient medications have been marked as taking for the 03/22/19 encounter (Appointment) with Arnoldo Lenis, MD.     Allergies:   Beef-derived products and Sulfa  antibiotics   Social History   Tobacco Use  . Smoking status: Former Smoker    Packs/day: 1.00    Years: 32.00    Pack years: 32.00    Types: Cigarettes  . Smokeless tobacco: Never Used  . Tobacco comment: Quit around age 41  Substance Use Topics  . Alcohol use: No  . Drug use: No     Family Hx: The patient's family history includes Diabetes in his mother; Healthy in his sister; Hypertension in his mother; Pulmonary disease in his father and sister.  ROS:   Please see the history of present illness.     All other systems reviewed and are negative.   Prior CV studies:   The following studies were reviewed today:  05/2017 cath  Prox RCA lesion is 95% stenosed.  Prox RCA to Mid RCA lesion is 50% stenosed.  Ramus lesion is 50% stenosed.  Ost 2nd Diag to 2nd Diag lesion is 100% stenosed.  RPDA lesion is 90% stenosed.  Balloon angioplasty was performed using a BALLOON SAPPHIRE 2.0X12.  Post intervention, there is a 0% residual stenosis.  Dist RCA lesion is 90% stenosed.  Post intervention, there is a 0% residual stenosis.  A stent was successfully placed.  A stent was successfully placed.  Post intervention, there is a 0% residual stenosis.  Post intervention, there is a 0% residual stenosis.  Ost 1st Diag to 1st Diag lesion is 85% stenosed.  Multi-vessel CAD with 85% long stenosis in the first diagonal Evangelia Whitaker of the LAD, initial total occlusion of the second diagonal vessel with later mild opening to 99%, 50% stenosis in the ramus intermediate vessel; normal left circumflex vessel; and diffusely diseased dominant RCA with 95% proximal stenosis followed by diffuse irregularity of 50%, 90% focal stenosis distally beyond the acute margin prior to the PDA, and focal 90% mid PDA stenosis.  LVEDP 17 mm.  Successful multi lesion PCI to the RCA with PTCA of the mid PDA 90% stenosis reduced to 0%; PCI/DES stenting of the 90% distal RCA stenosis treated with a 2.515  mm Resolute stent post dilated to 2.7 mm with the stenosis being reduced to 0%, and insertion of a 3.026 mm Resolute stent to cover the 95 and 50% proximal to mid RCA stenoses, post dilated up to 3.2 mm with the stenoses being reduced to 0%.  RECOMMENDATION: Echo Doppler evaluation for LV function assessment. If the patient develops recurrent chest pain symptomatology, consider possible PCI to the second diagonal vessel. Otherwise, consider medical therapy with beta blocker, nitrates. Recommended dual antiplatelet therapy for minimum of 1 year. Aggressive lipid intervention.  05/2017 echo Study Conclusions  - Left ventricle: The cavity size was normal. Wall thickness was increased in a pattern of mild LVH. Systolic function was normal. The estimated ejection fraction was in the range of 55% to 60%. Wall motion was normal; there were no regional wall motion abnormalities. Doppler parameters are consistent with abnormal left ventricular relaxation (grade 1  diastolic dysfunction). - Mitral valve: Mildly calcified annulus. Valve area by pressure half-time: 1.85 cm^2.  Labs/Other Tests and Data Reviewed:    EKG:  No ECG reviewed.  Recent Labs: No results found for requested labs within last 8760 hours.   Recent Lipid Panel Lab Results  Component Value Date/Time   CHOL 171 06/02/2017 05:42 AM   TRIG 106 06/02/2017 05:42 AM   HDL 34 (L) 06/02/2017 05:42 AM   CHOLHDL 5.0 06/02/2017 05:42 AM   LDLCALC 116 (H) 06/02/2017 05:42 AM    Wt Readings from Last 3 Encounters:  11/16/18 194 lb 3.2 oz (88.1 kg)  05/10/18 193 lb 12.8 oz (87.9 kg)  10/26/17 191 lb 6.4 oz (86.8 kg)     Objective:    Vital Signs:   Today's Vitals   03/22/19 0946  BP: (!) 155/77  Pulse: 63  Weight: 190 lb 6.4 oz (86.4 kg)  Height: 5' 8.5" (1.74 m)   Body mass index is 28.53 kg/m. Normal affect. Normal speech pattern and tone. Comfortable, no apparent distress. No audible signs of SOB or  wheezing.   ASSESSMENT & PLAN:     1. CAD - not on ACE-I due to poor renal function -recent admission with SOB, he denies chest pain. Trops negative, no signs of ACS - continue current meds  2.Chronic diastolic HF - recent admission with weight gain, edema, SOB. Symptoms resolved with diuresis during admission - baseline weight around 190lbs, continue lasix 20mg  daily. Counseled can take additional 20mg  as needed for weight of 193lbs or more.     COVID-19 Education: The signs and symptoms of COVID-19 were discussed with the patient and how to seek care for testing (follow up with PCP or arrange E-visit).  The importance of social distancing was discussed today.  Time:   Today, I have spent 20 minutes with the patient with telehealth technology discussing the above problems.     Medication Adjustments/Labs and Tests Ordered: Current medicines are reviewed at length with the patient today.  Concerns regarding medicines are outlined above.   Tests Ordered: No orders of the defined types were placed in this encounter.   Medication Changes: No orders of the defined types were placed in this encounter.   Follow Up:  In Person in 6 month(s)  Signed, Carlyle Dolly, MD  03/22/2019 8:45 AM    [place]

## 2019-03-28 DIAGNOSIS — I5021 Acute systolic (congestive) heart failure: Secondary | ICD-10-CM | POA: Diagnosis not present

## 2019-03-28 DIAGNOSIS — I209 Angina pectoris, unspecified: Secondary | ICD-10-CM | POA: Diagnosis not present

## 2019-04-05 DIAGNOSIS — E1142 Type 2 diabetes mellitus with diabetic polyneuropathy: Secondary | ICD-10-CM | POA: Diagnosis not present

## 2019-04-05 DIAGNOSIS — L609 Nail disorder, unspecified: Secondary | ICD-10-CM | POA: Diagnosis not present

## 2019-04-05 DIAGNOSIS — B351 Tinea unguium: Secondary | ICD-10-CM | POA: Diagnosis not present

## 2019-04-05 DIAGNOSIS — E1140 Type 2 diabetes mellitus with diabetic neuropathy, unspecified: Secondary | ICD-10-CM | POA: Diagnosis not present

## 2019-04-21 DIAGNOSIS — I10 Essential (primary) hypertension: Secondary | ICD-10-CM | POA: Diagnosis not present

## 2019-04-21 DIAGNOSIS — E119 Type 2 diabetes mellitus without complications: Secondary | ICD-10-CM | POA: Diagnosis not present

## 2019-05-06 DIAGNOSIS — H35372 Puckering of macula, left eye: Secondary | ICD-10-CM | POA: Diagnosis not present

## 2019-05-06 DIAGNOSIS — Z961 Presence of intraocular lens: Secondary | ICD-10-CM | POA: Diagnosis not present

## 2019-05-06 DIAGNOSIS — H2701 Aphakia, right eye: Secondary | ICD-10-CM | POA: Diagnosis not present

## 2019-05-06 DIAGNOSIS — E113293 Type 2 diabetes mellitus with mild nonproliferative diabetic retinopathy without macular edema, bilateral: Secondary | ICD-10-CM | POA: Diagnosis not present

## 2019-05-06 DIAGNOSIS — H53001 Unspecified amblyopia, right eye: Secondary | ICD-10-CM | POA: Diagnosis not present

## 2019-05-10 DIAGNOSIS — K2100 Gastro-esophageal reflux disease with esophagitis, without bleeding: Secondary | ICD-10-CM | POA: Diagnosis not present

## 2019-05-10 DIAGNOSIS — M1710 Unilateral primary osteoarthritis, unspecified knee: Secondary | ICD-10-CM | POA: Diagnosis not present

## 2019-05-10 DIAGNOSIS — I5021 Acute systolic (congestive) heart failure: Secondary | ICD-10-CM | POA: Diagnosis not present

## 2019-05-10 DIAGNOSIS — Z0000 Encounter for general adult medical examination without abnormal findings: Secondary | ICD-10-CM | POA: Diagnosis not present

## 2019-05-10 DIAGNOSIS — I5032 Chronic diastolic (congestive) heart failure: Secondary | ICD-10-CM | POA: Diagnosis not present

## 2019-06-06 ENCOUNTER — Other Ambulatory Visit: Payer: Self-pay | Admitting: Cardiology

## 2019-06-21 DIAGNOSIS — E1142 Type 2 diabetes mellitus with diabetic polyneuropathy: Secondary | ICD-10-CM | POA: Diagnosis not present

## 2019-06-21 DIAGNOSIS — E1140 Type 2 diabetes mellitus with diabetic neuropathy, unspecified: Secondary | ICD-10-CM | POA: Diagnosis not present

## 2019-06-21 DIAGNOSIS — B351 Tinea unguium: Secondary | ICD-10-CM | POA: Diagnosis not present

## 2019-06-21 DIAGNOSIS — L609 Nail disorder, unspecified: Secondary | ICD-10-CM | POA: Diagnosis not present

## 2019-06-22 DIAGNOSIS — H02423 Myogenic ptosis of bilateral eyelids: Secondary | ICD-10-CM | POA: Diagnosis not present

## 2019-06-22 DIAGNOSIS — H02413 Mechanical ptosis of bilateral eyelids: Secondary | ICD-10-CM | POA: Diagnosis not present

## 2019-06-22 DIAGNOSIS — H02834 Dermatochalasis of left upper eyelid: Secondary | ICD-10-CM | POA: Diagnosis not present

## 2019-06-22 DIAGNOSIS — H02831 Dermatochalasis of right upper eyelid: Secondary | ICD-10-CM | POA: Diagnosis not present

## 2019-06-22 DIAGNOSIS — H0279 Other degenerative disorders of eyelid and periocular area: Secondary | ICD-10-CM | POA: Diagnosis not present

## 2019-07-04 NOTE — Progress Notes (Addendum)
Cardiology Office Note  Date: 08/01/2019   ID: Erik Logan, DOB 04/08/1928, MRN [id number]  PCP:  Neale Burly, MD  Cardiologist:  Carlyle Dolly, MD Electrophysiologist:  None   Chief Complaint: F/U CAD, Dyspnea, HTN, HLD,  Chronic HFpEF.   History of Present Illness: Erik Logan is a 86 y.o. male with a history of  CAD(NSTEMI 2019 S/P PCI mid PDA, DES to distal RCA, DES prox RCA. Medical therapy of residual disease), Dyspnea, HTN, HLD,  Chronic HFpEF.  Last encounter with Dr Harl Bowie via telemedicine on 03/22/2019. He had a recent ER and admit for SOB and CP 03/16/2019 [facility]. Wt. decreased from 196 to 189 at discharge. He ruled out for ACS with neg Trop x 3, BNP 1237, neg CXR. Has hx of COPD. He was given IV Lasix with improved symptoms.  Patient is here for follow-up.  States he is having increasing shortness of breath when performing activities such as taking the trash out.  States this is new for him.  He was recently treated in the emergency room in December for diastolic heart failure.  He is now taking Lasix 40 mg daily.  States he has gained some weight since discharge from the emergency room.  He states his home weight was 192 this morning. He has not been weighing himself at home daily.  States he has been drinking a fair amount of fluid and apparently was not told he needed to restrict his fluid intake.  He denies any anginal symptoms, PND, orthopnea.  His blood pressure is elevated on arrival.  States he has not taken his blood pressure medications this morning.  States usually his systolic blood pressure runs in the 150s to 155 range at home.  He has a history of smoking for approximately 32 years. He has not seen a pulmonologist in the past.   Past Medical History:  Diagnosis Date  . Chronic renal insufficiency, stage 2 (mild)   . Coronary artery disease   . History of duodenal ulcer 1970s  . Hyperlipidemia   . Hypertension   . NSTEMI (non-ST elevated  myocardial infarction) ([place]) 06/01/2017  . SCC (squamous cell carcinoma) Well Diff 10/10/2015   Left Wrist (Cx3,5FU)  . Squamous cell carcinoma in situ (SCCIS) 10/10/2015   Left Arm (Cx3,5FU)  . Squamous cell carcinoma in situ (SCCIS) x3 08/25/2018   Left Hand Medial, Left Hand Lateral and Left Arm  . Type II diabetes mellitus ([place])     Past Surgical History:  Procedure Laterality Date  . CATARACT EXTRACTION W/ INTRAOCULAR LENS IMPLANT Left   . CORONARY ANGIOPLASTY WITH STENT PLACEMENT  06/02/2017  . CORONARY BALLOON ANGIOPLASTY N/A 06/02/2017   Procedure: CORONARY BALLOON ANGIOPLASTY;  Surgeon: Troy Sine, MD;  Location: [place] [facility];  Service: Cardiovascular;  Laterality: N/A;  . CORONARY STENT INTERVENTION N/A 06/02/2017   Procedure: CORONARY STENT INTERVENTION;  Surgeon: Troy Sine, MD;  Location: [place] [facility];  Service: Cardiovascular;  Laterality: N/A;  . EYE SURGERY Right 1950s   "S/P injury in ~ 1942 which ; put a hole in the retina; can't see out of it; cataract developed; surgery to remove cataract in 1950  . LEFT HEART CATH AND CORONARY ANGIOGRAPHY N/A 06/02/2017   Procedure: LEFT HEART CATH AND CORONARY ANGIOGRAPHY;  Surgeon: Troy Sine, MD;  Location: [place] [facility];  Service: Cardiovascular;  Laterality: N/A;  . TONSILLECTOMY  1930s    No current facility-administered medications  for this visit.   No current outpatient medications on file.   Facility-Administered Medications Ordered in Other Visits  Medication Dose Route Frequency Provider Last Rate Last Admin  . 0.9 %  sodium chloride infusion  250 mL Intravenous PRN Opyd, Ilene Qua, MD      . acetaminophen (TYLENOL) tablet 650 mg  650 mg Oral Q6H Patrecia Pour, MD   650 mg at 08/01/19 1428  . albuterol (PROVENTIL) (2.5 MG/3ML) 0.083% nebulizer solution 2.5 mg  2.5 mg Nebulization Q2H PRN Chesley Mires, MD      . amLODipine (NORVASC) tablet 5 mg  5 mg Oral Daily Opyd, Ilene Qua, MD   5 mg at  08/01/19 0830  . antiseptic oral rinse (BIOTENE) solution 15 mL  15 mL Mouth Rinse PRN Jennelle Human B, NP      . aspirin EC tablet 81 mg  81 mg Oral Daily Opyd, Ilene Qua, MD   81 mg at 08/01/19 0829  . atorvastatin (LIPITOR) tablet 40 mg  40 mg Oral Daily Opyd, Ilene Qua, MD   40 mg at 08/01/19 0830  . budesonide (PULMICORT) nebulizer solution 0.5 mg  0.5 mg Nebulization BID Chesley Mires, MD   0.5 mg at 08/01/19 0932  . ceFEPIme (MAXIPIME) 2 g in sodium chloride 0.9 % 100 mL IVPB  2 g Intravenous Q24H Patrecia Pour, MD   Stopped at 08/01/19 1500  . guaiFENesin-dextromethorphan (ROBITUSSIN DM) 100-10 MG/5ML syrup 5 mL  5 mL Oral Q4H PRN Arrien, Jimmy Picket, MD   5 mL at 07/31/19 1047  . heparin injection 5,000 Units  5,000 Units Subcutaneous Q8H Opyd, Ilene Qua, MD   5,000 Units at 08/01/19 1428  . HYDROmorphone (DILAUDID) injection 0.25 mg  0.25 mg Intravenous Q2H PRN Patrecia Pour, MD   0.25 mg at 08/01/19 0815  . insulin aspart (novoLOG) injection 0-20 Units  0-20 Units Subcutaneous TID WC Patrecia Pour, MD   1 Units at 08/01/19 1215  . insulin aspart (novoLOG) injection 0-5 Units  0-5 Units Subcutaneous QHS Patrecia Pour, MD   5 Units at 07/30/19 2142  . insulin aspart (novoLOG) injection 5 Units  5 Units Subcutaneous TID WC Vance Gather B, MD      . insulin detemir (LEVEMIR) injection 20 Units  20 Units Subcutaneous BID Patrecia Pour, MD   20 Units at 08/01/19 0827  . ipratropium-albuterol (DUONEB) 0.5-2.5 (3) MG/3ML nebulizer solution 3 mL  3 mL Nebulization BID Collene Gobble, MD      . isosorbide mononitrate (IMDUR) 24 hr tablet 30 mg  30 mg Oral Daily Opyd, Ilene Qua, MD   30 mg at 08/01/19 0829  . lip balm (CARMEX) ointment   Topical PRN Jennelle Human B, NP      . LORazepam (ATIVAN) injection 0.5 mg  0.5 mg Intravenous Q6H PRN Patrecia Pour, MD      . MEDLINE mouth rinse  15 mL Mouth Rinse BID Opyd, Ilene Qua, MD   15 mL at 08/01/19 0833  . methylPREDNISolone sodium succinate  (SOLU-MEDROL) 125 mg/2 mL injection 60 mg  60 mg Intravenous Q6H Chesley Mires, MD   60 mg at 08/01/19 1432  . metoprolol tartrate (LOPRESSOR) tablet 12.5 mg  12.5 mg Oral Daily Opyd, Ilene Qua, MD   12.5 mg at 08/01/19 0829  . morphine 2 MG/ML injection 1 mg  1 mg Intravenous Q4H PRN Arrien, Jimmy Picket, MD   1 mg at 08/01/19 1428  .  ondansetron (ZOFRAN) tablet 4 mg  4 mg Oral Q6H PRN Opyd, Ilene Qua, MD       Or  . ondansetron (ZOFRAN) injection 4 mg  4 mg Intravenous Q6H PRN Opyd, Ilene Qua, MD      . pantoprazole (PROTONIX) EC tablet 40 mg  40 mg Oral Daily Opyd, Ilene Qua, MD   40 mg at 08/01/19 0830  . senna-docusate (Senokot-S) tablet 1 tablet  1 tablet Oral QHS PRN Opyd, Ilene Qua, MD      . sodium chloride flush (NS) 0.9 % injection 3 mL  3 mL Intravenous Q12H Opyd, Ilene Qua, MD   3 mL at 08/01/19 0818  . sodium chloride flush (NS) 0.9 % injection 3 mL  3 mL Intravenous PRN Opyd, Ilene Qua, MD       Allergies:  Beef-derived products and Sulfa antibiotics   Social History: The patient  reports that he has quit smoking. His smoking use included cigarettes. He has a 32.00 pack-year smoking history. He has never used smokeless tobacco. He reports that he does not drink alcohol or use drugs.   Family History: The patient's family history includes Diabetes in his mother; Healthy in his sister; Hypertension in his mother; Pulmonary disease in his father and sister.   ROS:  Please see the history of present illness. Otherwise, complete review of systems is positive for none.  All other systems are reviewed and negative.   Physical Exam: VS:  BP (!) 188/80   Pulse 81   Ht 5' 8.5" (1.74 m)   Wt 196 lb (88.9 kg)   SpO2 98%   BMI 29.37 kg/m , BMI Body mass index is 29.37 kg/m.  Wt Readings from Last 3 Encounters:  08/01/19 188 lb 0.8 oz (85.3 kg)  07/19/19 192 lb 3.2 oz (87.2 kg)  07/05/19 196 lb (88.9 kg)    General: Patient appears comfortable at rest. Neck: Supple, no elevated  JVP or carotid bruits, no thyromegaly. Lungs: Clear to auscultation, nonlabored breathing at rest. Cardiac: Regular rate and rhythm, no S3 or significant systolic murmur, no pericardial rub. Extremities: No pitting edema, distal pulses 2+. Skin: Warm and dry. Musculoskeletal: No kyphosis. Neuropsychiatric: Alert and oriented x3, affect grossly appropriate.  ECG:  An ECG dated 11/16/2018 was personally reviewed today and demonstrated:  Sinus rhythm first-degree AV block rate of 60  Recent Labwork: 08/07/2019: B Natriuretic Peptide 713.3 08/01/2019: BUN 114; Creatinine, Ser 2.97; Hemoglobin 11.6; Platelets 301; Potassium 5.1; Sodium 137     Component Value Date/Time   CHOL 171 06/02/2017 0542   TRIG 106 06/02/2017 0542   HDL 34 (L) 06/02/2017 0542   CHOLHDL 5.0 06/02/2017 0542   VLDL 21 06/02/2017 0542   LDLCALC 116 (H) 06/02/2017 0542    Other Studies Reviewed Today:  05/2017 cath  Prox RCA lesion is 95% stenosed.  Prox RCA to Mid RCA lesion is 50% stenosed.  Ramus lesion is 50% stenosed.  Ost 2nd Diag to 2nd Diag lesion is 100% stenosed.  RPDA lesion is 90% stenosed.  Balloon angioplasty was performed using a BALLOON SAPPHIRE 2.0X12.  Post intervention, there is a 0% residual stenosis.  Dist RCA lesion is 90% stenosed.  Post intervention, there is a 0% residual stenosis.  A stent was successfully placed.  A stent was successfully placed.  Post intervention, there is a 0% residual stenosis.  Post intervention, there is a 0% residual stenosis.  Ost 1st Diag to 1st Diag lesion is 85% stenosed.  Multi-vessel  CAD with 85% long stenosis in the first diagonal branch of the LAD, initial total occlusion of the second diagonal vessel with later mild opening to 99%, 50% stenosis in the ramus intermediate vessel; normal left circumflex vessel; and diffusely diseased dominant RCA with 95% proximal stenosis followed by diffuse irregularity of 50%, 90% focal stenosis distally  beyond the acute margin prior to the PDA, and focal 90% mid PDA stenosis.  LVEDP 17 mm.  Successful multi lesion PCI to the RCA with PTCA of the mid PDA 90% stenosis reduced to 0%; PCI/DES stenting of the 90% distal RCA stenosis treated with a 2.515 mm Resolute stent post dilated to 2.7 mm with the stenosis being reduced to 0%, and insertion of a 3.026 mm Resolute stent to cover the 95 and 50% proximal to mid RCA stenoses, post dilated up to 3.2 mm with the stenoses being reduced to 0%.  RECOMMENDATION: Echo Doppler evaluation for LV function assessment. If the patient develops recurrent chest pain symptomatology, consider possible PCI to the second diagonal vessel. Otherwise, consider medical therapy with beta blocker, nitrates. Recommended dual antiplatelet therapy for minimum of 1 year. Aggressive lipid intervention.  05/2017 echo Study Conclusions  - Left ventricle: The cavity size was normal. Wall thickness was increased in a pattern of mild LVH. Systolic function was normal. The estimated ejection fraction was in the range of 55% to 60%. Wall motion was normal; there were no regional wall motion abnormalities. Doppler parameters are consistent with abnormal left ventricular relaxation (grade 1 diastolic dysfunction). - Mitral valve: Mildly calcified annulus. Valve area by pressure half-time: 1.85 cm^2.  Assessment and Plan:  1. Coronary artery disease involving native coronary artery of native heart without angina pectoris   2. Chronic diastolic heart failure ([place])   3. Essential hypertension    1. Coronary artery disease involving native coronary artery of native heart without angina pectoris History of CAD with non-STEMI and stent placements x 2 to RCA, POBA to RPDA.  He denies any recent anginal symptoms but does complain of some increased dyspnea on exertion.  He does have a history of COPD likely secondary to smoking.  Offered referral to pulmonology.   Patient wants to defer for now.  Continue aspirin 81 mg daily, Imdur 30 mg daily, metoprolol 12.5 mg p.o. twice daily. NTG SL PRN  2. Chronic diastolic heart failure [facility]) Recent hospital admission for diastolic heart failure.  He lost approximately 19 to 20 pounds during the hospital visit after IV diuresis and discharged to home. States he has gained approximately 4 to 6 pounds since being discharged from the hospital over the last 2 months. Continue Lasix 40 mg daily for LE edema / dyspnea.(Patient states he is now taking a full 40 mg dose of Lasix). Advised he and his son to restrict fluid intake to 60 cc per day, restrict salt intake, weigh very day and report increase of 3 lbs in 24 hours or 5 lbs in 7 days. Requested he call us if this occurs. Patient and son verbalized understanding. If dyspnea persists we may need to do an ischemic work up to assess for progression of CAD.  3. Essential hypertension Blood pressure elevated today on arrival at 188/80.  Patient states he has not taken his blood pressure medications today.  States usually at home systolic blood pressure is 150-155.  Add losartan 25 mg daily.  Take blood pressures daily for the next 2 weeks.  Follow-up in 2 weeks for nurse visit for blood  pressure check and BMET.  Medication Adjustments/Labs and Tests Ordered: Current medicines are reviewed at length with the patient today.  Concerns regarding medicines are outlined above.   Disposition: Follow-up with Dr. Harl Bowie or APP 1 month Signed, Levell July, NP 08/01/2019 8:08 PM    [facility] [address], [place], [place] [address] Phone: [phone]; Fax: [phone]

## 2019-07-05 ENCOUNTER — Other Ambulatory Visit: Payer: Self-pay

## 2019-07-05 ENCOUNTER — Ambulatory Visit (INDEPENDENT_AMBULATORY_CARE_PROVIDER_SITE_OTHER): Payer: Medicare Other | Admitting: Family Medicine

## 2019-07-05 ENCOUNTER — Encounter: Payer: Self-pay | Admitting: *Deleted

## 2019-07-05 ENCOUNTER — Encounter: Payer: Self-pay | Admitting: Family Medicine

## 2019-07-05 VITALS — BP 188/80 | HR 81 | Ht 68.5 in | Wt 196.0 lb

## 2019-07-05 DIAGNOSIS — I2510 Atherosclerotic heart disease of native coronary artery without angina pectoris: Secondary | ICD-10-CM | POA: Diagnosis not present

## 2019-07-05 DIAGNOSIS — I5032 Chronic diastolic (congestive) heart failure: Secondary | ICD-10-CM

## 2019-07-05 DIAGNOSIS — I10 Essential (primary) hypertension: Secondary | ICD-10-CM

## 2019-07-05 MED ORDER — LOSARTAN POTASSIUM 25 MG PO TABS: 25.0000 mg | ORAL_TABLET | Freq: Every day | ORAL | 1 refills | Status: AC

## 2019-07-05 NOTE — Patient Instructions (Addendum)
Medication Instructions:    Your physician has recommended you make the following change in your medication:   Start losartan 25 mg by mouth daily  Continue other medications the same  Labwork:  Your physician recommends that you return for non-fasting lab work in: 2 weeks to check a BMET.   Testing/Procedures:  NONE  Follow-Up:  Your physician recommends that you schedule a follow-up appointment in: 1 month (office)  Your physician recommends that return for a nurse visit in 2 weeks with your home blood pressure readings.  Any Other Special Instructions Will Be Listed Below (If Applicable). Your physician has requested that you regularly monitor and record your blood pressure readings at home daily. Please use the same machine at the same time of day to check your readings and record them to bring to your follow-up visit.  If you need a refill on your cardiac medications before your next appointment, please call your pharmacy.

## 2019-07-12 DIAGNOSIS — J440 Chronic obstructive pulmonary disease with acute lower respiratory infection: Secondary | ICD-10-CM | POA: Diagnosis not present

## 2019-07-19 ENCOUNTER — Other Ambulatory Visit: Payer: Self-pay

## 2019-07-19 ENCOUNTER — Encounter: Payer: Self-pay | Admitting: *Deleted

## 2019-07-19 ENCOUNTER — Ambulatory Visit (INDEPENDENT_AMBULATORY_CARE_PROVIDER_SITE_OTHER): Payer: Medicare Other | Admitting: *Deleted

## 2019-07-19 VITALS — BP 180/76 | HR 64 | Ht 68.5 in | Wt 192.2 lb

## 2019-07-19 DIAGNOSIS — I110 Hypertensive heart disease with heart failure: Secondary | ICD-10-CM | POA: Diagnosis not present

## 2019-07-19 DIAGNOSIS — I5032 Chronic diastolic (congestive) heart failure: Secondary | ICD-10-CM | POA: Diagnosis not present

## 2019-07-19 DIAGNOSIS — I10 Essential (primary) hypertension: Secondary | ICD-10-CM

## 2019-07-19 NOTE — Progress Notes (Signed)
Present for nurse visit to have blood pressure rechecked per recent office visit and starting losartan 25 mg daily. Reports seeing PCP on 07/13/2019 since last visit and was given an steroid shot for SOB and given a prednisone dose pack. Reports blood pressure had normalized after starting losartan but after starting prednisone, his blood pressure elevated again. Reports having SOB. Reports dizziness that he's had for several years that is unchanged. Denies chest pain.

## 2019-07-19 NOTE — Patient Instructions (Signed)
Continue same plan We will contact you after your provider reviews your readings

## 2019-07-20 ENCOUNTER — Telehealth: Payer: Self-pay | Admitting: *Deleted

## 2019-07-20 NOTE — Telephone Encounter (Signed)
Patient informed. Copy sent to PCP °

## 2019-07-20 NOTE — Telephone Encounter (Signed)
-----   Message from Verta Ellen., NP sent at 07/19/2019 11:03 PM EDT ----- Please call the patient let him know his kidney function is about the same as it was the last last time it was checked. However his blood  sugar was significantly elevated at 350. Tell him he needs to follow up with Dr Sherrie Sport for management.

## 2019-07-20 NOTE — Telephone Encounter (Signed)
-----   Message from Verta Ellen., NP sent at 07/19/2019  9:42 AM EDT ----- Regarding: Mr. Erik Logan blood pressure I looked at his blood pressure recordings.  In home it appears he still on average running a little higher systolic.  Tell him to continue checking his blood pressure every day especially after he finishes his steroid therapy.  If his systolic continues to average at 130/80 or above then we need to adjust the losartan up to 50 daily.  Tell him to call us back in a few weeks after steroid therapy has finished or he can come in for blood pressure check and bring the logs with him.  Then we can determine if we need to go up on the losartan or not.  Thanks ----- Message ----- From: Merlene Laughter, RN Sent: 07/19/2019   8:33 AM EDT To: Verta Ellen., NP

## 2019-07-20 NOTE — Telephone Encounter (Signed)
Patient informed and verbalized understanding of plan. 

## 2019-07-24 ENCOUNTER — Emergency Department (HOSPITAL_COMMUNITY): Payer: Medicare Other

## 2019-07-24 ENCOUNTER — Inpatient Hospital Stay (HOSPITAL_COMMUNITY)
Admission: EM | Admit: 2019-07-24 | Discharge: 2019-08-16 | DRG: 193 | Disposition: E | Payer: Medicare Other | Attending: Family Medicine | Admitting: Family Medicine

## 2019-07-24 ENCOUNTER — Encounter (HOSPITAL_COMMUNITY): Payer: Self-pay | Admitting: Emergency Medicine

## 2019-07-24 ENCOUNTER — Other Ambulatory Visit: Payer: Self-pay

## 2019-07-24 DIAGNOSIS — J8489 Other specified interstitial pulmonary diseases: Secondary | ICD-10-CM | POA: Diagnosis present

## 2019-07-24 DIAGNOSIS — E874 Mixed disorder of acid-base balance: Secondary | ICD-10-CM | POA: Diagnosis not present

## 2019-07-24 DIAGNOSIS — E785 Hyperlipidemia, unspecified: Secondary | ICD-10-CM | POA: Diagnosis present

## 2019-07-24 DIAGNOSIS — Z86008 Personal history of in-situ neoplasm of other site: Secondary | ICD-10-CM

## 2019-07-24 DIAGNOSIS — K219 Gastro-esophageal reflux disease without esophagitis: Secondary | ICD-10-CM | POA: Diagnosis present

## 2019-07-24 DIAGNOSIS — E1165 Type 2 diabetes mellitus with hyperglycemia: Secondary | ICD-10-CM | POA: Diagnosis present

## 2019-07-24 DIAGNOSIS — J189 Pneumonia, unspecified organism: Secondary | ICD-10-CM

## 2019-07-24 DIAGNOSIS — Z66 Do not resuscitate: Secondary | ICD-10-CM | POA: Diagnosis present

## 2019-07-24 DIAGNOSIS — J9690 Respiratory failure, unspecified, unspecified whether with hypoxia or hypercapnia: Secondary | ICD-10-CM

## 2019-07-24 DIAGNOSIS — N184 Chronic kidney disease, stage 4 (severe): Secondary | ICD-10-CM | POA: Diagnosis not present

## 2019-07-24 DIAGNOSIS — I5033 Acute on chronic diastolic (congestive) heart failure: Secondary | ICD-10-CM | POA: Diagnosis present

## 2019-07-24 DIAGNOSIS — E875 Hyperkalemia: Secondary | ICD-10-CM | POA: Diagnosis present

## 2019-07-24 DIAGNOSIS — J159 Unspecified bacterial pneumonia: Secondary | ICD-10-CM | POA: Diagnosis not present

## 2019-07-24 DIAGNOSIS — Z87891 Personal history of nicotine dependence: Secondary | ICD-10-CM

## 2019-07-24 DIAGNOSIS — Z91018 Allergy to other foods: Secondary | ICD-10-CM

## 2019-07-24 DIAGNOSIS — Z79899 Other long term (current) drug therapy: Secondary | ICD-10-CM

## 2019-07-24 DIAGNOSIS — R778 Other specified abnormalities of plasma proteins: Secondary | ICD-10-CM

## 2019-07-24 DIAGNOSIS — R0781 Pleurodynia: Secondary | ICD-10-CM

## 2019-07-24 DIAGNOSIS — D649 Anemia, unspecified: Secondary | ICD-10-CM

## 2019-07-24 DIAGNOSIS — R079 Chest pain, unspecified: Secondary | ICD-10-CM | POA: Diagnosis not present

## 2019-07-24 DIAGNOSIS — R0902 Hypoxemia: Secondary | ICD-10-CM

## 2019-07-24 DIAGNOSIS — Z7982 Long term (current) use of aspirin: Secondary | ICD-10-CM

## 2019-07-24 DIAGNOSIS — J9602 Acute respiratory failure with hypercapnia: Secondary | ICD-10-CM | POA: Diagnosis not present

## 2019-07-24 DIAGNOSIS — I252 Old myocardial infarction: Secondary | ICD-10-CM

## 2019-07-24 DIAGNOSIS — R0689 Other abnormalities of breathing: Secondary | ICD-10-CM | POA: Diagnosis not present

## 2019-07-24 DIAGNOSIS — I2510 Atherosclerotic heart disease of native coronary artery without angina pectoris: Secondary | ICD-10-CM | POA: Diagnosis present

## 2019-07-24 DIAGNOSIS — I444 Left anterior fascicular block: Secondary | ICD-10-CM | POA: Diagnosis present

## 2019-07-24 DIAGNOSIS — I130 Hypertensive heart and chronic kidney disease with heart failure and stage 1 through stage 4 chronic kidney disease, or unspecified chronic kidney disease: Secondary | ICD-10-CM | POA: Diagnosis not present

## 2019-07-24 DIAGNOSIS — E1122 Type 2 diabetes mellitus with diabetic chronic kidney disease: Secondary | ICD-10-CM | POA: Diagnosis not present

## 2019-07-24 DIAGNOSIS — Z6828 Body mass index (BMI) 28.0-28.9, adult: Secondary | ICD-10-CM

## 2019-07-24 DIAGNOSIS — I5032 Chronic diastolic (congestive) heart failure: Secondary | ICD-10-CM | POA: Diagnosis present

## 2019-07-24 DIAGNOSIS — I248 Other forms of acute ischemic heart disease: Secondary | ICD-10-CM | POA: Diagnosis present

## 2019-07-24 DIAGNOSIS — E11649 Type 2 diabetes mellitus with hypoglycemia without coma: Secondary | ICD-10-CM | POA: Diagnosis not present

## 2019-07-24 DIAGNOSIS — E872 Acidosis, unspecified: Secondary | ICD-10-CM | POA: Diagnosis present

## 2019-07-24 DIAGNOSIS — Z794 Long term (current) use of insulin: Secondary | ICD-10-CM

## 2019-07-24 DIAGNOSIS — J9811 Atelectasis: Secondary | ICD-10-CM | POA: Diagnosis not present

## 2019-07-24 DIAGNOSIS — I440 Atrioventricular block, first degree: Secondary | ICD-10-CM | POA: Diagnosis present

## 2019-07-24 DIAGNOSIS — Z8249 Family history of ischemic heart disease and other diseases of the circulatory system: Secondary | ICD-10-CM

## 2019-07-24 DIAGNOSIS — J849 Interstitial pulmonary disease, unspecified: Secondary | ICD-10-CM | POA: Diagnosis not present

## 2019-07-24 DIAGNOSIS — R0602 Shortness of breath: Secondary | ICD-10-CM

## 2019-07-24 DIAGNOSIS — E119 Type 2 diabetes mellitus without complications: Secondary | ICD-10-CM

## 2019-07-24 DIAGNOSIS — Z8711 Personal history of peptic ulcer disease: Secondary | ICD-10-CM

## 2019-07-24 DIAGNOSIS — J432 Centrilobular emphysema: Secondary | ICD-10-CM | POA: Diagnosis not present

## 2019-07-24 DIAGNOSIS — R768 Other specified abnormal immunological findings in serum: Secondary | ICD-10-CM | POA: Diagnosis not present

## 2019-07-24 DIAGNOSIS — E663 Overweight: Secondary | ICD-10-CM | POA: Diagnosis present

## 2019-07-24 DIAGNOSIS — I10 Essential (primary) hypertension: Secondary | ICD-10-CM | POA: Diagnosis not present

## 2019-07-24 DIAGNOSIS — D72829 Elevated white blood cell count, unspecified: Secondary | ICD-10-CM

## 2019-07-24 DIAGNOSIS — R627 Adult failure to thrive: Secondary | ICD-10-CM | POA: Diagnosis present

## 2019-07-24 DIAGNOSIS — Z955 Presence of coronary angioplasty implant and graft: Secondary | ICD-10-CM

## 2019-07-24 DIAGNOSIS — D696 Thrombocytopenia, unspecified: Secondary | ICD-10-CM | POA: Diagnosis present

## 2019-07-24 DIAGNOSIS — Z882 Allergy status to sulfonamides status: Secondary | ICD-10-CM

## 2019-07-24 DIAGNOSIS — F419 Anxiety disorder, unspecified: Secondary | ICD-10-CM | POA: Diagnosis present

## 2019-07-24 DIAGNOSIS — T380X5A Adverse effect of glucocorticoids and synthetic analogues, initial encounter: Secondary | ICD-10-CM | POA: Diagnosis not present

## 2019-07-24 DIAGNOSIS — J84112 Idiopathic pulmonary fibrosis: Secondary | ICD-10-CM | POA: Diagnosis not present

## 2019-07-24 DIAGNOSIS — J9601 Acute respiratory failure with hypoxia: Secondary | ICD-10-CM | POA: Diagnosis present

## 2019-07-24 DIAGNOSIS — J439 Emphysema, unspecified: Secondary | ICD-10-CM | POA: Diagnosis present

## 2019-07-24 DIAGNOSIS — N179 Acute kidney failure, unspecified: Secondary | ICD-10-CM | POA: Diagnosis present

## 2019-07-24 DIAGNOSIS — Z515 Encounter for palliative care: Secondary | ICD-10-CM | POA: Diagnosis not present

## 2019-07-24 DIAGNOSIS — Z833 Family history of diabetes mellitus: Secondary | ICD-10-CM

## 2019-07-24 DIAGNOSIS — Z20822 Contact with and (suspected) exposure to covid-19: Secondary | ICD-10-CM | POA: Diagnosis present

## 2019-07-24 DIAGNOSIS — R0789 Other chest pain: Secondary | ICD-10-CM | POA: Diagnosis not present

## 2019-07-24 LAB — PROTIME-INR
INR: 1.3 — ABNORMAL HIGH (ref 0.8–1.2)
Prothrombin Time: 15.2 seconds (ref 11.4–15.2)

## 2019-07-24 LAB — BASIC METABOLIC PANEL
Anion gap: 12 (ref 5–15)
BUN: 34 mg/dL — ABNORMAL HIGH (ref 8–23)
CO2: 21 mmol/L — ABNORMAL LOW (ref 22–32)
Calcium: 8.7 mg/dL — ABNORMAL LOW (ref 8.9–10.3)
Chloride: 101 mmol/L (ref 98–111)
Creatinine, Ser: 1.85 mg/dL — ABNORMAL HIGH (ref 0.61–1.24)
GFR calc Af Amer: 37 mL/min — ABNORMAL LOW (ref >60)
GFR calc non Af Amer: 32 mL/min — ABNORMAL LOW (ref >60)
Glucose, Bld: 417 mg/dL — ABNORMAL HIGH (ref 70–99)
Potassium: 4.9 mmol/L (ref 3.5–5.1)
Sodium: 134 mmol/L — ABNORMAL LOW (ref 135–145)

## 2019-07-24 LAB — APTT: aPTT: 32 seconds (ref 24–36)

## 2019-07-24 LAB — CBC
HCT: 46.5 % (ref 39.0–52.0)
Hemoglobin: 15.4 g/dL (ref 13.0–17.0)
MCH: 29.6 pg (ref 26.0–34.0)
MCHC: 33.1 g/dL (ref 30.0–36.0)
MCV: 89.3 fL (ref 80.0–100.0)
Platelets: 146 10*3/uL — ABNORMAL LOW (ref 150–400)
RBC: 5.21 MIL/uL (ref 4.22–5.81)
RDW: 13.1 % (ref 11.5–15.5)
WBC: 24.3 10*3/uL — ABNORMAL HIGH (ref 4.0–10.5)
nRBC: 0 % (ref 0.0–0.2)

## 2019-07-24 LAB — BRAIN NATRIURETIC PEPTIDE: B Natriuretic Peptide: 713.3 pg/mL — ABNORMAL HIGH (ref 0.0–100.0)

## 2019-07-24 LAB — LACTIC ACID, PLASMA: Lactic Acid, Venous: 2 mmol/L (ref 0.5–1.9)

## 2019-07-24 LAB — TROPONIN I (HIGH SENSITIVITY): Troponin I (High Sensitivity): 105 ng/L (ref <18)

## 2019-07-24 MED ORDER — SODIUM CHLORIDE 0.9 % IV SOLN
500.0000 mg | Freq: Every day | INTRAVENOUS | Status: DC
  Administered 2019-07-24: 500 mg via INTRAVENOUS
  Filled 2019-07-24 (×2): qty 500

## 2019-07-24 MED ORDER — SODIUM CHLORIDE 0.9 % IV SOLN
2.0000 g | Freq: Every day | INTRAVENOUS | Status: DC
  Administered 2019-07-24: 2 g via INTRAVENOUS
  Filled 2019-07-24: qty 20

## 2019-07-24 MED ORDER — ASPIRIN 81 MG PO CHEW
324.0000 mg | CHEWABLE_TABLET | Freq: Once | ORAL | Status: AC
  Administered 2019-07-24: 324 mg via ORAL
  Filled 2019-07-24: qty 4

## 2019-07-24 MED ORDER — SODIUM CHLORIDE 0.9% FLUSH: 3.0000 mL | Freq: Once | INTRAVENOUS | Status: DC

## 2019-07-24 NOTE — ED Notes (Signed)
Date and time results received: 07/28/2019  (use smartphrase ".now" to insert current time)  Test: trop  Critical Value: 105 Name of Provider Notified:[place]  Orders Received? Or Actions Taken? lowered, call bell in reach.

## 2019-07-24 NOTE — ED Triage Notes (Signed)
Pt c/o left side cp and sob for the past few days getting worse today, pt is 89% on RA on triage, placed on 2L increase to 91%.

## 2019-07-24 NOTE — ED Notes (Signed)
ED Provider at bedside. 

## 2019-07-24 NOTE — ED Notes (Signed)
Family at bedside. 

## 2019-07-24 NOTE — ED Provider Notes (Signed)
[facility] EMERGENCY DEPARTMENT Provider Note   CSN: [id number] Arrival date & time: 07/28/2019  1922     History Chief Complaint  Patient presents with  . Chest Pain    Erik Logan is a 86 y.o. male with a hx of CKD, CAD (NSTEMI 2019 S/P PCI mid PDA, DES to distal RCA, DES prox RCA. Medical therapy of residual disease), NIDDM, COPD, CHF, HTN, HLD presents to the Emergency Department complaining of gradual, persistent, progressively worsening shortness of breath onset approximately 3 months ago with an acute worsening in the last 3 days.  Patient had associated chest pain tonight onset between 7-8 PM.  He reports his chest pain has improved but has not resolved.  He states his primary concern is that he is significantly short of breath.  He reports that today he has been short of breath even at rest.  He reports 10 pound weight loss in the last several weeks.  He attributes this to an increase in his Lasix from 1/2 tablet to 1 tablet/day because he had noticed some swelling in his legs.  Patient was seen by his primary care 2 weeks ago for worsening shortness of breath and given prednisone IM and oral which he has been taking for the last 2 weeks.  Patient reports 2 months ago he had some shortness of breath walking longer distances but was able to mow his yard on the riding mower.  He reports he has been unable to do anything of that sort for the last several weeks.  Patient reports he is fully vaccinated against Covid.  No known sick contacts.  He denies fever, chills, headache, neck pain, abdominal pain, vomiting, syncope, dysuria.   Records reviewed.  Patient admitted to [facility] on 03/16/2019 with chest pain and shortness of breath.  He was ruled out for ACS at that time with a negative troponin x3, a BNP of 1237 and negative chest x-ray.  He was given Lasix with improved symptoms.    The history is provided by the patient and medical records. No language interpreter  was used.    HPI: A 86 year old patient with a history of treated diabetes, hypertension and hypercholesterolemia presents for evaluation of chest pain. Initial onset of pain was approximately 1-3 hours ago. The patient's chest pain is described as heaviness/pressure/tightness and is worse with exertion. The patient's chest pain is not middle- or left-sided, is not well-localized, is not sharp and does not radiate to the arms/jaw/neck. The patient does not complain of nausea and denies diaphoresis. The patient has no history of stroke, has no history of peripheral artery disease, has not smoked in the past 90 days, has no relevant family history of coronary artery disease (first degree relative at less than age 71) and does not have an elevated BMI (>=30).   Past Medical History:  Diagnosis Date  . Chronic renal insufficiency, stage 2 (mild)   . Coronary artery disease   . History of duodenal ulcer 1970s  . Hyperlipidemia   . Hypertension   . NSTEMI (non-ST elevated myocardial infarction) ([place]) 06/01/2017  . SCC (squamous cell carcinoma) Well Diff 10/10/2015   Left Wrist (Cx3,5FU)  . Squamous cell carcinoma in situ (SCCIS) 10/10/2015   Left Arm (Cx3,5FU)  . Squamous cell carcinoma in situ (SCCIS) x3 08/25/2018   Left Hand Medial, Left Hand Lateral and Left Arm  . Type II diabetes mellitus [facility])     Patient Active Problem List   Diagnosis Date  Noted  . Non-ST elevated myocardial infarction (non-STEMI) ([place]) 06/01/2017  . Essential (primary) hypertension 06/01/2017  . Hyperlipidemia 06/01/2017  . Type 2 diabetes mellitus without complication, with long-term current use of insulin ([place]) 06/01/2017  . CKD (chronic kidney disease) stage 4, GFR 15-29 ml/min ([facility]) 06/01/2017  . Back pain 06/01/2017    Past Surgical History:  Procedure Laterality Date  . CATARACT EXTRACTION W/ INTRAOCULAR LENS IMPLANT Left   . CORONARY ANGIOPLASTY WITH STENT PLACEMENT  06/02/2017  . CORONARY BALLOON  ANGIOPLASTY N/A 06/02/2017   Procedure: CORONARY BALLOON ANGIOPLASTY;  Surgeon: Troy Sine, MD;  Location: [place] CV LAB;  Service: Cardiovascular;  Laterality: N/A;  . CORONARY STENT INTERVENTION N/A 06/02/2017   Procedure: CORONARY STENT INTERVENTION;  Surgeon: Troy Sine, MD;  Location: [place] CV LAB;  Service: Cardiovascular;  Laterality: N/A;  . EYE SURGERY Right 1950s   "S/P injury in ~ 1942 which ; put a hole in the retina; can't see out of it; cataract developed; surgery to remove cataract in 1950  . LEFT HEART CATH AND CORONARY ANGIOGRAPHY N/A 06/02/2017   Procedure: LEFT HEART CATH AND CORONARY ANGIOGRAPHY;  Surgeon: Troy Sine, MD;  Location: [place] CV [facility];  Service: Cardiovascular;  Laterality: N/A;  . TONSILLECTOMY  1930s       Family History  Problem Relation Age of Onset  . Diabetes Mother   . Hypertension Mother   . Pulmonary disease Father   . Pulmonary disease Sister   . Healthy Sister     Social History   Tobacco Use  . Smoking status: Former Smoker    Packs/day: 1.00    Years: 32.00    Pack years: 32.00    Types: Cigarettes  . Smokeless tobacco: Never Used  . Tobacco comment: Quit around age 16  Substance Use Topics  . Alcohol use: No  . Drug use: No    Home Medications Prior to Admission medications   Medication Sig Start Date End Date Taking? Authorizing Provider  amLODipine (NORVASC) 5 MG tablet Take 1 tablet by mouth once daily 06/06/19  Yes Branch, Alphonse Guild, MD  aspirin EC 81 MG tablet Take 1 tablet (81 mg total) by mouth daily. 06/03/17  Yes Bhagat, Bhavinkumar, PA  atorvastatin (LIPITOR) 40 MG tablet Take 1 tablet by mouth once daily 06/06/19  Yes Branch, Alphonse Guild, MD  furosemide (LASIX) 40 MG tablet Take 20-40 mg by mouth daily. Take 1/2 tablet daily, take an additional 1 tablet with noticeable swelling.   Yes [provider]  insulin lispro (HUMALOG) 100 UNIT/ML injection Inject 5 Units into the skin See  admin instructions. Per Sliding Scale   Yes [provider]  isosorbide mononitrate (IMDUR) 30 MG 24 hr tablet Take 1 tablet by mouth once daily 12/24/18  Yes Branch, Alphonse Guild, MD  losartan (COZAAR) 25 MG tablet Take 1 tablet (25 mg total) by mouth daily. 07/05/19 10/03/19 Yes Verta Ellen., NP  metoprolol tartrate (LOPRESSOR) 25 MG tablet Take 0.5 tablets (12.5 mg total) by mouth daily. 06/03/17  Yes Bhagat, Bhavinkumar, PA  nitroGLYCERIN (NITROSTAT) 0.4 MG SL tablet DISSOLVE ONE TABLET UNDER THE TONGUE EVERY 5 MINUTES AS NEEDED FOR CHEST PAIN.  DO NOT EXCEED A TOTAL OF 3 DOSES IN 15 MINUTES Patient taking differently: Place 0.4 mg under the tongue every 5 (five) minutes as needed for chest pain. Do not exceed 3 doses in 15 minutes. 07/09/18  Yes Branch, Alphonse Guild, MD  omeprazole (  PRILOSEC) 20 MG capsule Take 20 mg by mouth daily as needed (Heartburn).    Yes [provider]  Polyethyl Glycol-Propyl Glycol (SYSTANE OP) Apply 2 drops to eye daily as needed (Dry eyes).   Yes [provider]  TOUJEO SOLOSTAR 300 UNIT/ML SOPN Inject 20 Units into the skin 2 (two) times daily.  03/25/18  Yes [provider]    Allergies    Beef-derived products and Sulfa antibiotics  Review of Systems   Review of Systems  Constitutional: Positive for fatigue. Negative for appetite change, diaphoresis, fever and unexpected weight change.  HENT: Negative for mouth sores.   Eyes: Negative for visual disturbance.  Respiratory: Positive for shortness of breath. Negative for cough, chest tightness and wheezing.   Cardiovascular: Positive for chest pain.  Gastrointestinal: Negative for abdominal pain, constipation, diarrhea, nausea and vomiting.  Endocrine: Negative for polydipsia, polyphagia and polyuria.  Genitourinary: Negative for dysuria, frequency, hematuria and urgency.  Musculoskeletal: Negative for back pain and neck stiffness.  Skin: Negative for rash.    Allergic/Immunologic: Negative for immunocompromised state.  Neurological: Negative for syncope, light-headedness and headaches.  Hematological: Does not bruise/bleed easily.  Psychiatric/Behavioral: Negative for sleep disturbance. The patient is not nervous/anxious.     Physical Exam Updated Vital Signs BP (!) 156/74   Pulse 88   Temp 98.6 F (37 C)   Resp (!) 21   Ht 5' 8.5" (1.74 m)   Wt 87.2 kg   SpO2 95%   BMI 28.81 kg/m   Physical Exam Vitals and nursing note reviewed.  Constitutional:      General: He is not in acute distress.    Appearance: He is not diaphoretic.  HENT:     Head: Normocephalic.  Eyes:     General: No scleral icterus.    Conjunctiva/sclera: Conjunctivae normal.  Cardiovascular:     Rate and Rhythm: Normal rate and regular rhythm.     Pulses: Normal pulses.          Radial pulses are 2+ on the right side and 2+ on the left side.  Pulmonary:     Effort: No tachypnea, accessory muscle usage, prolonged expiration, respiratory distress or retractions.     Breath sounds: No stridor.     Comments: Equal chest rise. No increased work of breathing. Abdominal:     General: There is no distension.     Palpations: Abdomen is soft.     Tenderness: There is no abdominal tenderness. There is no guarding or rebound.  Musculoskeletal:     Cervical back: Normal range of motion.     Comments: Moves all extremities equally and without difficulty.  Skin:    General: Skin is warm and dry.     Capillary Refill: Capillary refill takes less than 2 seconds.  Neurological:     Mental Status: He is alert.     GCS: GCS eye subscore is 4. GCS verbal subscore is 5. GCS motor subscore is 6.     Comments: Speech is clear and goal oriented.  Psychiatric:        Mood and Affect: Mood normal.     ED Results / Procedures / Treatments   Labs (all labs ordered are listed, but only abnormal results are displayed) Labs Reviewed  BASIC METABOLIC PANEL - Abnormal; Notable  for the following components:      Result Value   Sodium 134 (*)    CO2 21 (*)    Glucose, Bld 417 (*)  BUN 34 (*)    Creatinine, Ser 1.85 (*)    Calcium 8.7 (*)    GFR calc non Af Amer 32 (*)    GFR calc Af Amer 37 (*)    All other components within normal limits  CBC - Abnormal; Notable for the following components:   WBC 24.3 (*)    Platelets 146 (*)    All other components within normal limits  LACTIC ACID, PLASMA - Abnormal; Notable for the following components:   Lactic Acid, Venous 2.0 (*)    All other components within normal limits  LACTIC ACID, PLASMA - Abnormal; Notable for the following components:   Lactic Acid, Venous 2.5 (*)    All other components within normal limits  PROTIME-INR - Abnormal; Notable for the following components:   INR 1.3 (*)    All other components within normal limits  BRAIN NATRIURETIC PEPTIDE - Abnormal; Notable for the following components:   B Natriuretic Peptide 713.3 (*)    All other components within normal limits  TROPONIN I (HIGH SENSITIVITY) - Abnormal; Notable for the following components:   Troponin I (High Sensitivity) 105 (*)    All other components within normal limits  TROPONIN I (HIGH SENSITIVITY) - Abnormal; Notable for the following components:   Troponin I (High Sensitivity) 82 (*)    All other components within normal limits  RESPIRATORY PANEL BY RT PCR (FLU A&B, COVID)  CULTURE, BLOOD (ROUTINE X 2)  CULTURE, BLOOD (ROUTINE X 2)  URINE CULTURE  APTT  URINALYSIS, ROUTINE W REFLEX MICROSCOPIC    EKG EKG Interpretation  Date/Time:  Sunday Jul 24 2019 19:26:05 EDT Ventricular Rate:  81 PR Interval:  220 QRS Duration: 82 QT Interval:  388 QTC Calculation: 450 R Axis:   -147 Text Interpretation: Sinus rhythm with 1st degree A-V block Right superior axis deviation Septal infarct , age undetermined Abnormal ECG poor baseline looks improved from prior 3/19 Confirmed by Aletta Edouard [phone]) on 08/01/2019 10:30:01  PM   EKG Interpretation  Date/Time:  Sunday Jul 24 2019 23:19:39 EDT Ventricular Rate:  79 PR Interval:  220 QRS Duration: 95 QT Interval:  392 QTC Calculation: 450 R Axis:   74 Text Interpretation: Sinus rhythm Prolonged PR interval Consider left atrial enlargement Probable anteroseptal infarct, old no acute st/ts Confirmed by Aletta Edouard [phone]) on 08/15/2019 11:23:05 PM         Radiology DG Chest 2 View  Result Date: 08/08/2019 CLINICAL DATA:  Left-sided chest pain. EXAM: CHEST - 2 VIEW COMPARISON:  March 16, 2019 FINDINGS: Mild-to-moderate severity chronic appearing increased interstitial lung markings are noted bilaterally. Mild atelectasis and/or infiltrate is seen along the lateral aspect of the mid right lung. There is no evidence of a pleural effusion or pneumothorax. The heart size and mediastinal contours are within normal limits. Multilevel degenerative changes seen throughout the thoracic spine. IMPRESSION: 1. Mild atelectasis and/or infiltrate along the lateral aspect of the mid right lung. 2. Chronic appearing increased interstitial lung markings. Electronically Signed   By: Virgina Norfolk M.D.   On: 08/15/2019 20:47    Procedures .Critical Care Performed by: Abigail Butts, PA-C Authorized by: Abigail Butts, PA-C   Critical care provider statement:    Critical care time (minutes):  45   Critical care time was exclusive of:  Separately billable procedures and treating other patients and teaching time   Critical care was necessary to treat or prevent imminent or life-threatening deterioration of the following conditions:  Respiratory failure  Critical care was time spent personally by me on the following activities:  Discussions with consultants, evaluation of patient's response to treatment, examination of patient, ordering and performing treatments and interventions, ordering and review of laboratory studies, ordering and review of radiographic  studies, pulse oximetry, re-evaluation of patient's condition, obtaining history from patient or surrogate and review of old charts   I assumed direction of critical care for this patient from another provider in my specialty: no     (including critical care time)  Medications Ordered in ED Medications  sodium chloride flush (NS) 0.9 % injection 3 mL (3 mLs Intravenous Not Given 07/21/2019 2334)  cefTRIAXone (ROCEPHIN) 2 g in sodium chloride 0.9 % 100 mL IVPB (0 g Intravenous Stopped 07/25/19 0010)  azithromycin (ZITHROMAX) 500 mg in sodium chloride 0.9 % 250 mL IVPB (0 mg Intravenous Stopped 07/25/19 0051)  sodium chloride 0.9 % bolus 500 mL (has no administration in time range)  aspirin chewable tablet 324 mg (324 mg Oral Given 07/26/2019 2331)    ED Course  I have reviewed the triage vital signs and the nursing notes.  Pertinent labs & imaging results that were available during my care of the patient were reviewed by me and considered in my medical decision making (see chart for details).  Clinical Course as of Jul 25 139  Sun Jul 23, 3369  3959 86 year old male here with increased shortness of breath over months but more progress over the last few days with now some right-sided chest pain.  Was hypoxic on arrival improved on nasal cannula.  EKG not showing a STEMI.  Getting chest x-ray lab work and likely will need admission antibiotics and further work-up.   [MB]  2242 No fevers - pt has been on oral steroids for the last 2 weeks for his "breathing" prescribed buy PMD.    WBC(!): 24.3 [HM]  2243 Elevated from baseline of 1.4  Creatinine(!): 1.85 [HM]  2245 Elevated - no previous  Troponin I (High Sensitivity)(!!): 105 [HM]    Clinical Course User Index [HM] Esti Demello, Jarrett Soho, PA-C [MB] Hayden Rasmussen, MD   MDM Rules/Calculators/A&P HEAR Score: 6                     Patient presents emergency department with shortness of breath and chest pain.  Initially troponin is elevated  however this seems to be more demand ischemia.  EKG without acute changes.  Repeat troponin 82, down from initial.  Patient with significant leukocytosis at 24.3.  Suspect this is likely secondary to infection and his current use of steroids.  Chest x-ray shows infiltrate versus atelectasis however given the patient's symptoms favor infiltrate.  Patient hypoxic on arrival at 89%.  Patient placed on 2 L via nasal cannula with improvement into the low 90s.  He is tachypneic.  Patient does not wear oxygen at home.  Lactic acid 2.0 and repeat 2.5.  Patient given 500 mL of fluid as he has a history of CHF and is without fever, tachycardia or hypotension.  No evidence of septic shock.  Additionally, patient with mild AKI.  Patient critically ill requiring oxygen support, fluids and treatment of his infection.  Will be admitted to the hospitalist.  The patient was discussed with and seen by Dr. Melina Copa who agrees with the treatment plan.  1:39 AM Discussed patient's case with hospitalist, [place].  I have recommended admission and patient (and family if present) agree with this plan. Admitting physician will place admission  orders.     Final Clinical Impression(s) / ED Diagnoses Final diagnoses:  Shortness of breath  Leukocytosis, unspecified type  Hypoxia  Elevated troponin  Community acquired pneumonia of right lung, unspecified part of lung  AKI (acute kidney injury) [facility])    Rx / [place] Orders ED Discharge Orders    None       Cait Locust, Gwenlyn Perking 07/25/19 0141    Hayden Rasmussen, MD 07/25/19 1046

## 2019-07-25 ENCOUNTER — Encounter (HOSPITAL_COMMUNITY): Payer: Self-pay | Admitting: Family Medicine

## 2019-07-25 ENCOUNTER — Inpatient Hospital Stay (HOSPITAL_COMMUNITY): Payer: Medicare Other

## 2019-07-25 DIAGNOSIS — I10 Essential (primary) hypertension: Secondary | ICD-10-CM | POA: Diagnosis not present

## 2019-07-25 DIAGNOSIS — E785 Hyperlipidemia, unspecified: Secondary | ICD-10-CM | POA: Diagnosis present

## 2019-07-25 DIAGNOSIS — J8489 Other specified interstitial pulmonary diseases: Secondary | ICD-10-CM | POA: Diagnosis present

## 2019-07-25 DIAGNOSIS — I130 Hypertensive heart and chronic kidney disease with heart failure and stage 1 through stage 4 chronic kidney disease, or unspecified chronic kidney disease: Secondary | ICD-10-CM | POA: Diagnosis present

## 2019-07-25 DIAGNOSIS — E1122 Type 2 diabetes mellitus with diabetic chronic kidney disease: Secondary | ICD-10-CM | POA: Diagnosis present

## 2019-07-25 DIAGNOSIS — E11649 Type 2 diabetes mellitus with hypoglycemia without coma: Secondary | ICD-10-CM | POA: Diagnosis not present

## 2019-07-25 DIAGNOSIS — Z20822 Contact with and (suspected) exposure to covid-19: Secondary | ICD-10-CM | POA: Diagnosis present

## 2019-07-25 DIAGNOSIS — E874 Mixed disorder of acid-base balance: Secondary | ICD-10-CM | POA: Diagnosis present

## 2019-07-25 DIAGNOSIS — N184 Chronic kidney disease, stage 4 (severe): Secondary | ICD-10-CM | POA: Diagnosis not present

## 2019-07-25 DIAGNOSIS — J9601 Acute respiratory failure with hypoxia: Secondary | ICD-10-CM | POA: Diagnosis present

## 2019-07-25 DIAGNOSIS — J9602 Acute respiratory failure with hypercapnia: Secondary | ICD-10-CM | POA: Diagnosis present

## 2019-07-25 DIAGNOSIS — Z515 Encounter for palliative care: Secondary | ICD-10-CM | POA: Diagnosis not present

## 2019-07-25 DIAGNOSIS — T380X5A Adverse effect of glucocorticoids and synthetic analogues, initial encounter: Secondary | ICD-10-CM | POA: Diagnosis present

## 2019-07-25 DIAGNOSIS — Z6828 Body mass index (BMI) 28.0-28.9, adult: Secondary | ICD-10-CM | POA: Diagnosis not present

## 2019-07-25 DIAGNOSIS — E119 Type 2 diabetes mellitus without complications: Secondary | ICD-10-CM

## 2019-07-25 DIAGNOSIS — N179 Acute kidney failure, unspecified: Secondary | ICD-10-CM | POA: Diagnosis present

## 2019-07-25 DIAGNOSIS — Z794 Long term (current) use of insulin: Secondary | ICD-10-CM | POA: Diagnosis not present

## 2019-07-25 DIAGNOSIS — I2510 Atherosclerotic heart disease of native coronary artery without angina pectoris: Secondary | ICD-10-CM | POA: Diagnosis present

## 2019-07-25 DIAGNOSIS — I248 Other forms of acute ischemic heart disease: Secondary | ICD-10-CM | POA: Diagnosis present

## 2019-07-25 DIAGNOSIS — R768 Other specified abnormal immunological findings in serum: Secondary | ICD-10-CM | POA: Diagnosis not present

## 2019-07-25 DIAGNOSIS — Z66 Do not resuscitate: Secondary | ICD-10-CM | POA: Diagnosis not present

## 2019-07-25 DIAGNOSIS — J84112 Idiopathic pulmonary fibrosis: Secondary | ICD-10-CM | POA: Diagnosis not present

## 2019-07-25 DIAGNOSIS — I5032 Chronic diastolic (congestive) heart failure: Secondary | ICD-10-CM | POA: Diagnosis not present

## 2019-07-25 DIAGNOSIS — J159 Unspecified bacterial pneumonia: Secondary | ICD-10-CM | POA: Diagnosis present

## 2019-07-25 DIAGNOSIS — J189 Pneumonia, unspecified organism: Secondary | ICD-10-CM | POA: Diagnosis present

## 2019-07-25 DIAGNOSIS — I5033 Acute on chronic diastolic (congestive) heart failure: Secondary | ICD-10-CM | POA: Diagnosis present

## 2019-07-25 DIAGNOSIS — J9811 Atelectasis: Secondary | ICD-10-CM | POA: Diagnosis present

## 2019-07-25 DIAGNOSIS — E1165 Type 2 diabetes mellitus with hyperglycemia: Secondary | ICD-10-CM | POA: Diagnosis present

## 2019-07-25 DIAGNOSIS — F419 Anxiety disorder, unspecified: Secondary | ICD-10-CM | POA: Diagnosis not present

## 2019-07-25 DIAGNOSIS — J432 Centrilobular emphysema: Secondary | ICD-10-CM | POA: Diagnosis not present

## 2019-07-25 DIAGNOSIS — E663 Overweight: Secondary | ICD-10-CM | POA: Diagnosis present

## 2019-07-25 DIAGNOSIS — R0602 Shortness of breath: Secondary | ICD-10-CM | POA: Diagnosis not present

## 2019-07-25 DIAGNOSIS — J9690 Respiratory failure, unspecified, unspecified whether with hypoxia or hypercapnia: Secondary | ICD-10-CM | POA: Diagnosis not present

## 2019-07-25 DIAGNOSIS — J849 Interstitial pulmonary disease, unspecified: Secondary | ICD-10-CM | POA: Diagnosis not present

## 2019-07-25 DIAGNOSIS — R0902 Hypoxemia: Secondary | ICD-10-CM | POA: Diagnosis not present

## 2019-07-25 LAB — RESPIRATORY PANEL BY RT PCR (FLU A&B, COVID)
Influenza A by PCR: NEGATIVE
Influenza B by PCR: NEGATIVE
SARS Coronavirus 2 by RT PCR: NEGATIVE

## 2019-07-25 LAB — CBC WITH DIFFERENTIAL/PLATELET
Abs Immature Granulocytes: 0.22 10*3/uL — ABNORMAL HIGH (ref 0.00–0.07)
Basophils Absolute: 0.1 10*3/uL (ref 0.0–0.1)
Basophils Relative: 0 %
Eosinophils Absolute: 0 10*3/uL (ref 0.0–0.5)
Eosinophils Relative: 0 %
HCT: 44.9 % (ref 39.0–52.0)
Hemoglobin: 14.8 g/dL (ref 13.0–17.0)
Immature Granulocytes: 1 %
Lymphocytes Relative: 8 %
Lymphs Abs: 1.9 10*3/uL (ref 0.7–4.0)
MCH: 29.5 pg (ref 26.0–34.0)
MCHC: 33 g/dL (ref 30.0–36.0)
MCV: 89.4 fL (ref 80.0–100.0)
Monocytes Absolute: 1.3 10*3/uL — ABNORMAL HIGH (ref 0.1–1.0)
Monocytes Relative: 5 %
Neutro Abs: 21.4 10*3/uL — ABNORMAL HIGH (ref 1.7–7.7)
Neutrophils Relative %: 86 %
Platelets: 143 10*3/uL — ABNORMAL LOW (ref 150–400)
RBC: 5.02 MIL/uL (ref 4.22–5.81)
RDW: 13.2 % (ref 11.5–15.5)
WBC: 24.9 10*3/uL — ABNORMAL HIGH (ref 4.0–10.5)
nRBC: 0 % (ref 0.0–0.2)

## 2019-07-25 LAB — CBG MONITORING, ED: Glucose-Capillary: 368 mg/dL — ABNORMAL HIGH (ref 70–99)

## 2019-07-25 LAB — GLUCOSE, CAPILLARY
Glucose-Capillary: 247 mg/dL — ABNORMAL HIGH (ref 70–99)
Glucose-Capillary: 200 mg/dL — ABNORMAL HIGH (ref 70–99)
Glucose-Capillary: 86 mg/dL (ref 70–99)
Glucose-Capillary: 82 mg/dL (ref 70–99)
Glucose-Capillary: 142 mg/dL — ABNORMAL HIGH (ref 70–99)

## 2019-07-25 LAB — LACTIC ACID, PLASMA
Lactic Acid, Venous: 2.5 mmol/L (ref 0.5–1.9)
Lactic Acid, Venous: 2.2 mmol/L (ref 0.5–1.9)

## 2019-07-25 LAB — BASIC METABOLIC PANEL
Anion gap: 11 (ref 5–15)
BUN: 37 mg/dL — ABNORMAL HIGH (ref 8–23)
CO2: 19 mmol/L — ABNORMAL LOW (ref 22–32)
Calcium: 8.3 mg/dL — ABNORMAL LOW (ref 8.9–10.3)
Chloride: 106 mmol/L (ref 98–111)
Creatinine, Ser: 1.88 mg/dL — ABNORMAL HIGH (ref 0.61–1.24)
GFR calc Af Amer: 36 mL/min — ABNORMAL LOW (ref >60)
GFR calc non Af Amer: 31 mL/min — ABNORMAL LOW (ref >60)
Glucose, Bld: 350 mg/dL — ABNORMAL HIGH (ref 70–99)
Potassium: 3.7 mmol/L (ref 3.5–5.1)
Sodium: 136 mmol/L (ref 135–145)

## 2019-07-25 LAB — HEMOGLOBIN A1C
Hgb A1c MFr Bld: 10.7 % — ABNORMAL HIGH (ref 4.8–5.6)
Mean Plasma Glucose: 260.39 mg/dL

## 2019-07-25 LAB — TROPONIN I (HIGH SENSITIVITY): Troponin I (High Sensitivity): 82 ng/L — ABNORMAL HIGH

## 2019-07-25 LAB — PROCALCITONIN: Procalcitonin: 0.42 ng/mL

## 2019-07-25 MED ORDER — ACETAMINOPHEN 325 MG PO TABS
650.0000 mg | ORAL_TABLET | Freq: Four times a day (QID) | ORAL | Status: DC
  Administered 2019-07-25 – 2019-07-26 (×2): 650 mg via ORAL
  Filled 2019-07-25 (×3): qty 2

## 2019-07-25 MED ORDER — ACETAMINOPHEN 650 MG RE SUPP: 650.0000 mg | Freq: Four times a day (QID) | RECTAL | Status: DC | PRN

## 2019-07-25 MED ORDER — INSULIN ASPART 100 UNIT/ML ~~LOC~~ SOLN
0.0000 [IU] | Freq: Every day | SUBCUTANEOUS | Status: DC
  Administered 2019-07-25: 03:00:00 5 [IU] via SUBCUTANEOUS
  Administered 2019-07-26 – 2019-07-27 (×2): 3 [IU] via SUBCUTANEOUS
  Administered 2019-07-28: 2 [IU] via SUBCUTANEOUS

## 2019-07-25 MED ORDER — ONDANSETRON HCL 4 MG PO TABS: 4.0000 mg | ORAL_TABLET | Freq: Four times a day (QID) | ORAL | Status: DC | PRN

## 2019-07-25 MED ORDER — FUROSEMIDE 10 MG/ML IJ SOLN
60.0000 mg | Freq: Once | INTRAMUSCULAR | Status: AC
  Administered 2019-07-25: 60 mg via INTRAVENOUS
  Filled 2019-07-25: qty 6

## 2019-07-25 MED ORDER — MORPHINE SULFATE (PF) 2 MG/ML IV SOLN
1.0000 mg | INTRAVENOUS | Status: DC | PRN
  Administered 2019-08-01: 1 mg via INTRAVENOUS
  Filled 2019-07-25 (×2): qty 1

## 2019-07-25 MED ORDER — PANTOPRAZOLE SODIUM 40 MG PO TBEC
40.0000 mg | DELAYED_RELEASE_TABLET | Freq: Every day | ORAL | Status: DC
  Administered 2019-07-26 – 2019-08-02 (×8): 40 mg via ORAL
  Filled 2019-07-25 (×10): qty 1

## 2019-07-25 MED ORDER — AMLODIPINE BESYLATE 5 MG PO TABS
5.0000 mg | ORAL_TABLET | Freq: Every day | ORAL | Status: DC
  Administered 2019-07-26 – 2019-08-02 (×8): 5 mg via ORAL
  Filled 2019-07-25 (×9): qty 1

## 2019-07-25 MED ORDER — SODIUM CHLORIDE 0.9% FLUSH
3.0000 mL | INTRAVENOUS | Status: DC | PRN
  Administered 2019-08-01: 3 mL via INTRAVENOUS

## 2019-07-25 MED ORDER — SODIUM CHLORIDE 0.9 % IV SOLN
2.0000 g | INTRAVENOUS | Status: AC
  Administered 2019-07-25 – 2019-07-28 (×4): 2 g via INTRAVENOUS
  Filled 2019-07-25 (×4): qty 2

## 2019-07-25 MED ORDER — INSULIN DETEMIR 100 UNIT/ML ~~LOC~~ SOLN
8.0000 [IU] | Freq: Two times a day (BID) | SUBCUTANEOUS | Status: DC
  Administered 2019-07-25 (×3): 8 [IU] via SUBCUTANEOUS
  Filled 2019-07-25 (×6): qty 0.08

## 2019-07-25 MED ORDER — ONDANSETRON HCL 4 MG/2ML IJ SOLN: 4.0000 mg | Freq: Four times a day (QID) | INTRAMUSCULAR | Status: DC | PRN

## 2019-07-25 MED ORDER — INSULIN ASPART 100 UNIT/ML ~~LOC~~ SOLN
0.0000 [IU] | Freq: Three times a day (TID) | SUBCUTANEOUS | Status: DC
  Administered 2019-07-25: 3 [IU] via SUBCUTANEOUS
  Administered 2019-07-25 – 2019-07-26 (×2): 2 [IU] via SUBCUTANEOUS
  Administered 2019-07-27: 7 [IU] via SUBCUTANEOUS
  Administered 2019-07-27 (×2): 3 [IU] via SUBCUTANEOUS
  Administered 2019-07-28 (×2): 2 [IU] via SUBCUTANEOUS
  Administered 2019-07-28 – 2019-07-29 (×3): 3 [IU] via SUBCUTANEOUS

## 2019-07-25 MED ORDER — HEPARIN SODIUM (PORCINE) 5000 UNIT/ML IJ SOLN
5000.0000 [IU] | Freq: Three times a day (TID) | INTRAMUSCULAR | Status: DC
  Administered 2019-07-25 – 2019-08-03 (×27): 5000 [IU] via SUBCUTANEOUS
  Filled 2019-07-25 (×27): qty 1

## 2019-07-25 MED ORDER — LIVING WELL WITH DIABETES BOOK
Freq: Once | Status: DC
  Filled 2019-07-25: qty 1

## 2019-07-25 MED ORDER — SODIUM CHLORIDE 0.9% FLUSH
3.0000 mL | Freq: Two times a day (BID) | INTRAVENOUS | Status: DC
  Administered 2019-07-26 – 2019-07-29 (×6): 3 mL via INTRAVENOUS

## 2019-07-25 MED ORDER — AZITHROMYCIN 250 MG PO TABS
500.0000 mg | ORAL_TABLET | Freq: Every day | ORAL | Status: DC
  Administered 2019-07-26 – 2019-07-28 (×3): 500 mg via ORAL
  Filled 2019-07-25 (×4): qty 2

## 2019-07-25 MED ORDER — ISOSORBIDE MONONITRATE ER 30 MG PO TB24
30.0000 mg | ORAL_TABLET | Freq: Every day | ORAL | Status: DC
  Administered 2019-07-26 – 2019-08-02 (×8): 30 mg via ORAL
  Filled 2019-07-25 (×9): qty 1

## 2019-07-25 MED ORDER — GUAIFENESIN-DM 100-10 MG/5ML PO SYRP
5.0000 mL | ORAL_SOLUTION | ORAL | Status: DC | PRN
  Administered 2019-07-25 – 2019-07-31 (×6): 5 mL via ORAL
  Filled 2019-07-25 (×6): qty 5

## 2019-07-25 MED ORDER — ASPIRIN EC 81 MG PO TBEC
81.0000 mg | DELAYED_RELEASE_TABLET | Freq: Every day | ORAL | Status: DC
  Administered 2019-07-26 – 2019-08-02 (×8): 81 mg via ORAL
  Filled 2019-07-25 (×9): qty 1

## 2019-07-25 MED ORDER — ORAL CARE MOUTH RINSE
15.0000 mL | Freq: Two times a day (BID) | OROMUCOSAL | Status: DC
  Administered 2019-07-25 – 2019-08-03 (×17): 15 mL via OROMUCOSAL

## 2019-07-25 MED ORDER — SENNOSIDES-DOCUSATE SODIUM 8.6-50 MG PO TABS: 1.0000 | ORAL_TABLET | Freq: Every evening | ORAL | Status: DC | PRN

## 2019-07-25 MED ORDER — AZITHROMYCIN 500 MG IV SOLR
500.0000 mg | INTRAVENOUS | Status: DC
Start: 2019-07-25 — End: 2019-07-25

## 2019-07-25 MED ORDER — SODIUM CHLORIDE 0.9% FLUSH
3.0000 mL | Freq: Two times a day (BID) | INTRAVENOUS | Status: DC
  Administered 2019-07-25 – 2019-08-03 (×18): 3 mL via INTRAVENOUS

## 2019-07-25 MED ORDER — SODIUM CHLORIDE 0.9 % IV SOLN: 250.0000 mL | INTRAVENOUS | Status: DC | PRN

## 2019-07-25 MED ORDER — METOPROLOL TARTRATE 12.5 MG HALF TABLET
12.5000 mg | ORAL_TABLET | Freq: Every day | ORAL | Status: DC
  Administered 2019-07-26 – 2019-08-02 (×8): 12.5 mg via ORAL
  Filled 2019-07-25 (×9): qty 1

## 2019-07-25 MED ORDER — ACETAMINOPHEN 325 MG PO TABS: 650.0000 mg | ORAL_TABLET | Freq: Four times a day (QID) | ORAL | Status: DC | PRN

## 2019-07-25 MED ORDER — SODIUM CHLORIDE 0.9 % IV BOLUS
500.0000 mL | Freq: Once | INTRAVENOUS | Status: AC
  Administered 2019-07-25: 03:00:00 500 mL via INTRAVENOUS

## 2019-07-25 MED ORDER — ATORVASTATIN CALCIUM 40 MG PO TABS
40.0000 mg | ORAL_TABLET | Freq: Every day | ORAL | Status: DC
  Administered 2019-07-26 – 2019-08-02 (×8): 40 mg via ORAL
  Filled 2019-07-25 (×9): qty 1

## 2019-07-25 MED ORDER — MORPHINE SULFATE (PF) 2 MG/ML IV SOLN
1.0000 mg | Freq: Once | INTRAVENOUS | Status: DC
  Filled 2019-07-25: qty 1

## 2019-07-25 NOTE — Progress Notes (Signed)
Patient was placed on PPV earlier for chest pain induced shortness of breath.  He is now breathing comfortably on 8 L HFNC.  He denies a hx of OSA.  Saturation 94%.  Bipap remains on standby.

## 2019-07-25 NOTE — Progress Notes (Addendum)
Inpatient Diabetes Program Recommendations  AACE/ADA: New Consensus Statement on Inpatient Glycemic Control   Target Ranges:  Prepandial:   less than 140 mg/dL      Peak postprandial:   less than 180 mg/dL (1-2 hours)      Critically ill patients:  140 - 180 mg/dL   Results for KHAIR, CHASTEEN (MRN [id number]) as of 07/25/2019 09:46  Ref. Range 07/25/2019 03:06 07/25/2019 08:08  Glucose-Capillary Latest Ref Range: 70 - 99 mg/dL 368 (H)  Novolog 5 units  Levemir 8 units 247 (H)  Results for RUSTIN, ERHART (MRN [id number]) as of 07/25/2019 09:46  Ref. Range 07/19/2019 19:52  Glucose Latest Ref Range: 70 - 99 mg/dL 417 (H)   Results for ALEXAVIER, TSUTSUI (MRN [id number]) as of 07/25/2019 09:46  Ref. Range 06/01/2017 17:41 07/25/2019 04:40  Hemoglobin A1C Latest Ref Range: 4.8 - 5.6 % 9.1 (H) 10.7 (H)   Review of Glycemic Control  Diabetes history: DM2 Outpatient Diabetes medications: Toujeo 20 units BID, Humalog 5 units with meals plus sliding scale for hyperglycemia Current orders for Inpatient glycemic control: Levemir 8 units BID, Novolog 0-9 units TID with meals, Novolog 0-5 units QHS  Inpatient Diabetes Program Recommendations:   HbgA1C: A1C 10.7% on 07/25/19 indicating an average glucose of 260 mg/dl over the past 2-3 months.  Addendum 07/25/19@14 :30-Spoke with patient about diabetes and home regimen for diabetes control. Patient reports being followed by PCP for diabetes management and currently taking Toujeo 20 units BID and Humalog 5 units with meals plus correction scale.  Patient states that he only checks glucose once a day in the morning.  Patient notes that last week he got a steroid injection and was started on Prednisone 7 day taper. Patient admits that since he was on the steroids, he was not checking the glucose at all because he knew it would be elevated.  Patient reports that his last A1C was in 9% range.  Discussed A1C results (10.7% on 07/25/19 ) and explained that current A1C  indicates an average glucose of 260 mg/dl over the past 2-3 months. Explained that current A1C likely increased from last A1C due to recent steroids. Patient reports that he rarely has an issues with hypoglycemia but when it has occurred it is usually during the night or upon early awakening.  Discussed glucose and A1C goals. Discussed importance of checking CBGs and maintaining good CBG control to prevent long-term and short-term complications. Stressed to the patient the importance of improving glycemic control to prevent further complications from uncontrolled diabetes. Discussed impact of nutrition, exercise, stress, sickness, and medications on diabetes control.  Discussed Toujeo and Humalog and how each insulin works. Explained that if he were to try to check glucose more frequently each day (2-3 times) then the doctor could use the information to help make adjustments to get DM under better control. Also discussed that when he is taking Prednisone it is even more important to check glucose so that insulin can be adjusted by his doctor if needed. Patient is concerned about whether he should take the same amount of insulin at home since he has lost weight as he notes that when he has lost weight in the past he tends to have more hypoglycemia if insulin is not adjusted. Informed patient that I would make note of his concern and if the doctor feels that insulin changes need to be made, they would adjust on discharge instructions. Also discussed hypoglycemia and acute issues that could result from hypoglycemic  episode especially given patient's age. Encouraged patient to check glucose anytime he feels symptoms of hypoglycemia and be sure to reach out to his PCP to let them know and see if insulin changes are needed.  Encouraged patient to check glucose at least 2-3 times per day.  Explained how PCP can use the glucose values to continue to make adjustments with DM medications if needed.  Discussed [facility] consult for  assistance with diabetes control and patient agreeable so [facility] should contact patient as an outpatient after discharge. Patient verbalized understanding of information discussed and reports no further questions at this time related to diabetes.  Thanks, Barnie Alderman, RN, MSN, CDE Diabetes Coordinator Inpatient Diabetes Program [phone] (Team Pager from 8am to 5pm)

## 2019-07-25 NOTE — Progress Notes (Signed)
RT attempted to place patient on a nasal cannula. After minutes of being off bipap, patient asked to be placed back on. RT placed patient back on bipap. Patient states that he feels more comfortable on bipap. RN aware. RT will continue to monitor.

## 2019-07-25 NOTE — Significant Event (Signed)
Rapid Response Event Note  Overview: Respiratory Distress and Chest Pain  Initial Focused Assessment: Called by nurse with concerns of patient having increased shortness breath, increased oxygen needs, and increased pleuritic chest pain. Nurse had already paged the MD on call - received orders for BIPAP. I asked the nurse to obtain an EKG and have the RT place on the BIPAP. Upon arrival, patient was laying down in the bed, staff obtained an EKG, and I asked that pull the patient up in bed. Patient was short of breath, able to say a few words but was in moderate distress. He endorsed having some intense pleuritic chest pain at first, patient was coughing some when it started he said. Patient was originally on 3L [place], nurse had to increase the oxygen to HFNC 10L and saturations were 94% on HFNC 10L when I arrived, RR was in the mid 72s. Lung - scattered rhonchi but overall good air movement. HR 80s - EKG - overall looks okay. BIPAP started and after 2-3 minutes, patient's RR improved to the mid 20s, patient was no longer working hard to breath and he stated he felt better and that his chest pain was gone.   I paged TRH MD - updated him - I ordered a STAT CXR and asked that the patient have one time dose of pain medication ordered just incase the pain came back. Morphine 1 mg IV x once ordered but not given.    Interventions: -- STAT EKG -- STAT CXR -- Morphine 1 mg IV (once) - ordered -- Heat Packs applied   Plan of Care: -- BIPAP for a few hours for now to rest the patient -- NPO -- F/U with CXR -- Rest per MD -- Wean oxygen as patient tolerates  Event Summary:  Call Time 0955 Arrival Time 1002 End Time 1020  Naraya Stoneberg R

## 2019-07-25 NOTE — H&P (Signed)
History and Physical    Erik Logan YSA:[id number] DOB: 11/29/1928 DOA: 07/18/2019  PCP: Neale Burly, MD   Patient coming from: Home   Chief Complaint: SOB, chest pain   HPI: Erik Logan is a 86 y.o. male with medical history significant for insulin-dependent diabetes mellitus, hypertension, coronary artery disease, chronic diastolic CHF, and chronic kidney disease stage IV, now presenting to the emergency department for evaluation of shortness of breath and chest pain.  Patient reports that he had been experiencing increased dyspnea for months, had bilateral lower extremity swelling, doubled his Lasix dose on his own a couple weeks ago with resolution in his leg swelling and a several pound weight loss, was also treated with prednisone recently, but went on to develop worsening shortness of breath with a new cough over the past few days and then chest pain.  He describes chest discomfort localized to the right side, worse with cough or deep inspiration, and better if he stays still.  There has not been any recurrent leg swelling and he denies any leg tenderness.  He denies hemoptysis.  He denies orthopnea.  ED Course: Upon arrival to the ED, patient is found to be afebrile, saturating upper 80s on room air while at rest, mildly tachypneic, and with stable blood pressure.  EKG features sinus rhythm with first-degree AV nodal block.  Chest x-ray with mild atelectasis or infiltrate along the lateral aspect of the right lung.  Chemistry panel notable for glucose 417 and creatinine 1.85, up from 1.59 in December.  CBC features a leukocytosis to 24,300 and a slight thrombocytopenia.  Troponin was elevated to 105, then down to 82 when repeated.  BNP is elevated to 213.  Lactic acid is 2.0.  Covid and influenza PCR are negative.  Blood cultures were collected in the ED, 500 cc of saline was administered, and the patient was started on Rocephin, azithromycin, and supplemental oxygen.  Review of  Systems:  All other systems reviewed and apart from HPI, are negative.  Past Medical History:  Diagnosis Date  . Chronic renal insufficiency, stage 2 (mild)   . Coronary artery disease   . History of duodenal ulcer 1970s  . Hyperlipidemia   . Hypertension   . NSTEMI (non-ST elevated myocardial infarction) ([place]) 06/01/2017  . SCC (squamous cell carcinoma) Well Diff 10/10/2015   Left Wrist (Cx3,5FU)  . Squamous cell carcinoma in situ (SCCIS) 10/10/2015   Left Arm (Cx3,5FU)  . Squamous cell carcinoma in situ (SCCIS) x3 08/25/2018   Left Hand Medial, Left Hand Lateral and Left Arm  . Type II diabetes mellitus ([place])     Past Surgical History:  Procedure Laterality Date  . CATARACT EXTRACTION W/ INTRAOCULAR LENS IMPLANT Left   . CORONARY ANGIOPLASTY WITH STENT PLACEMENT  06/02/2017  . CORONARY BALLOON ANGIOPLASTY N/A 06/02/2017   Procedure: CORONARY BALLOON ANGIOPLASTY;  Surgeon: Troy Sine, MD;  Location: [place] [facility];  Service: Cardiovascular;  Laterality: N/A;  . CORONARY STENT INTERVENTION N/A 06/02/2017   Procedure: CORONARY STENT INTERVENTION;  Surgeon: Troy Sine, MD;  Location: [place] [facility];  Service: Cardiovascular;  Laterality: N/A;  . EYE SURGERY Right 1950s   "S/P injury in ~ 1942 which ; put a hole in the retina; can't see out of it; cataract developed; surgery to remove cataract in 1950  . LEFT HEART CATH AND CORONARY ANGIOGRAPHY N/A 06/02/2017   Procedure: LEFT HEART CATH AND CORONARY ANGIOGRAPHY;  Surgeon: Troy Sine, MD;  Location: [place] [facility];  Service: Cardiovascular;  Laterality: N/A;  . TONSILLECTOMY  1930s     reports that he has quit smoking. His smoking use included cigarettes. He has a 32.00 pack-year smoking history. He has never used smokeless tobacco. He reports that he does not drink alcohol or use drugs.  Allergies  Allergen Reactions  . Beef-Derived Products Hives    Red meat  . Sulfa Antibiotics Swelling    Family  History  Problem Relation Age of Onset  . Diabetes Mother   . Hypertension Mother   . Pulmonary disease Father   . Pulmonary disease Sister   . Healthy Sister      Prior to Admission medications   Medication Sig Start Date End Date Taking? Authorizing Provider  amLODipine (NORVASC) 5 MG tablet Take 1 tablet by mouth once daily 06/06/19  Yes Branch, Alphonse Guild, MD  aspirin EC 81 MG tablet Take 1 tablet (81 mg total) by mouth daily. 06/03/17  Yes Bhagat, Bhavinkumar, PA  atorvastatin (LIPITOR) 40 MG tablet Take 1 tablet by mouth once daily 06/06/19  Yes Branch, Alphonse Guild, MD  furosemide (LASIX) 40 MG tablet Take 20-40 mg by mouth daily. Take 1/2 tablet daily, take an additional 1 tablet with noticeable swelling.   Yes [provider]  insulin lispro (HUMALOG) 100 UNIT/ML injection Inject 5 Units into the skin See admin instructions. Per Sliding Scale   Yes [provider]  isosorbide mononitrate (IMDUR) 30 MG 24 hr tablet Take 1 tablet by mouth once daily 12/24/18  Yes Branch, Alphonse Guild, MD  losartan (COZAAR) 25 MG tablet Take 1 tablet (25 mg total) by mouth daily. 07/05/19 10/03/19 Yes Verta Ellen., NP  metoprolol tartrate (LOPRESSOR) 25 MG tablet Take 0.5 tablets (12.5 mg total) by mouth daily. 06/03/17  Yes Bhagat, Bhavinkumar, PA  nitroGLYCERIN (NITROSTAT) 0.4 MG SL tablet DISSOLVE ONE TABLET UNDER THE TONGUE EVERY 5 MINUTES AS NEEDED FOR CHEST PAIN.  DO NOT EXCEED A TOTAL OF 3 DOSES IN 15 MINUTES Patient taking differently: Place 0.4 mg under the tongue every 5 (five) minutes as needed for chest pain. Do not exceed 3 doses in 15 minutes. 07/09/18  Yes BranchAlphonse Guild, MD  omeprazole (PRILOSEC) 20 MG capsule Take 20 mg by mouth daily as needed (Heartburn).    Yes [provider]  Polyethyl Glycol-Propyl Glycol (SYSTANE OP) Apply 2 drops to eye daily as needed (Dry eyes).   Yes [provider]  TOUJEO SOLOSTAR 300 UNIT/ML SOPN Inject 20 Units into the  skin 2 (two) times daily.  03/25/18  Yes [provider]    Physical Exam: Vitals:   07/25/19 0000 07/25/19 0100 07/25/19 0115 07/25/19 0130  BP: 130/66 113/60 117/61 (!) 106/58  Pulse: 75 70 68 65  Resp: 17 (!) 23    Temp:      TempSrc:      SpO2: 95% (!) 89% 91% 90%  Weight:      Height:        Constitutional: NAD, calm  Eyes: PERTLA, lids and conjunctivae normal ENMT: Mucous membranes are moist. Posterior pharynx clear of any exudate or lesions.   Neck: normal, supple, no masses, no thyromegaly Respiratory:  Mild tachypnea, egophony on the right. No wheezing, no crackles. No pallor or cyanoisis.  Cardiovascular: S1 & S2 heard, regular rate and rhythm. No extremity edema.   Abdomen: No distension, no tenderness, soft. Bowel sounds active.  Musculoskeletal: no clubbing / cyanosis. No  joint deformity upper and lower extremities.   Skin: no significant rashes, lesions, ulcers. Warm, dry, well-perfused. Neurologic: No facial asymmetry. Sensation intact. Moving all extremities.  Psychiatric: Alert and oriented to person, place, and situation. Very pleasant and cooperative.    Labs and Imaging on Admission: I have personally reviewed following labs and imaging studies  CBC: Recent Labs  Lab 08/13/2019 1952  WBC 24.3*  HGB 15.4  HCT 46.5  MCV 89.3  PLT 741*   Basic Metabolic Panel: Recent Labs  Lab 07/23/2019 1952  NA 134*  K 4.9  CL 101  CO2 21*  GLUCOSE 417*  BUN 34*  CREATININE 1.85*  CALCIUM 8.7*   GFR: Estimated Creatinine Clearance: 29.9 mL/min (A) (by C-G formula based on SCr of 1.85 mg/dL (H)). Liver Function Tests: No results for input(s): AST, ALT, ALKPHOS, BILITOT, PROT, ALBUMIN in the last 168 hours. No results for input(s): LIPASE, AMYLASE in the last 168 hours. No results for input(s): AMMONIA in the last 168 hours. Coagulation Profile: Recent Labs  Lab 08/08/2019 2241  INR 1.3*   Cardiac Enzymes: No results for input(s): CKTOTAL, CKMB,  CKMBINDEX, TROPONINI in the last 168 hours. BNP (last 3 results) No results for input(s): PROBNP in the last 8760 hours. HbA1C: No results for input(s): HGBA1C in the last 72 hours. CBG: No results for input(s): GLUCAP in the last 168 hours. Lipid Profile: No results for input(s): CHOL, HDL, LDLCALC, TRIG, CHOLHDL, LDLDIRECT in the last 72 hours. Thyroid Function Tests: No results for input(s): TSH, T4TOTAL, FREET4, T3FREE, THYROIDAB in the last 72 hours. Anemia Panel: No results for input(s): VITAMINB12, FOLATE, FERRITIN, TIBC, IRON, RETICCTPCT in the last 72 hours. Urine analysis: No results found for: COLORURINE, APPEARANCEUR, LABSPEC, PHURINE, GLUCOSEU, HGBUR, BILIRUBINUR, KETONESUR, PROTEINUR, UROBILINOGEN, NITRITE, LEUKOCYTESUR Sepsis Labs: @LABRCNTIP (procalcitonin:4,lacticidven:4) ) Recent Results (from the past 240 hour(s))  Respiratory Panel by RT PCR (Flu A&B, Covid) - Nasopharyngeal Swab     Status: None   Collection Time: 07/21/2019 11:08 PM   Specimen: Nasopharyngeal Swab  Result Value Ref Range Status   SARS Coronavirus 2 by RT PCR NEGATIVE NEGATIVE Final    Comment: (NOTE) SARS-CoV-2 target nucleic acids are NOT DETECTED. The SARS-CoV-2 RNA is generally detectable in upper respiratoy specimens during the acute phase of infection. The lowest concentration of SARS-CoV-2 viral copies this assay can detect is 131 copies/mL. A negative result does not preclude SARS-Cov-2 infection and should not be used as the sole basis for treatment or other patient management decisions. A negative result may occur with  improper specimen collection/handling, submission of specimen other than nasopharyngeal swab, presence of viral mutation(s) within the areas targeted by this assay, and inadequate number of viral copies (<131 copies/mL). A negative result must be combined with clinical observations, patient history, and epidemiological information. The expected result is Negative. Fact  Sheet for Patients:  PinkCheek.be Fact Sheet for Healthcare Providers:  GravelBags.it This test is not yet ap proved or cleared by the [place] FDA and  has been authorized for detection and/or diagnosis of SARS-CoV-2 by FDA under an Emergency Use Authorization (EUA). This EUA will remain  in effect (meaning this test can be used) for the duration of the COVID-19 declaration under Section 564(b)(1) of the Act, 21 U.S.C. section 360bbb-3(b)(1), unless the authorization is terminated or revoked sooner.    Influenza A by PCR NEGATIVE NEGATIVE Final   Influenza B by PCR NEGATIVE NEGATIVE Final    Comment: (NOTE) The Xpert Xpress SARS-CoV-2/FLU/RSV assay  is intended as an aid in  the diagnosis of influenza from Nasopharyngeal swab specimens and  should not be used as a sole basis for treatment. Nasal washings and  aspirates are unacceptable for Xpert Xpress SARS-CoV-2/FLU/RSV  testing. Fact Sheet for Patients: PinkCheek.be Fact Sheet for Healthcare Providers: GravelBags.it This test is not yet approved or cleared by the [place] FDA and  has been authorized for detection and/or diagnosis of SARS-CoV-2 by  FDA under an Emergency Use Authorization (EUA). This EUA will remain  in effect (meaning this test can be used) for the duration of the  Covid-19 declaration under Section 564(b)(1) of the Act, 21  U.S.C. section 360bbb-3(b)(1), unless the authorization is  terminated or revoked. Performed at [facility], [place] [address], [place], [place] [address]      Radiological Exams on Admission: DG Chest 2 View  Result Date: 07/18/2019 CLINICAL DATA:  Left-sided chest pain. EXAM: CHEST - 2 VIEW COMPARISON:  March 16, 2019 FINDINGS: Mild-to-moderate severity chronic appearing increased interstitial lung markings are noted bilaterally. Mild atelectasis and/or  infiltrate is seen along the lateral aspect of the mid right lung. There is no evidence of a pleural effusion or pneumothorax. The heart size and mediastinal contours are within normal limits. Multilevel degenerative changes seen throughout the thoracic spine. IMPRESSION: 1. Mild atelectasis and/or infiltrate along the lateral aspect of the mid right lung. 2. Chronic appearing increased interstitial lung markings. Electronically Signed   By: Virgina Norfolk M.D.   On: 07/23/2019 20:47    EKG: Independently reviewed. Sinus rhythm, 1st degree AV block.   Assessment/Plan   1. Pneumonia; acute hypoxic respiratory failure  - Presents with SOB, cough, and pleuritic pain on right, is found to have new 2 Lpm supplemental O2 requirement, marked leukocytosis (had been on prednisone recently), and suspected right-sided PNA on CXR  - Blood cultures were collected in ED and he was started on supplemental O2, Rocephin, and azithromycin  - Trend procalcitionin, check sputum culture and strep pneumo and legionella antigens, continue current antibiotics, and continue supplemental O2 as needed    2. Chest pain; CAD  - Presents with pleuritic pain on right side and SOB, has mild troponin elevation, but sxs most consistent with pneumonia  - Continue ASA, statin, metoprolol, and Imdur    3. Chronic diastolic CHF  - Appears compensated  - He had recently doubled his Lasix with resolution in LE edema and several pound wt-loss  - SCr is up, there is no peripheral edema or congestion on CXR, and he was given 500 cc IVF bolus in ED  - Hold Lasix and losartan initially, continue beta-blocker as tolerated, monitor daily wt and I/Os    4. Insulin-dependent DM  - Serum glucose is 417 in ED without DKA  - Check CBGs, continue basal and short-acting insulins    5. CKD IV  - SCr is 1.85 in ED, up from 1.59 a few months ago  - Increased SCr likely secondary to recent increase in Lasix dose  - He was given 500 cc NS in  ED  - Hold Lasix and losartan initially, renally-dose medications, monitor with daily chem panel   6. Hypertension  - Hold losartan initially given increased creatinine and continue metoprolol and Norvasc as tolerated     DVT prophylaxis: sq heparin  Code Status: DNR, confirmed with patient on admission  Family Communication: Discussed with patient  Disposition Plan:  Patient is from: Home  Anticipated d/c is to:  Home  Anticipated d/c date is: 07/28/19 Patient currently: Has new supplemental O2 requirement and should be managed inpatient in light of high PORT/PSI score  Consults called: None  Admission status: Inpatient     Vianne Bulls, MD [facility] Pager: See [email or web address]  If 7AM-7PM, please contact the daytime attending [email or web address]  07/25/2019, 1:57 AM

## 2019-07-25 NOTE — Consult Note (Signed)
   [facility] Inpatient Consult   07/25/2019  Erik Logan [birth year] [id number]  Hospital Referral:  Diabetic Coordinator/Diabetes [facility] ACO Patient:  [name]   Referral request received for follow up for diabetes.  Will follow up with patient and inpatient Transition of care [TOC]  team members for disposition and needs.  Will follow patient for progress and disposition needs.  07/26/2019 1:57 pm Follow up:  Reviewed patient's medical record for post hospital follow up needs and also reached out to the inpatient [facility] RNCM regarding hospital follow up referral.  Attempts to reach the patient by phone was not successful.  However, will plan for post hospital out reach.  Will continue to follow for progress and attempt additional outreach hopefully prior to his transition/disposition.  Of note, [facility] Care Management does not replace or interfere with any services needed or arranged by the inpatient [facility] staff.  For questions, please contact:  Natividad Brood, RN BSN [facility] Liaison  [phone] business mobile phone Toll free office [phone]  Fax number: [phone] [email or web address]    Natividad Brood, RN BSN [facility] Liaison  [phone] business mobile phone Toll free office [phone]  Fax number: [phone] [email or web address]

## 2019-07-25 NOTE — Progress Notes (Signed)
PROGRESS NOTE    Erik Logan  YNW:[id number] DOB: 21-Nov-1928 DOA: 07/21/2019 PCP: Neale Burly, MD    Brief Narrative:  Patient was admitted to the hospital with a working diagnosis of acute hypoxic respiratory failure due to multifocal community-acquired pneumonia.  86 year old male with significant past medical history for hypertension, type 2 diabetes mellitus, coronary artery disease, chronic diastolic heart failure, and chronic kidney disease stage IV.  Patient reported worsening dyspnea for several weeks, at one point it was associated with lower extremity edema that transiently improved with increased dose of furosemide.  Due to persistent cough he received a short course of prednisone, with no significant improvement of his symptoms, he developed pleuritic chest pain mainly on the right side.  On his initial physical examination his oximetry was 80% on room air, blood pressure was 130/66, heart rate 75 and respiratory 23, his lungs had no significant wheezing or rales, heart S1-S2 present and rhythmic, tachycardic, abdomen soft, no lower extremity edema. Sodium 134, potassium 4.9, chloride 1 1, bicarb 21, glucose 417, BUN 34, creatinine 1.5.  Troponin I 105.  4.3, hemoglobin 15.4, hematocrit 46.5, platelets 146.  SARS Covid 19 negative.  Chest radiograph with multifocal interstitial infiltrates, all 4 quadrants.  EKG 81 bpm, left axis deviation, left anterior fascicular block, sinus rhythm, no ST segment or T wave changes.   Assessment & Plan:   Principal Problem:   Pneumonia Active Problems:   Essential (primary) hypertension   Type 2 diabetes mellitus without complication, with long-term current use of insulin (HCC)   CKD (chronic kidney disease) stage 4, GFR 15-29 ml/min (HCC)   Chronic diastolic CHF (congestive heart failure) (HCC)   Acute respiratory failure with hypoxia ([place])   1. Acute hypoxic respiratory failure due to multifocal community acquired pneumonia. Patient  with worsening dyspnea and hypoxemia this am, despite 6 L/ min per [place] of supplemental 02. Placed on non invasive mechanical ventilation 10/5 with 60% Fi02, with improvement of his symptoms. Follow up chest film with persistent bilateral infiltrates. Wbc is 24 and BNP is 713.   Will continue non invasive mechanical ventilation, keep oxygen saturation more tan 92%. Currently his Vt are about 1000 ml. Will continue antibiotic therapy with ceftriaxone and azithromycin, follow with cultures and cell count. Will give one dose of furosemide 60 IV x1 and follow response.  Will scheduled acetaminophen for pleuritic chest pain and will add IV morphine for severe pain as needed.   Patient with high risk for worsening respiratory failure.   2. Acute on chronic diastolic heart failure. Echocardiogram from 2019 with preserved LV systolic function 55 to 65%. Blood pressure 784 mmHg systolic. Elevated troponin I due to volume overload, no clinical signs of acute coronary syndrome.   Will continue blood pressure control with amlodipine, isosorbide and metoprolol. Will order one dose of IV furosemide and follow urine output.   3. AKI on CKD stage IV with non gap metabolic acidosis. Renal function with serum cr at 1,88 with K at 3,7 and serum bicarbonate at 19. Suspected component of volume overload.  Will order one dose of furosemide and follow on renal panel in am.   4. T2DM/ dyslipidemia. Will continue glucose cover and monitoring with insulin sliding scale. Basal insulin 8 units bid. Continue with atorvastatin.   5. CAD. No clinical signs of acute coronary syndrome, chest pain seems to be more pleuritic. Troponin I elevation in the setting of decompensated diastolic heart failure. Will continue with asa and atorvastatin.  Status is: Inpatient  Remains inpatient appropriate because:IV treatments appropriate due to intensity of illness or inability to take PO   Dispo: The patient is from: Home               Anticipated d/c is to: Home              Anticipated d/c date is: 3 days              Patient currently is not medically stable to d/c.        DVT prophylaxis: Enoxaparin   Code Status:   dnr   Family Communication:  No family at the bedside      Nutrition Status:          Antimicrobials:   Ceftriaxone and azithromycin     Subjective: Patient this am with severe dyspnea, severe chest pain, no nausea or vomiting, symptoms improved with bipap.   Objective: Vitals:   07/25/19 0430 07/25/19 0807 07/25/19 0950 07/25/19 1014  BP: (!) 190/78 (!) 156/72 (!) 178/84   Pulse:  76 82 89  Resp: (!) 22 19  (!) 27  Temp: 98.1 F (36.7 C) 98.5 F (36.9 C)    TempSrc: Oral     SpO2: 97% 92% 90% 99%  Weight: 84.6 kg     Height: 5\' 8"  (1.727 m)       Intake/Output Summary (Last 24 hours) at 07/25/2019 1105 Last data filed at 07/25/2019 0010 Gross per 24 hour  Intake 100 ml  Output --  Net 100 ml   Filed Weights   08/12/2019 1930 07/25/19 0430  Weight: 87.2 kg 84.6 kg    Examination:   General: deconditioned and ill looking appearing  Neurology: Awake and alert, non focal  E ENT: mild pallor, no icterus, oral mucosa moist Cardiovascular: No JVD. S1-S2 present, rhythmic, no gallops, rubs, or murmurs. No lower extremity edema. Pulmonary:  positive breath sounds bilaterally,decreased air movement, no wheezing, scattered rhonchi and rales bilaterally . Gastrointestinal. Abdomen with no organomegaly, non tender, no rebound or guarding Skin. No rashes Musculoskeletal: no joint deformities     Data Reviewed: I have personally reviewed following labs and imaging studies  CBC: Recent Labs  Lab 08/14/2019 1952 07/25/19 0440  WBC 24.3* 24.9*  NEUTROABS  --  21.4*  HGB 15.4 14.8  HCT 46.5 44.9  MCV 89.3 89.4  PLT 146* 865*   Basic Metabolic Panel: Recent Labs  Lab 08/12/2019 1952 07/25/19 0440  NA 134* 136  K 4.9 3.7  CL 101 106  CO2 21* 19*  GLUCOSE 417* 350*   BUN 34* 37*  CREATININE 1.85* 1.88*  CALCIUM 8.7* 8.3*   GFR: Estimated Creatinine Clearance: 28.8 mL/min (A) (by C-G formula based on SCr of 1.88 mg/dL (H)). Liver Function Tests: No results for input(s): AST, ALT, ALKPHOS, BILITOT, PROT, ALBUMIN in the last 168 hours. No results for input(s): LIPASE, AMYLASE in the last 168 hours. No results for input(s): AMMONIA in the last 168 hours. Coagulation Profile: Recent Labs  Lab 08/01/2019 2241  INR 1.3*   Cardiac Enzymes: No results for input(s): CKTOTAL, CKMB, CKMBINDEX, TROPONINI in the last 168 hours. BNP (last 3 results) No results for input(s): PROBNP in the last 8760 hours. HbA1C: Recent Labs    07/25/19 0440  HGBA1C 10.7*   CBG: Recent Labs  Lab 07/25/19 0306 07/25/19 0808  GLUCAP 368* 247*   Lipid Profile: No results for input(s): CHOL, HDL, LDLCALC, TRIG, CHOLHDL, LDLDIRECT in the  last 72 hours. Thyroid Function Tests: No results for input(s): TSH, T4TOTAL, FREET4, T3FREE, THYROIDAB in the last 72 hours. Anemia Panel: No results for input(s): VITAMINB12, FOLATE, FERRITIN, TIBC, IRON, RETICCTPCT in the last 72 hours.    Radiology Studies: I have reviewed all of the imaging during this hospital visit personally     Scheduled Meds: . amLODipine  5 mg Oral Daily  . aspirin EC  81 mg Oral Daily  . atorvastatin  40 mg Oral Daily  . heparin  5,000 Units Subcutaneous Q8H  . insulin aspart  0-5 Units Subcutaneous QHS  . insulin aspart  0-9 Units Subcutaneous TID WC  . insulin detemir  8 Units Subcutaneous BID  . isosorbide mononitrate  30 mg Oral Daily  . mouth rinse  15 mL Mouth Rinse BID  . metoprolol tartrate  12.5 mg Oral Daily  .  morphine injection  1 mg Intravenous Once  . pantoprazole  40 mg Oral Daily  . sodium chloride flush  3 mL Intravenous Once  . sodium chloride flush  3 mL Intravenous Q12H  . sodium chloride flush  3 mL Intravenous Q12H   Continuous Infusions: . sodium chloride    .  azithromycin Stopped (07/25/19 0051)  . cefTRIAXone (ROCEPHIN)  IV       LOS: 0 days        Esteven Overfelt Gerome Apley, MD

## 2019-07-26 ENCOUNTER — Inpatient Hospital Stay (HOSPITAL_COMMUNITY): Payer: Medicare Other

## 2019-07-26 DIAGNOSIS — R0602 Shortness of breath: Secondary | ICD-10-CM

## 2019-07-26 LAB — CBC WITH DIFFERENTIAL/PLATELET
Abs Immature Granulocytes: 0.27 10*3/uL — ABNORMAL HIGH (ref 0.00–0.07)
Basophils Absolute: 0.1 10*3/uL (ref 0.0–0.1)
Basophils Relative: 0 %
Eosinophils Absolute: 0 10*3/uL (ref 0.0–0.5)
Eosinophils Relative: 0 %
HCT: 42.1 % (ref 39.0–52.0)
Hemoglobin: 13.9 g/dL (ref 13.0–17.0)
Immature Granulocytes: 1 %
Lymphocytes Relative: 6 %
Lymphs Abs: 1.7 10*3/uL (ref 0.7–4.0)
MCH: 29.2 pg (ref 26.0–34.0)
MCHC: 33 g/dL (ref 30.0–36.0)
MCV: 88.4 fL (ref 80.0–100.0)
Monocytes Absolute: 1.5 10*3/uL — ABNORMAL HIGH (ref 0.1–1.0)
Monocytes Relative: 5 %
Neutro Abs: 24.2 10*3/uL — ABNORMAL HIGH (ref 1.7–7.7)
Neutrophils Relative %: 88 %
Platelets: 141 10*3/uL — ABNORMAL LOW (ref 150–400)
RBC: 4.76 MIL/uL (ref 4.22–5.81)
RDW: 13.4 % (ref 11.5–15.5)
WBC: 27.8 10*3/uL — ABNORMAL HIGH (ref 4.0–10.5)
nRBC: 0 % (ref 0.0–0.2)

## 2019-07-26 LAB — BASIC METABOLIC PANEL
Anion gap: 14 (ref 5–15)
BUN: 39 mg/dL — ABNORMAL HIGH (ref 8–23)
CO2: 19 mmol/L — ABNORMAL LOW (ref 22–32)
Calcium: 8.3 mg/dL — ABNORMAL LOW (ref 8.9–10.3)
Chloride: 108 mmol/L (ref 98–111)
Creatinine, Ser: 1.99 mg/dL — ABNORMAL HIGH (ref 0.61–1.24)
GFR calc Af Amer: 34 mL/min — ABNORMAL LOW (ref >60)
GFR calc non Af Amer: 29 mL/min — ABNORMAL LOW (ref >60)
Glucose, Bld: 82 mg/dL (ref 70–99)
Potassium: 3.7 mmol/L (ref 3.5–5.1)
Sodium: 141 mmol/L (ref 135–145)

## 2019-07-26 LAB — GLUCOSE, CAPILLARY
Glucose-Capillary: 58 mg/dL — ABNORMAL LOW (ref 70–99)
Glucose-Capillary: 151 mg/dL — ABNORMAL HIGH (ref 70–99)
Glucose-Capillary: 116 mg/dL — ABNORMAL HIGH (ref 70–99)
Glucose-Capillary: 198 mg/dL — ABNORMAL HIGH (ref 70–99)
Glucose-Capillary: 272 mg/dL — ABNORMAL HIGH (ref 70–99)

## 2019-07-26 LAB — ECHOCARDIOGRAM COMPLETE
Height: 68 in
Weight: 2984.15 oz

## 2019-07-26 MED ORDER — IPRATROPIUM-ALBUTEROL 0.5-2.5 (3) MG/3ML IN SOLN
3.0000 mL | Freq: Four times a day (QID) | RESPIRATORY_TRACT | Status: DC
  Administered 2019-07-26: 3 mL via RESPIRATORY_TRACT
  Filled 2019-07-26: qty 3

## 2019-07-26 MED ORDER — IPRATROPIUM-ALBUTEROL 0.5-2.5 (3) MG/3ML IN SOLN: 3.0000 mL | RESPIRATORY_TRACT | Status: DC | PRN

## 2019-07-26 MED ORDER — MOMETASONE FURO-FORMOTEROL FUM 200-5 MCG/ACT IN AERO
2.0000 | INHALATION_SPRAY | Freq: Two times a day (BID) | RESPIRATORY_TRACT | Status: DC
  Administered 2019-07-27 – 2019-07-31 (×8): 2 via RESPIRATORY_TRACT
  Filled 2019-07-26: qty 8.8

## 2019-07-26 MED ORDER — ACETAMINOPHEN 325 MG PO TABS
650.0000 mg | ORAL_TABLET | Freq: Four times a day (QID) | ORAL | Status: DC
  Administered 2019-07-26 – 2019-07-29 (×12): 650 mg via ORAL
  Filled 2019-07-26 (×13): qty 2

## 2019-07-26 NOTE — Progress Notes (Signed)
Echocardiogram 2D Echocardiogram has been performed.  Oneal Deputy Jezebel Pollet 07/26/2019, 1:01 PM

## 2019-07-26 NOTE — Progress Notes (Signed)
PROGRESS NOTE    Erik Logan  ONG:[id number] DOB: May 13, 1928 DOA: 08/03/2019 PCP: Neale Burly, MD    Brief Narrative:  Patient was admitted to the hospital with a working diagnosis of acute hypoxic respiratory failure due to multifocal community-acquired pneumonia (present on admission).  86 year old male with significant past medical history for hypertension, type 2 diabetes mellitus, coronary artery disease, chronic diastolic heart failure, and chronic kidney disease stage IV.  Patient reported worsening dyspnea for several weeks, at one point it was associated with lower extremity edema that transiently improved with increased dose of furosemide.  Due to persistent cough he received a short course of prednisone, with no significant improvement of his symptoms, he developed pleuritic chest pain mainly on the right side.  On his initial physical examination his oximetry was 80% on room air, blood pressure was 130/66, heart rate 75 and respiratory 23, his lungs had no significant wheezing or rales, heart S1-S2 present and rhythmic, tachycardic, abdomen soft, no lower extremity edema. Sodium 134, potassium 4.9, chloride 1 1, bicarb 21, glucose 417, BUN 34, creatinine 1.5.  Troponin I 105.  4.3, hemoglobin 15.4, hematocrit 46.5, platelets 146.  SARS Covid 19 negative.  Chest radiograph with multifocal interstitial infiltrates, all 4 quadrants.  EKG 81 bpm, left axis deviation, left anterior fascicular block, first degree AV block, sinus rhythm, no ST segment or T wave changes.   Patient developed respiratory distress and required non invasive mechanical ventilation and furosemide with improvement in his symptoms. Continue with broad spectrum antibiotic therapy.    Assessment & Plan:   Principal Problem:   Pneumonia Active Problems:   Essential (primary) hypertension   Type 2 diabetes mellitus without complication, with long-term current use of insulin (HCC)   CKD (chronic kidney disease)  stage 4, GFR 15-29 ml/min (HCC)   Chronic diastolic CHF (congestive heart failure) (HCC)   Acute respiratory failure with hypoxia ([place])   1. Acute hypoxic respiratory failure due to multifocal community acquired pneumonia.  Patient's dyspnea has improved, now off non invasive mechanical ventilation, on 8 L/min of HFNC with oxygenation of 92%. Wbc continue to be elevated at 27 from 24. Blood cultures with no growth.   Antibiotic therapy with IV ceftriaxone and oral azithromycin, continue with cheduled acetaminophen for pleuritic chest pain/ as needed morphine for severe pain. Continue oxymetry monitoring and target oxygen saturation more than 92%. Will add bronchodilator therapy with duoneb, inhaled corticosteroid and long acting B agonist, patient former heavy smoker, he has been diagnosed with COPD in recent hospitalization, will hold on systemic steroids for now. Out of bed to chair tid with meals, PT and OT evaluation.   Patient continue at with high risk for worsening respiratory failure.   2. Acute on chronic diastolic heart failure. Echocardiogram from 2019 with preserved LV systolic function 55 to 44%. Blood pressure 010 mmHg systolic with improved volume status this am.  Plan for new echocardiogram, hold on further diuresis for now, continue blood pressure control with metoprolol and isosorbide.    3. AKI on CKD stage IV with non gap metabolic acidosis. Renal function with serum cr at 1,99 with K at 3,7 and serum bicarbonate at 19. Urine output over last 24 H is 1,075 ml.   Continue to hold on furosemide for now, will continue close follow up of renal function and electrolytes. Avoid hypotension and nephrotoxic medications.   4. T2DM/ hypoglycemia/ dyslipidemia. Glucose this am low to 51 and 82, will hold on basal insulin and  will continue insulin sliding scale for glucose cover and monitoring.   Will advance diet today heart healthy and diabetic prudent.     5. CAD. No  clinical signs of acute coronary syndrome, chest pain seems to be more pleuritic. Troponin I elevation in the setting of decompensated diastolic heart failure.   Follow up EKG with no ischemic changes, chest pain seems to be more pleuritic in nature and related to pneumonia. Continue with isosorbide.   6. HTN. Continue blood pressure control with isosorbide and metoprolol.   Status is: Inpatient  Remains inpatient appropriate because:IV treatments appropriate due to intensity of illness or inability to take PO   Dispo: The patient is from: Home              Anticipated d/c is to: Home              Anticipated d/c date is: 3 days              Patient currently is not medically stable to d/c.        DVT prophylaxis: Enoxaparin   Code Status:   dnr   Family Communication: I spoke over the phone with the patient's son about patient's  condition, plan of care, prognosis and all questions were addressed.      Antimicrobials:   ceftriaxone and azithromycin     Subjective: Patient continue to have dyspnea and chest pain, improved but not yet back to baseline, no nausea or vomiting. Feeling dizzy this am.   Objective: Vitals:   07/25/19 2000 07/25/19 2100 07/25/19 2306 07/26/19 0731  BP:   113/65 134/60  Pulse:   76 73  Resp:      Temp:   97.9 F (36.6 C) (!) 97.5 F (36.4 C)  TempSrc:   Oral Oral  SpO2: 94% 93% 91% 92%  Weight:      Height:        Intake/Output Summary (Last 24 hours) at 07/26/2019 0814 Last data filed at 07/26/2019 0500 Gross per 24 hour  Intake 630 ml  Output 675 ml  Net -45 ml   Filed Weights   07/19/2019 1930 07/25/19 0430  Weight: 87.2 kg 84.6 kg    Examination:   General: Not in pain, mild dyspnea at rest, deconditioned  Neurology: Awake and alert, non focal  E ENT: mild pallor, no icterus, oral mucosa moist Cardiovascular: No JVD. S1-S2 present, rhythmic, no gallops, rubs, or murmurs. Trace bilateral lower extremity edema. Pulmonary:  positive breath sounds bilaterally, decreased air movement, no wheezing, or rhonchi, scattered rales. Gastrointestinal. Abdomen with no organomegaly, non tender, no rebound or guarding Skin. No rashes Musculoskeletal: no joint deformities     Data Reviewed: I have personally reviewed following labs and imaging studies  CBC: Recent Labs  Lab 07/28/2019 1952 07/25/19 0440 07/26/19 0236  WBC 24.3* 24.9* 27.8*  NEUTROABS  --  21.4* 24.2*  HGB 15.4 14.8 13.9  HCT 46.5 44.9 42.1  MCV 89.3 89.4 88.4  PLT 146* 143* 323*   Basic Metabolic Panel: Recent Labs  Lab 07/23/2019 1952 07/25/19 0440 07/26/19 0236  NA 134* 136 141  K 4.9 3.7 3.7  CL 101 106 108  CO2 21* 19* 19*  GLUCOSE 417* 350* 82  BUN 34* 37* 39*  CREATININE 1.85* 1.88* 1.99*  CALCIUM 8.7* 8.3* 8.3*   GFR: Estimated Creatinine Clearance: 27.2 mL/min (A) (by C-G formula based on SCr of 1.99 mg/dL (H)). Liver Function Tests: No results for input(s): AST,  ALT, ALKPHOS, BILITOT, PROT, ALBUMIN in the last 168 hours. No results for input(s): LIPASE, AMYLASE in the last 168 hours. No results for input(s): AMMONIA in the last 168 hours. Coagulation Profile: Recent Labs  Lab 07/23/2019 2241  INR 1.3*   Cardiac Enzymes: No results for input(s): CKTOTAL, CKMB, CKMBINDEX, TROPONINI in the last 168 hours. BNP (last 3 results) No results for input(s): PROBNP in the last 8760 hours. HbA1C: Recent Labs    07/25/19 0440  HGBA1C 10.7*   CBG: Recent Labs  Lab 07/25/19 0808 07/25/19 1136 07/25/19 1546 07/25/19 1827 07/25/19 2046  GLUCAP 247* 200* 86 82 142*   Lipid Profile: No results for input(s): CHOL, HDL, LDLCALC, TRIG, CHOLHDL, LDLDIRECT in the last 72 hours. Thyroid Function Tests: No results for input(s): TSH, T4TOTAL, FREET4, T3FREE, THYROIDAB in the last 72 hours. Anemia Panel: No results for input(s): VITAMINB12, FOLATE, FERRITIN, TIBC, IRON, RETICCTPCT in the last 72 hours.    Radiology Studies: I  have reviewed all of the imaging during this hospital visit personally     Scheduled Meds: . acetaminophen  650 mg Oral Q6H  . amLODipine  5 mg Oral Daily  . aspirin EC  81 mg Oral Daily  . atorvastatin  40 mg Oral Daily  . azithromycin  500 mg Oral Daily  . heparin  5,000 Units Subcutaneous Q8H  . insulin aspart  0-5 Units Subcutaneous QHS  . insulin aspart  0-9 Units Subcutaneous TID WC  . insulin detemir  8 Units Subcutaneous BID  . isosorbide mononitrate  30 mg Oral Daily  . living well with diabetes book   Does not apply Once  . mouth rinse  15 mL Mouth Rinse BID  . metoprolol tartrate  12.5 mg Oral Daily  .  morphine injection  1 mg Intravenous Once  . pantoprazole  40 mg Oral Daily  . sodium chloride flush  3 mL Intravenous Once  . sodium chloride flush  3 mL Intravenous Q12H  . sodium chloride flush  3 mL Intravenous Q12H   Continuous Infusions: . sodium chloride    . cefTRIAXone (ROCEPHIN)  IV 2 g (07/25/19 2106)     LOS: 1 day        Eldine Rencher Gerome Apley, MD

## 2019-07-26 NOTE — Evaluation (Signed)
Physical Therapy Evaluation Patient Details Name: Erik Logan MRN: [id number] DOB: 1929-02-14 Today's Date: 07/26/2019   History of Present Illness  Patient is a 86 y/o male who presents with worsening SOB and chest pain. EKG- sinus rhythm with 1st degree AV node block. CXR 5/9- atelectasis right lung base. CXR- 5/10-bilteral infiltrates. Admitted with acute hypoxic respiratory failure secondry to PNA. On 5/10, pt requiring BiPAP due to CP and respiratory distress. PMH includes DM, HTN, NSTEMI, CAD, HLD, SCC.  Clinical Impression  Patient presents with dyspnea on exertion, decreased activity tolerance, impaired balance and impaired mobility s/p above. Pt reports being independent with ADLs/ambulation and lives with wife PTA. Reports no falls. Today, pt requires Min guard assist for transfers and gait training with use of RW for support. Sp02 dropped to 84% on 8L HFNC with 2-3/4 DOE. Education provided for pursed lip breathing, there ex and activity expectations. Encouraged walking with nursing a few times daily. Pt might require supplemental 02 if not able to wean over the next few days. Will follow acutely to maximize independence and mobility prior to return home.    Follow Up Recommendations Other (comment);Supervision for mobility/OOB(pulmonary rehab pending improvement)    Equipment Recommendations  None recommended by PT    Recommendations for Other Services       Precautions / Restrictions Precautions Precautions: Other (comment);Fall Precaution Comments: watch 02, HFNC Restrictions Weight Bearing Restrictions: No      Mobility  Bed Mobility               General bed mobility comments: Sitting EOB upon PT arrival.  Transfers Overall transfer level: Needs assistance Equipment used: Rolling walker (2 wheeled) Transfers: Sit to/from Stand Sit to Stand: Min guard         General transfer comment: Stood from EOB x1 with use of momentum and cues for hand placement.  Transferred to chair post ambulation.  Ambulation/Gait Ambulation/Gait assistance: Min guard Gait Distance (Feet): 140 Feet Assistive device: Rolling walker (2 wheeled) Gait Pattern/deviations: Step-through pattern;Decreased stride length   Gait velocity interpretation: <1.31 ft/sec, indicative of household ambulator General Gait Details: Slow, steady gait with 2-3/4 DOE. Picking up RW during turns. Sp02 dropped to 84% on 8L HFNC. Cues for pursed lip breathing, pt is a mouth breather.  Stairs            Wheelchair Mobility    Modified Rankin (Stroke Patients Only)       Balance Overall balance assessment: Needs assistance Sitting-balance support: Feet supported;No upper extremity supported Sitting balance-Leahy Scale: Good Sitting balance - Comments: Able to reach outside BoS and adjust socks without LOB   Standing balance support: During functional activity Standing balance-Leahy Scale: Poor Standing balance comment: Requires UE support in standing.                             Pertinent Vitals/Pain Pain Assessment: No/denies pain    Home Living Family/patient expects to be discharged to:: Private residence Living Arrangements: Spouse/significant other Available Help at Discharge: Family;Available 24 hours/day Type of Home: House Home Access: Level entry     Home Layout: Laundry or work area in [employer] forces training [employer] education officer (freezer down there)) Home Equipment: Environmental consultant - 2 wheels;Tub bench      Prior Function Level of Independence: Independent         Comments: Drives, does ADLs. Mows grass using ride on mower. Uses RW as needed, received it recently. No falls in  last 6 months.     Hand Dominance   Dominant Hand: Right    Extremity/Trunk Assessment   Upper Extremity Assessment Upper Extremity Assessment: Defer to OT evaluation    Lower Extremity Assessment Lower Extremity Assessment: Generalized weakness(but functional; hx of left knee issues due  to arthritis.)       Communication   Communication: HOH  Cognition Arousal/Alertness: Awake/alert Behavior During Therapy: WFL for tasks assessed/performed Overall Cognitive Status: Within Functional Limits for tasks assessed                                        General Comments      Exercises General Exercises - Lower Extremity Ankle Circles/Pumps: AROM;Both;10 reps;Seated Quad Sets: AROM;Both;10 reps;Seated   Assessment/Plan    PT Assessment Patient needs continued PT services  PT Problem List Decreased mobility;Decreased activity tolerance;Cardiopulmonary status limiting activity;Decreased balance       PT Treatment Interventions DME instruction;Therapeutic activities;Gait training;Therapeutic exercise;Patient/family education;Stair training;Balance training    PT Goals (Current goals can be found in the Care Plan section)  Acute Rehab PT Goals Patient Stated Goal: to get better and not need oxygen PT Goal Formulation: With patient Time For Goal Achievement: 08/09/19 Potential to Achieve Goals: Good    Frequency Min 3X/week   Barriers to discharge        Co-evaluation               AM-PAC PT "6 Clicks" Mobility  Outcome Measure Help needed turning from your back to your side while in a flat bed without using bedrails?: A Little Help needed moving from lying on your back to sitting on the side of a flat bed without using bedrails?: A Little Help needed moving to and from a bed to a chair (including a wheelchair)?: A Little Help needed standing up from a chair using your arms (e.g., wheelchair or bedside chair)?: A Little Help needed to walk in hospital room?: A Little Help needed climbing 3-5 steps with a railing? : A Little 6 Click Score: 18    End of Session Equipment Utilized During Treatment: Gait belt;Oxygen Activity Tolerance: Treatment limited secondary to medical complications (Comment)(drop in Sp02) Patient left: in  chair;with call bell/phone within reach(chair alarm pad under pt but no box in room) Nurse Communication: Mobility status;Other (comment)(02 sats with mobility) PT Visit Diagnosis: Difficulty in walking, not elsewhere classified (R26.2)(dyspnea on exertion)    Time: 8768-1157 PT Time Calculation (min) (ACUTE ONLY): 32 min   Charges:   PT Evaluation $PT Eval Moderate Complexity: 1 Mod PT Treatments $Therapeutic Exercise: 8-22 mins        Marisa Severin, PT, DPT Acute Rehabilitation Services Pager [phone] Office [phone]      Marguarite Arbour A Sabra Heck 07/26/2019, 3:20 PM

## 2019-07-26 NOTE — Progress Notes (Signed)
Hypoglycemic Event  CBG: 58  Treatment: 8oz juice administered  Symptoms: NA  Follow-up CBG: [id number] CBG Result:151  Possible Reasons for Event: Pt reports not eating as much as he normally does.  Comments/MD notified:MD notified during rounds this AM    Glenford Bayley

## 2019-07-26 NOTE — Progress Notes (Signed)
Inpatient Diabetes Program Recommendations  AACE/ADA: New Consensus Statement on Inpatient Glycemic Control   Target Ranges:  Prepandial:   less than 140 mg/dL      Peak postprandial:   less than 180 mg/dL (1-2 hours)      Critically ill patients:  140 - 180 mg/dL   Results for Erik Logan, Erik Logan (MRN [id number]) as of 07/26/2019 09:26  Ref. Range 07/25/2019 08:08 07/25/2019 11:36 07/25/2019 15:46 07/25/2019 18:27 07/25/2019 20:46 07/26/2019 08:18  Glucose-Capillary Latest Ref Range: 70 - 99 mg/dL 247 (H)  Novolog 3 units  Levemir 8 units 200 (H)  Novolog 2 units 86 82 142 (H)     Levemir 8 units 58 (L)   Review of Glycemic Control Diabetes history: DM2 Outpatient Diabetes medications: Toujeo 20 units BID, Humalog 5 units with meals plus sliding scale for hyperglycemia Current orders for Inpatient glycemic control: Levemir 8 units BID, Novolog 0-9 units TID with meals, Novolog 0-5 units QHS  Inpatient Diabetes Program Recommendations:   Insulin-Basal: Please consider decreasing Levemir to 6 units BID.  HbgA1C: A1C 10.7% on 07/25/19 indicating an average glucose of 260 mg/dl over the past 2-3 months. Patient noted he was concerned about hypoglycemia at home since he has lost weight. Also based on glucose trends while inpatient, may need to decrease outpatient insulin regimen.  Thanks, Barnie Alderman, RN, MSN, CDE Diabetes Coordinator Inpatient Diabetes Program [phone] (Team Pager from 8am to 5pm)

## 2019-07-27 LAB — BASIC METABOLIC PANEL
Anion gap: 10 (ref 5–15)
BUN: 53 mg/dL — ABNORMAL HIGH (ref 8–23)
CO2: 20 mmol/L — ABNORMAL LOW (ref 22–32)
Calcium: 8 mg/dL — ABNORMAL LOW (ref 8.9–10.3)
Chloride: 105 mmol/L (ref 98–111)
Creatinine, Ser: 2.23 mg/dL — ABNORMAL HIGH (ref 0.61–1.24)
GFR calc Af Amer: 29 mL/min — ABNORMAL LOW (ref >60)
GFR calc non Af Amer: 25 mL/min — ABNORMAL LOW (ref >60)
Glucose, Bld: 255 mg/dL — ABNORMAL HIGH (ref 70–99)
Potassium: 4.6 mmol/L (ref 3.5–5.1)
Sodium: 135 mmol/L (ref 135–145)

## 2019-07-27 LAB — CBC WITH DIFFERENTIAL/PLATELET
Abs Immature Granulocytes: 0.19 10*3/uL — ABNORMAL HIGH (ref 0.00–0.07)
Basophils Absolute: 0.1 10*3/uL (ref 0.0–0.1)
Basophils Relative: 0 %
Eosinophils Absolute: 0.1 10*3/uL (ref 0.0–0.5)
Eosinophils Relative: 1 %
HCT: 37.8 % — ABNORMAL LOW (ref 39.0–52.0)
Hemoglobin: 12.7 g/dL — ABNORMAL LOW (ref 13.0–17.0)
Immature Granulocytes: 1 %
Lymphocytes Relative: 5 %
Lymphs Abs: 1.2 10*3/uL (ref 0.7–4.0)
MCH: 29.8 pg (ref 26.0–34.0)
MCHC: 33.6 g/dL (ref 30.0–36.0)
MCV: 88.7 fL (ref 80.0–100.0)
Monocytes Absolute: 1.1 10*3/uL — ABNORMAL HIGH (ref 0.1–1.0)
Monocytes Relative: 5 %
Neutro Abs: 19.7 10*3/uL — ABNORMAL HIGH (ref 1.7–7.7)
Neutrophils Relative %: 88 %
Platelets: 180 10*3/uL (ref 150–400)
RBC: 4.26 MIL/uL (ref 4.22–5.81)
RDW: 13.9 % (ref 11.5–15.5)
WBC: 22.3 10*3/uL — ABNORMAL HIGH (ref 4.0–10.5)
nRBC: 0 % (ref 0.0–0.2)

## 2019-07-27 LAB — GLUCOSE, CAPILLARY
Glucose-Capillary: 205 mg/dL — ABNORMAL HIGH (ref 70–99)
Glucose-Capillary: 247 mg/dL — ABNORMAL HIGH (ref 70–99)
Glucose-Capillary: 319 mg/dL — ABNORMAL HIGH (ref 70–99)
Glucose-Capillary: 297 mg/dL — ABNORMAL HIGH (ref 70–99)

## 2019-07-27 MED ORDER — INSULIN DETEMIR 100 UNIT/ML ~~LOC~~ SOLN
5.0000 [IU] | Freq: Every day | SUBCUTANEOUS | Status: DC
  Administered 2019-07-27 – 2019-07-28 (×2): 5 [IU] via SUBCUTANEOUS
  Filled 2019-07-27 (×3): qty 0.05

## 2019-07-27 NOTE — Progress Notes (Signed)
Physical Therapy Treatment Patient Details Name: Erik Logan MRN: [id number] DOB: [birth year] Today's Date: 07/27/2019    History of Present Illness Patient is a 86 y/o male who presents with worsening SOB and chest pain. EKG- sinus rhythm with 1st degree AV node block. CXR 5/9- atelectasis right lung base. CXR- 5/10-bilteral infiltrates. Admitted with acute hypoxic respiratory failure secondry to PNA. On 5/10, pt requiring BiPAP due to CP and respiratory distress. PMH includes DM, HTN, NSTEMI, CAD, HLD, SCC.    PT Comments    Pt eager to get up into the halls.  Sats on 8L HFNC did not allow SpO2 to stay up to adequate level and was 83%, on 10 L sats stayed at 87%.  Corresponding HR up to 118 bpm levels.   Follow Up Recommendations  Supervision/Assistance - 24 hour;Other (comment);Home health PT(pulmonary rehab once improved)     Equipment Recommendations  None recommended by PT    Recommendations for Other Services       Precautions / Restrictions Precautions Precautions: Fall Precaution Comments: watch 02, HFNC    Mobility  Bed Mobility               General bed mobility comments: Pt seated in bedside chair upon arrival  Transfers Overall transfer level: Needs assistance Equipment used: Rolling walker (2 wheeled) Transfers: Sit to/from Stand Sit to Stand: Min guard         General transfer comment: Cues for hand placement  Ambulation/Gait Ambulation/Gait assistance: Min guard Gait Distance (Feet): 170 Feet(x2) Assistive device: Rolling walker (2 wheeled) Gait Pattern/deviations: Step-through pattern Gait velocity: slower Gait velocity interpretation: <1.8 ft/sec, indicate of risk for recurrent falls General Gait Details: slower with ability to increase speed notably,  Shorter step length, no balance deviation.  SpO2 dropped to 83% on 8L HFNC at HR increase to upper 110's.   At 10 L, sats dropped to 87% with corresponding HR up to about 118 bpm.  pt dyspneic  overall at 2/4.   Stairs             Wheelchair Mobility    Modified Rankin (Stroke Patients Only)       Balance     Sitting balance-Leahy Scale: Good       Standing balance-Leahy Scale: Fair                              Cognition Arousal/Alertness: Awake/alert Behavior During Therapy: WFL for tasks assessed/performed Overall Cognitive Status: Within Functional Limits for tasks assessed                                        Exercises      General Comments General comments (skin integrity, edema, etc.): Coaching for pursed lip breathing      Pertinent Vitals/Pain Pain Assessment: Faces Faces Pain Scale: No hurt Pain Intervention(s): Monitored during session    Home Living                      Prior Function            PT Goals (current goals can now be found in the care plan section) Acute Rehab PT Goals Patient Stated Goal: to get off the oxygen PT Goal Formulation: With patient Time For Goal Achievement: 08/09/19 Potential to Achieve Goals: Good Progress towards PT  goals: Progressing toward goals    Frequency    Min 3X/week      PT Plan Current plan remains appropriate    Co-evaluation              AM-PAC PT "6 Clicks" Mobility   Outcome Measure  Help needed turning from your back to your side while in a flat bed without using bedrails?: A Little Help needed moving from lying on your back to sitting on the side of a flat bed without using bedrails?: A Little Help needed moving to and from a bed to a chair (including a wheelchair)?: A Little Help needed standing up from a chair using your arms (e.g., wheelchair or bedside chair)?: A Little Help needed to walk in hospital room?: A Little Help needed climbing 3-5 steps with a railing? : A Little 6 Click Score: 18    End of Session   Activity Tolerance: Patient tolerated treatment well;Other (comment)(noted some chest tightness, but not  under duress) Patient left: in chair;with call bell/phone within reach;with family/visitor present Nurse Communication: Mobility status PT Visit Diagnosis: Difficulty in walking, not elsewhere classified (R26.2)     Time: 6222-9798 PT Time Calculation (min) (ACUTE ONLY): 38 min  Charges:  $Gait Training: 8-22 mins $Therapeutic Activity: 23-37 mins                     07/27/2019  Erik Logan., PT Acute Rehabilitation Services [phone]  (pager) [phone]  (office)   Erik Logan 07/27/2019, 5:03 PM

## 2019-07-27 NOTE — Progress Notes (Signed)
Inpatient Diabetes Program Recommendations  AACE/ADA: New Consensus Statement on Inpatient Glycemic Control   Target Ranges:  Prepandial:   less than 140 mg/dL      Peak postprandial:   less than 180 mg/dL (1-2 hours)      Critically ill patients:  140 - 180 mg/dL   Results for FOCH, ROSENWALD (MRN [id number]) as of 07/27/2019 10:53  Ref. Range 07/26/2019 08:18 07/26/2019 09:39 07/26/2019 11:17 07/26/2019 15:43 07/26/2019 21:07 07/27/2019 08:05  Glucose-Capillary Latest Ref Range: 70 - 99 mg/dL 58 (L) 151 (H) 116 (H) 198 (H) 272 (H) 205 (H)   Review of Glycemic Control  Diabetes history: DM2 Outpatient Diabetes medications: Toujeo 20 units BID, Humalog5 units with meals plussliding scalefor hyperglycemia Current orders for Inpatient glycemic control: Novolog 0-9 units TID with meals, Novolog 0-5 units QHS  Inpatient Diabetes Program Recommendations:   Insulin-Basal: Patient received Levemir 8 units BID on 07/25/19 and fasting glucose 58 mg/dl on 07/26/19. Levemir was discontinued. Fasting glucose 205 mg/dl today. Please consider ordering Levemir 5 units Q24H.  HbgA1C:A1C 10.7% on 5/10/21indicating an average glucose of 260 mg/dl over the past 2-3 months. Patient noted he was concerned about hypoglycemia at home since he has lost weight. Also based on glucose trends while inpatient, may need to decrease outpatient insulin regimen at time of discharge.  Thanks, Barnie Alderman, RN, MSN, CDE Diabetes Coordinator Inpatient Diabetes Program [phone] (Team Pager from 8am to 5pm)

## 2019-07-27 NOTE — Progress Notes (Signed)
PROGRESS NOTE    Erik Logan  YPP:[id number] DOB: 08/14/1928 DOA: 07/28/2019 PCP: Neale Burly, MD    Brief Narrative:  Patient was admitted to the hospital with a working diagnosis of acute hypoxic respiratory failure due to multifocal community-acquired pneumonia (present on admission).  86 year old male with significant past medical history for hypertension, type 2 diabetes mellitus, coronary artery disease, chronic diastolic heart failure, and chronic kidney disease stage IV.  Patient reported worsening dyspnea for several weeks, at one point it was associated with lower extremity edema that transiently improved with increased dose of furosemide.  Due to persistent cough he received a short course of prednisone, with no significant improvement of his symptoms, he developed pleuritic chest pain mainly on the right side.  On his initial physical examination his oximetry was 80% on room air, blood pressure was 130/66, heart rate 75 and respiratory 23, his lungs had no significant wheezing or rales, heart S1-S2 present and rhythmic, tachycardic, abdomen soft, no lower extremity edema. Sodium 134, potassium 4.9, chloride 1 1, bicarb 21, glucose 417, BUN 34, creatinine 1.5.  Troponin I 105.  4.3, hemoglobin 15.4, hematocrit 46.5, platelets 146.  SARS Covid 19 negative.  Chest radiograph with multifocal interstitial infiltrates, all 4 quadrants.  EKG 81 bpm, left axis deviation, left anterior fascicular block, first degree AV block, sinus rhythm, no ST segment or T wave changes.   Patient developed respiratory distress and required non invasive mechanical ventilation and furosemide with improvement in his symptoms. Continue with broad spectrum antibiotic therapy.   Assessment & Plan:   Principal Problem:   Pneumonia Active Problems:   Essential (primary) hypertension   Type 2 diabetes mellitus without complication, with long-term current use of insulin (HCC)   CKD (chronic kidney disease)  stage 4, GFR 15-29 ml/min (HCC)   Chronic diastolic CHF (congestive heart failure) (HCC)   Acute respiratory failure with hypoxia (HCC)  Acute hypoxic respiratory failure due to multifocal community acquired pneumonia.  Patient's dyspnea has improved, now off non invasive mechanical ventilation, on 8 L/min of HFNC with oxygenation of 92%.  - Continue monitoring WBC which is grossly elevated but improved. Check PCT. - Continue supplemental oxygen as needed to maintain SpO2 >90%, normal respiratory effort.  - Continue ceftriaxone, azithromycin.  - Cultures NGTD x3 days; sputum culture not resulted.  COPD: Diagnosed at previous hospitalization, has extensive tobacco use history. No current wheezing, so doubt steroids would be helpful.  - Continue bronchodilators.   Acute on chronic diastolic heart failure. Echocardiogram from 2019 with preserved LV systolic function 55 to 45%. Updated echocardiogram during this admission similar to prior w/G1DD, no new WMA's noted. IVC suggestive of normal RA pressures.  - Not currently peripherally overloaded. With AKI, will continue to hold diuretic.  - Continue metoprolol, isosorbide as below.  AKI on CKD stage IV with non gap metabolic acidosis: - Monitor off diuretic. Consider PVR/renal U/S if worsening despite holding diuretic. - Avoid hypotension and nephrotoxic medications.   T2DM with hypoglycemia:  - Actually hyperglycemic fasting this AM. Will restart basal insulin at lower dose this evening.  - Continue SSI.  Dyslipidemia:  - Continue statin  CAD. No clinical signs of acute coronary syndrome, chest pain seems to be more pleuritic. Troponin I elevation in the setting of decompensated diastolic heart failure is due to demand ischemia. No ischemic changes on ECG. - No inpatient work up planned.  - Continue home medications inclusive of isosorbide, beta blocker, statin.   GERD:  -  Continue PPI  Status is: Inpatient  Remains inpatient  appropriate because:IV treatments appropriate due to intensity of illness or inability to take PO Remains with new oxygen requirement, not at respiratory baseline.   Dispo: The patient is from: Home              Anticipated d/c is to: [facility] home health              Anticipated d/c date is: 1-2 days              Patient currently is not medically stable to d/c.     DVT prophylaxis: Enoxaparin   Code Status:   DNR  Family Communication: None at bedside  Antimicrobials:   Ceftriaxone and azithromycin     Subjective: Feels day-by-day improvement. Breathing improved enough to get to the bathroom, albeit with severe, constant dyspnea requiring supplemental oxygen and assistance. No chest pain currently. No swelling.   Objective: Vitals:   07/27/19 0749 07/27/19 0802 07/27/19 1352 07/27/19 1608  BP:  130/65  (!) 149/60  Pulse:  78  88  Resp:      Temp:  97.7 F (36.5 C)  98.1 F (36.7 C)  TempSrc:  Oral  Oral  SpO2: 93% 94% 94% 94%  Weight:      Height:        Intake/Output Summary (Last 24 hours) at 07/27/2019 1953 Last data filed at 07/27/2019 1851 Gross per 24 hour  Intake 320 ml  Output 750 ml  Net -430 ml   Filed Weights   07/22/2019 1930 07/25/19 0430  Weight: 87.2 kg 84.6 kg    Examination:  Gen: Pleasant, elderly male in no distress Pulm: Nonlabored breathing but tachypneic at rest, supplemental oxygen on. Minimal crackles diffusely. CV: Regular rate and rhythm. No murmur, rub, or gallop. No JVD, no dependent edema. GI: Abdomen soft, non-tender, non-distended, with normoactive bowel sounds.  Ext: Warm, no deformities Skin: No rashes, lesions or ulcers on visualized skin. Neuro: Alert and oriented. No focal neurological deficits. Psych: Judgement and insight appear fair. Mood euthymic & affect congruent. Behavior is appropriate.    Data Reviewed: I have personally reviewed following labs and imaging studies  CBC: Recent Labs  Lab 07/19/2019 1952 07/25/19  0440 07/26/19 0236 07/27/19 0339  WBC 24.3* 24.9* 27.8* 22.3*  NEUTROABS  --  21.4* 24.2* 19.7*  HGB 15.4 14.8 13.9 12.7*  HCT 46.5 44.9 42.1 37.8*  MCV 89.3 89.4 88.4 88.7  PLT 146* 143* 141* 938   Basic Metabolic Panel: Recent Labs  Lab 07/19/2019 1952 07/25/19 0440 07/26/19 0236 07/27/19 0339  NA 134* 136 141 135  K 4.9 3.7 3.7 4.6  CL 101 106 108 105  CO2 21* 19* 19* 20*  GLUCOSE 417* 350* 82 255*  BUN 34* 37* 39* 53*  CREATININE 1.85* 1.88* 1.99* 2.23*  CALCIUM 8.7* 8.3* 8.3* 8.0*   GFR: Estimated Creatinine Clearance: 24.3 mL/min (A) (by C-G formula based on SCr of 2.23 mg/dL (H)). Liver Function Tests: No results for input(s): AST, ALT, ALKPHOS, BILITOT, PROT, ALBUMIN in the last 168 hours. No results for input(s): LIPASE, AMYLASE in the last 168 hours. No results for input(s): AMMONIA in the last 168 hours. Coagulation Profile: Recent Labs  Lab 08/12/2019 2241  INR 1.3*   Cardiac Enzymes: No results for input(s): CKTOTAL, CKMB, CKMBINDEX, TROPONINI in the last 168 hours. BNP (last 3 results) No results for input(s): PROBNP in the last 8760 hours. HbA1C: Recent Labs  07/25/19 0440  HGBA1C 10.7*   CBG: Recent Labs  Lab 07/26/19 1543 07/26/19 2107 07/27/19 0805 07/27/19 1141 07/27/19 1605  GLUCAP 198* 272* 205* 247* 319*   Lipid Profile: No results for input(s): CHOL, HDL, LDLCALC, TRIG, CHOLHDL, LDLDIRECT in the last 72 hours. Thyroid Function Tests: No results for input(s): TSH, T4TOTAL, FREET4, T3FREE, THYROIDAB in the last 72 hours. Anemia Panel: No results for input(s): VITAMINB12, FOLATE, FERRITIN, TIBC, IRON, RETICCTPCT in the last 72 hours.    Radiology Studies: I have reviewed all of the imaging during this hospital visit personally     Scheduled Meds: . acetaminophen  650 mg Oral Q6H  . amLODipine  5 mg Oral Daily  . aspirin EC  81 mg Oral Daily  . atorvastatin  40 mg Oral Daily  . azithromycin  500 mg Oral Daily  . heparin   5,000 Units Subcutaneous Q8H  . insulin aspart  0-5 Units Subcutaneous QHS  . insulin aspart  0-9 Units Subcutaneous TID WC  . isosorbide mononitrate  30 mg Oral Daily  . living well with diabetes book   Does not apply Once  . mouth rinse  15 mL Mouth Rinse BID  . metoprolol tartrate  12.5 mg Oral Daily  . mometasone-formoterol  2 puff Inhalation BID  .  morphine injection  1 mg Intravenous Once  . pantoprazole  40 mg Oral Daily  . sodium chloride flush  3 mL Intravenous Once  . sodium chloride flush  3 mL Intravenous Q12H  . sodium chloride flush  3 mL Intravenous Q12H   Continuous Infusions: . sodium chloride    . cefTRIAXone (ROCEPHIN)  IV Stopped (07/27/19 0700)     LOS: 2 days    Time spent: 25 minutes  Patrecia Pour, MD

## 2019-07-27 NOTE — Progress Notes (Signed)
Occupational Therapy Evaluation Patient Details Name: Erik Logan MRN: [id number] DOB: [birth year] Today's Date: 07/27/2019    History of Present Illness Patient is a 86 y/o male who presents with worsening SOB and chest pain. EKG- sinus rhythm with 1st degree AV node block. CXR 5/9- atelectasis right lung base. CXR- 5/10-bilteral infiltrates. Admitted with acute hypoxic respiratory failure secondry to PNA. On 5/10, pt requiring BiPAP due to CP and respiratory distress. PMH includes DM, HTN, NSTEMI, CAD, HLD, SCC.   Clinical Impression   PTA pt lived with his wife, independent in all ADL, IADL, and mobility tasks. Pt has started ambulating with a 4WW over the last couple months and reports 0 falls in the last 6 months. Pt does not use oxygen at home and is currently on 8L HFNC. Pt currently independent to min guard for self-care and mobility tasks. Pt able to ambulate to/from bathroom with RW and min guard, noting 0 instances of LOB. Pt completed toileting task and tolerated standing 1 x 9 min at the sink to complete grooming/hygiene tasks. SpO2 dropped to 87% on 8L HFNC with pt requiring ~1 min seated rest break to return to 90s. Educated/instructed pt on pursed lip breathing strategies with handout provided. Educated and provided pt with additional handout on energy conservation techniques. Pt demonstrates decreased endurance, balance, standing tolerance, and activity tolerance impacting ability to complete self-care and functional transfer tasks. Recommend skilled OT services to address above deficits in order to promote function and prevent further decline.     Follow Up Recommendations  No OT follow up;Supervision - Intermittent    Equipment Recommendations  None recommended by OT    Recommendations for Other Services       Precautions / Restrictions Precautions Precautions: Fall;Other (comment) Precaution Comments: watch 02, HFNC Restrictions Weight Bearing Restrictions: No       Mobility Bed Mobility               General bed mobility comments: Pt seated in bedside chair upon OT arrival  Transfers Overall transfer level: Needs assistance Equipment used: Rolling walker (2 wheeled) Transfers: Sit to/from Stand Sit to Stand: Min guard         General transfer comment: Cues for hand placement    Balance Overall balance assessment: Needs assistance Sitting-balance support: Feet supported;No upper extremity supported Sitting balance-Leahy Scale: Good     Standing balance support: During functional activity Standing balance-Leahy Scale: Fair                             ADL either performed or assessed with clinical judgement   ADL Overall ADL's : Needs assistance/impaired Eating/Feeding: Independent;Sitting   Grooming: Wash/dry hands;Wash/dry face;Oral care;Brushing hair;Supervision/safety;Standing(shaving) Grooming Details (indicate cue type and reason): While standing at the sink Upper Body Bathing: Set up;Supervision/ safety;Sitting   Lower Body Bathing: Supervison/ safety;Set up;Sit to/from stand   Upper Body Dressing : Set up;Supervision/safety;Sitting   Lower Body Dressing: Set up;Supervision/safety;Sit to/from stand   Toilet Transfer: Min guard;Ambulation;Regular Toilet;Grab bars Toilet Transfer Details (indicate cue type and reason): Cues for hand placement and body positioning Toileting- Clothing Manipulation and Hygiene: Set up;Supervision/safety;Sit to/from stand       Functional mobility during ADLs: Min guard;Rolling walker General ADL Comments: Pt able to ambulate to/from bathroom with RW and min guard. 0 instances of LOB. Pt tolerated standing 1 x 9 min at the sink to complete grooming/hygiene tasks.      Vision  Baseline Vision/History: Wears glasses Wears Glasses: Reading only       Perception     Praxis      Pertinent Vitals/Pain Pain Assessment: No/denies pain     Hand Dominance Right    Extremity/Trunk Assessment Upper Extremity Assessment Upper Extremity Assessment: Overall WFL for tasks assessed   Lower Extremity Assessment Lower Extremity Assessment: Defer to PT evaluation       Communication Communication Communication: HOH   Cognition Arousal/Alertness: Awake/alert Behavior During Therapy: WFL for tasks assessed/performed Overall Cognitive Status: Within Functional Limits for tasks assessed                                 General Comments: Pt pleasant and willing to participate in therapy tasks.    General Comments  Educated and provided pt with handouts on energy conservation and pursed lip breathing. SpO2 dropped to 87% on 8L HFNC during activity with pt requiring ~1 min seated rest break to return to 90s.     Exercises Exercises: Other exercises Other Exercises Other Exercises: Instructed pt on pursed lip breathing exercises.    Shoulder Instructions      Home Living Family/patient expects to be discharged to:: Private residence Living Arrangements: Spouse/significant other Available Help at Discharge: Family;Available 24 hours/day Type of Home: House Home Access: Level entry     Home Layout: Laundry or work area in Armed forces training and education officer (freezer down there))     Bathroom Shower/Tub: Teacher, early years/pre: Standard     Home Equipment: Environmental consultant - 2 wheels;Tub bench;Hand held shower head   Additional Comments: Pt reports that he does not use the tub bench. Wife ambulates with a cane.       Prior Functioning/Environment Level of Independence: Independent        Comments: Pt independent in all ADL, IADL, and mobility tasks. Pt reports using a 4WW the last couple months. 0 falls in the last 6 months. Pt still drives.        OT Problem List: Decreased activity tolerance;Impaired balance (sitting and/or standing);Cardiopulmonary status limiting activity      OT Treatment/Interventions: Self-care/ADL  training;Therapeutic exercise;Neuromuscular education;Energy conservation;DME and/or AE instruction;Therapeutic activities;Patient/family education;Balance training    OT Goals(Current goals can be found in the care plan section) Acute Rehab OT Goals Patient Stated Goal: to get off the oxygen Time For Goal Achievement: 08/10/19 Potential to Achieve Goals: Good ADL Goals Pt Will Perform Tub/Shower Transfer: Tub transfer;with supervision;ambulating Additional ADL Goal #1: Pt to complete all ADLs with modified independence with SpO2 maintaining in 90s. Additional ADL Goal #2: Pt to recall and verbalize 3 energy conservation strategies with 0 verbal cues. Additional ADL Goal #3: Pt to tolerate standing up to 10 min with modified independence, in preparation for ADLs.  OT Frequency: Min 2X/week   Barriers to D/C:            Co-evaluation              AM-PAC OT "6 Clicks" Daily Activity     Outcome Measure Help from another person eating meals?: None Help from another person taking care of personal grooming?: A Little Help from another person toileting, which includes using toliet, bedpan, or urinal?: A Little Help from another person bathing (including washing, rinsing, drying)?: A Little Help from another person to put on and taking off regular upper body clothing?: A Little Help from another person to put on and  taking off regular lower body clothing?: A Little 6 Click Score: 19   End of Session Equipment Utilized During Treatment: Gait belt;Rolling walker;Oxygen Nurse Communication: Mobility status  Activity Tolerance: Patient tolerated treatment well Patient left: in chair;with call bell/phone within reach  OT Visit Diagnosis: Unsteadiness on feet (R26.81);Other (comment)(Decreased activity tolerance)                Time: 9767-3419 OT Time Calculation (min): 42 min Charges:  OT General Charges $OT Visit: 1 Visit OT Evaluation $OT Eval Moderate Complexity: 1 Mod OT  Treatments $Self Care/Home Management : 8-22 mins $Therapeutic Activity: 8-22 mins  Mauri Brooklyn OTR/L [phone]  Mauri Brooklyn 07/27/2019, 11:21 AM

## 2019-07-28 ENCOUNTER — Inpatient Hospital Stay (HOSPITAL_COMMUNITY): Payer: Medicare Other

## 2019-07-28 DIAGNOSIS — J9601 Acute respiratory failure with hypoxia: Secondary | ICD-10-CM

## 2019-07-28 DIAGNOSIS — J849 Interstitial pulmonary disease, unspecified: Secondary | ICD-10-CM

## 2019-07-28 LAB — BASIC METABOLIC PANEL
Anion gap: 11 (ref 5–15)
BUN: 61 mg/dL — ABNORMAL HIGH (ref 8–23)
CO2: 18 mmol/L — ABNORMAL LOW (ref 22–32)
Calcium: 8.1 mg/dL — ABNORMAL LOW (ref 8.9–10.3)
Chloride: 104 mmol/L (ref 98–111)
Creatinine, Ser: 2.34 mg/dL — ABNORMAL HIGH (ref 0.61–1.24)
GFR calc Af Amer: 28 mL/min — ABNORMAL LOW (ref >60)
GFR calc non Af Amer: 24 mL/min — ABNORMAL LOW (ref >60)
Glucose, Bld: 271 mg/dL — ABNORMAL HIGH (ref 70–99)
Potassium: 3.5 mmol/L (ref 3.5–5.1)
Sodium: 133 mmol/L — ABNORMAL LOW (ref 135–145)

## 2019-07-28 LAB — GLUCOSE, CAPILLARY
Glucose-Capillary: 199 mg/dL — ABNORMAL HIGH (ref 70–99)
Glucose-Capillary: 221 mg/dL — ABNORMAL HIGH (ref 70–99)
Glucose-Capillary: 200 mg/dL — ABNORMAL HIGH (ref 70–99)
Glucose-Capillary: 234 mg/dL — ABNORMAL HIGH (ref 70–99)

## 2019-07-28 LAB — CBC
HCT: 37.4 % — ABNORMAL LOW (ref 39.0–52.0)
Hemoglobin: 12.3 g/dL — ABNORMAL LOW (ref 13.0–17.0)
MCH: 28.8 pg (ref 26.0–34.0)
MCHC: 32.9 g/dL (ref 30.0–36.0)
MCV: 87.6 fL (ref 80.0–100.0)
Platelets: 161 10*3/uL (ref 150–400)
RBC: 4.27 MIL/uL (ref 4.22–5.81)
RDW: 13.9 % (ref 11.5–15.5)
WBC: 18.1 10*3/uL — ABNORMAL HIGH (ref 4.0–10.5)
nRBC: 0 % (ref 0.0–0.2)

## 2019-07-28 LAB — PROCALCITONIN: Procalcitonin: 1.5 ng/mL

## 2019-07-28 LAB — STREP PNEUMONIAE URINARY ANTIGEN: Strep Pneumo Urinary Antigen: NEGATIVE

## 2019-07-28 MED ORDER — INSULIN ASPART 100 UNIT/ML ~~LOC~~ SOLN
2.0000 [IU] | Freq: Three times a day (TID) | SUBCUTANEOUS | Status: DC
  Administered 2019-07-28 – 2019-07-29 (×3): 2 [IU] via SUBCUTANEOUS

## 2019-07-28 NOTE — Progress Notes (Addendum)
Pt had episode where he began coughing and desated to low 80s on 8L. I continued to bump O2 up, but O2 didn't come up to 90% until on 15L. Pt complaining of SOB. Pt lips and fingers had bluish tinge. RT was called, and she came and assessed and listened to pt. She put patient on bipap, O2 came up to 97%. Dr.Opyd paged. X-ray was done on patient. Will continue to monitor.

## 2019-07-28 NOTE — Progress Notes (Signed)
Physical Therapy Treatment Patient Details Name: Erik Logan MRN: [id number] DOB: 06-15-1928 Today's Date: 07/28/2019    History of Present Illness Patient is a 86 y/o male who presents with worsening SOB and chest pain. EKG- sinus rhythm with 1st degree AV node block. CXR 5/9- atelectasis right lung base. CXR- 5/10-bilteral infiltrates. Admitted with acute hypoxic respiratory failure secondry to PNA. On 5/10, pt requiring BiPAP due to CP and respiratory distress. On 5/13 pt with pulmonary consult and to have chest CT to evaluate for IPF.  PMH includes DM, HTN, NSTEMI, CAD, HLD, SCC.    PT Comments    Spoke with RN and pt had episode earlier this morning requiring BiPAP again.  Pt was able to participate with PT with O2 sats closely monitored and O2 increased for activity (10 L HFNC rest and 15 L HFNC for walking).  Pt required rest breaks, close monitoring of vitals, and cues for transfer and breathing techniques.  Pt did have 1 LOB requiring mod A to recover.       Follow Up Recommendations  Supervision/Assistance - 24 hour;Other (comment);Home health PT(pulmonary rehab as able)     Equipment Recommendations  None recommended by PT    Recommendations for Other Services       Precautions / Restrictions Precautions Precautions: Fall Precaution Comments: watch 02, HFNC    Mobility  Bed Mobility               General bed mobility comments: Pt seated in bedside chair upon arrival  Transfers Overall transfer level: Needs assistance Equipment used: 1 person hand held assist Transfers: Sit to/from Stand Sit to Stand: Min guard         General transfer comment: Cues for hand placement; sit to stand x 3  Ambulation/Gait Ambulation/Gait assistance: Min guard;Mod assist Gait Distance (Feet): 35 Feet(12' then 35') Assistive device: 1 person hand held assist Gait Pattern/deviations: Step-through pattern Gait velocity: slower   General Gait Details: Pt had 1 LOB  requiring mod A to recover - otherwise min guard for safety. Cued for rest breaks and breathing technique.  DOE of 3/4.  See below for O2 sats.   Stairs             Wheelchair Mobility    Modified Rankin (Stroke Patients Only)       Balance Overall balance assessment: Needs assistance Sitting-balance support: Feet supported;No upper extremity supported Sitting balance-Leahy Scale: Good     Standing balance support: During functional activity Standing balance-Leahy Scale: Fair Standing balance comment: Requires UE support in standing.                            Cognition Arousal/Alertness: Awake/alert Behavior During Therapy: WFL for tasks assessed/performed Overall Cognitive Status: Within Functional Limits for tasks assessed                                 General Comments: Pt pleasant and willing to participate in therapy tasks.       Exercises      General Comments General comments (skin integrity, edema, etc.): Pt on 10 L HFNC at arrival with sats 91%.  Initially ambulated 12' with 10 L HFNC and sats down to 87% requiring 2 mins at 11 L to recover.  Then ambulated 35' on 15 L HFNC (O2 tank only had option of 10 or 15).  Sats  initially 87% but dropped to 85% during recovery. Returned to 90% on 15 L HFNC in 30 sec, but took 5 mins to gradually decrease back to 10 L HFNC.      Pertinent Vitals/Pain Pain Assessment: No/denies pain    Home Living                      Prior Function            PT Goals (current goals can now be found in the care plan section) Progress towards PT goals: Progressing toward goals    Frequency    Min 3X/week      PT Plan Current plan remains appropriate    Co-evaluation              AM-PAC PT "6 Clicks" Mobility   Outcome Measure  Help needed turning from your back to your side while in a flat bed without using bedrails?: A Little Help needed moving from lying on your back to  sitting on the side of a flat bed without using bedrails?: A Little Help needed moving to and from a bed to a chair (including a wheelchair)?: A Little Help needed standing up from a chair using your arms (e.g., wheelchair or bedside chair)?: A Little Help needed to walk in hospital room?: A Little Help needed climbing 3-5 steps with a railing? : A Little 6 Click Score: 18    End of Session Equipment Utilized During Treatment: Gait belt;Oxygen Activity Tolerance: Patient limited by fatigue Patient left: in chair;with call bell/phone within reach;with family/visitor present Nurse Communication: Mobility status PT Visit Diagnosis: Difficulty in walking, not elsewhere classified (R26.2)     Time: 2229-7989 PT Time Calculation (min) (ACUTE ONLY): 23 min  Charges:  $Gait Training: 8-22 mins $Therapeutic Activity: 8-22 mins                     Maggie Font, PT Acute Rehab Services Pager [phone] [facility] [phone] [facility] [place] 07/28/2019, 5:17 PM

## 2019-07-28 NOTE — Progress Notes (Signed)
Inpatient Diabetes Program Recommendations  AACE/ADA: New Consensus Statement on Inpatient Glycemic Control   Target Ranges:  Prepandial:   less than 140 mg/dL      Peak postprandial:   less than 180 mg/dL (1-2 hours)      Critically ill patients:  140 - 180 mg/dL   Results for MARSH, HECKLER (MRN [id number]) as of 07/28/2019 09:40  Ref. Range 07/27/2019 08:05 07/27/2019 11:41 07/27/2019 16:05 07/27/2019 21:02 07/28/2019 07:49  Glucose-Capillary Latest Ref Range: 70 - 99 mg/dL 205 (H) 247 (H) 319 (H) 297 (H) 199 (H)   Review of Glycemic Control  Diabetes history: DM2 Outpatient Diabetes medications: Toujeo 20 units BID, Humalog5 units with meals plussliding scalefor hyperglycemia Current orders for Inpatient glycemic control: Levemir 5 units QHS, Novolog 0-9 units TID with meals, Novolog 0-5 units QHS  Inpatient Diabetes Program Recommendations:   Insulin-Meal Coverage: Please consider ordering Novolog 2 units TID with meals for meal coverage if patient eats at least 50% of meals.  HbgA1C:A1C 10.7% on 5/10/21indicating an average glucose of 260 mg/dl over the past 2-3 months.Patient noted he was concerned about hypoglycemiaat home since he has lost weight. Also based on glucose trends while inpatient, may need to decrease outpatient insulin regimen at time of discharge.  Thanks, Barnie Alderman, RN, MSN, CDE Diabetes Coordinator Inpatient Diabetes Program [phone] (Team Pager from 8am to 5pm)

## 2019-07-28 NOTE — Consult Note (Signed)
Name: Erik Logan MRN: [id number] DOB: 1928/07/16    ADMISSION DATE:  07/18/2019 CONSULTATION DATE:  07/28/2019   REFERRING MD :  Bonner Puna, [facility]   CHIEF COMPLAINT: Respiratory distress with hypoxia  BRIEF PATIENT DESCRIPTION: 86 year old remote smoker with dyspnea for several months and acute hypoxic respiratory failure with concern for pulmonary fibrosis and pneumonia  SIGNIFICANT EVENTS  5/9 hospital admission  STUDIES:  HRCT 5/13   HISTORY OF PRESENT ILLNESS: 86 year old man admitted with a history of dyspnea worsening for several months.  He reports an occasional dry cough.  He was treated for lower extremity edema as an outpatient with Lasix which improved his edema but did not affect his shortness of breath.  He was admitted to [facility] in December and had a chest x-ray done and was told that he had "COPD".  this progressed to class IV symptoms, on admission oxygen saturation was 80% on room air.  Covid testing was negative both in December and on this admit. I have reviewed his records in care everywhere and I do not have any record of previous chest x-ray or other imaging Required 8 L nasal cannula in the last 24 hours has required up to 15 L and BiPAP overnight, hence PCCM consulted He is being treated for community-acquired pneumonia with ceftriaxone and azithromycin Afebrile last 3 days  He is a resident of [facility], worked in the school system for 20 years, lives in an older home built in the 60s, has carpets, has always had pets except for the last 5 years, no birds no other sick contacts, no mold exposure   PAST MEDICAL HISTORY :   has a past medical history of Chronic renal insufficiency, stage 2 (mild), Coronary artery disease, History of duodenal ulcer (1970s), Hyperlipidemia, Hypertension, NSTEMI (non-ST elevated myocardial infarction) ([place]) (06/01/2017), SCC (squamous cell carcinoma) Well Diff (10/10/2015), Squamous cell carcinoma in situ (SCCIS)  (10/10/2015), Squamous cell carcinoma in situ (SCCIS) x3 (08/25/2018), and Type II diabetes mellitus ([place]).  has a past surgical history that includes Tonsillectomy (1930s); Cataract extraction w/ intraocular lens implant (Left); Coronary angioplasty with stent (06/02/2017); Eye surgery (Right, 1950s); LEFT HEART CATH AND CORONARY ANGIOGRAPHY (N/A, 06/02/2017); CORONARY STENT INTERVENTION (N/A, 06/02/2017); and CORONARY BALLOON ANGIOPLASTY (N/A, 06/02/2017). Prior to Admission medications   Medication Sig Start Date End Date Taking? Authorizing Provider  amLODipine (NORVASC) 5 MG tablet Take 1 tablet by mouth once daily 06/06/19  Yes Branch, Alphonse Guild, MD  aspirin EC 81 MG tablet Take 1 tablet (81 mg total) by mouth daily. 06/03/17  Yes Bhagat, Bhavinkumar, PA  atorvastatin (LIPITOR) 40 MG tablet Take 1 tablet by mouth once daily 06/06/19  Yes Branch, Alphonse Guild, MD  furosemide (LASIX) 40 MG tablet Take 20-40 mg by mouth daily. Take 1/2 tablet daily, take an additional 1 tablet with noticeable swelling.   Yes [provider]  insulin lispro (HUMALOG) 100 UNIT/ML injection Inject 5 Units into the skin See admin instructions. Per Sliding Scale   Yes [provider]  isosorbide mononitrate (IMDUR) 30 MG 24 hr tablet Take 1 tablet by mouth once daily 12/24/18  Yes Branch, Alphonse Guild, MD  losartan (COZAAR) 25 MG tablet Take 1 tablet (25 mg total) by mouth daily. 07/05/19 10/03/19 Yes Verta Ellen., NP  metoprolol tartrate (LOPRESSOR) 25 MG tablet Take 0.5 tablets (12.5 mg total) by mouth daily. 06/03/17  Yes Bhagat, Bhavinkumar, PA  nitroGLYCERIN (NITROSTAT) 0.4 MG SL tablet DISSOLVE ONE TABLET UNDER THE  TONGUE EVERY 5 MINUTES AS NEEDED FOR CHEST PAIN.  DO NOT EXCEED A TOTAL OF 3 DOSES IN 15 MINUTES Patient taking differently: Place 0.4 mg under the tongue every 5 (five) minutes as needed for chest pain. Do not exceed 3 doses in 15 minutes. 07/09/18  Yes BranchAlphonse Guild, MD  omeprazole  (PRILOSEC) 20 MG capsule Take 20 mg by mouth daily as needed (Heartburn).    Yes [provider]  Polyethyl Glycol-Propyl Glycol (SYSTANE OP) Apply 2 drops to eye daily as needed (Dry eyes).   Yes [provider]  TOUJEO SOLOSTAR 300 UNIT/ML SOPN Inject 20 Units into the skin 2 (two) times daily.  03/25/18  Yes [provider]   Allergies  Allergen Reactions  . Beef-Derived Products Hives    Red meat  . Sulfa Antibiotics Swelling    FAMILY HISTORY:  family history includes Diabetes in his mother; Healthy in his sister; Hypertension in his mother; Pulmonary disease in his father and sister. SOCIAL HISTORY:  reports that he has quit smoking. His smoking use included cigarettes. He has a 32.00 pack-year smoking history. He has never used smokeless tobacco. He reports that he does not drink alcohol or use drugs.  REVIEW OF SYSTEMS:   No wheezing, cough with clear white sputum No edema, chest pain, palpitations, no orthopnea or proximal nocturnal dyspnea No anorexia, nausea vomiting  No joint swelling No skin rash  SUBJECTIVE:   VITAL SIGNS: Temp:  [97.9 F (36.6 C)-98.7 F (37.1 C)] 97.9 F (36.6 C) (05/13 0748) Pulse Rate:  [84-101] 101 (05/13 0748) Resp:  [18-19] 19 (05/13 0748) BP: (149-153)/(60-76) 150/76 (05/13 0748) SpO2:  [90 %-97 %] 90 % (05/13 0838) Weight:  [85.1 kg] 85.1 kg (05/13 0500)  PHYSICAL EXAMINATION: Gen. Pleasant, well-nourished, in no distress, normal affect ENT - no pallor,icterus, no post nasal drip Neck: No JVD, no thyromegaly, no carotid bruits Lungs: no use of accessory muscles, no dullness to percussion, right extensive crackles halfway up, left basal crackles, dry, no rhonchi  Cardiovascular: Rhythm regular, heart sounds  normal, no murmurs or gallops, no peripheral edema Abdomen: soft and non-tender, no hepatosplenomegaly, BS normal. Musculoskeletal: No deformities, no cyanosis or clubbing Neuro:  alert, non  focal   Chest x-ray 5/13 personally reviewed which shows bilateral right more than left prominent interstitial markings with superimposed consolidation in the right midlung  Recent Labs  Lab 07/26/19 0236 07/27/19 0339 07/28/19 0323  NA 141 135 133*  K 3.7 4.6 3.5  CL 108 105 104  CO2 19* 20* 18*  BUN 39* 53* 61*  CREATININE 1.99* 2.23* 2.34*  GLUCOSE 82 255* 271*   Recent Labs  Lab 07/26/19 0236 07/27/19 0339 07/28/19 0323  HGB 13.9 12.7* 12.3*  HCT 42.1 37.8* 37.4*  WBC 27.8* 22.3* 18.1*  PLT 141* 180 161   DG CHEST PORT 1 VIEW  Result Date: 07/28/2019 CLINICAL DATA:  Respiratory failure. EXAM: PORTABLE CHEST 1 VIEW COMPARISON:  Chest x-ray dated Jul 25, 2019. FINDINGS: Lordotic positioning. Stable cardiomediastinal silhouette. Normal pulmonary vascularity. Poor inspiration compared to the prior study. Peripheral and basilar predominant coarse interstitial markings again noted with improving superimposed consolidation in the right lung. No pleural effusion or pneumothorax. No acute osseous abnormality. IMPRESSION: 1. Improving right-sided pneumonia superimposed on chronic interstitial lung disease. Electronically Signed   By: Titus Dubin M.D.   On: 07/28/2019 08:43   ECHOCARDIOGRAM COMPLETE  Result Date: 07/26/2019    ECHOCARDIOGRAM REPORT   Patient Name:  [name] Date of Exam: 07/26/2019 Medical Rec #:  [id number]     Height:       68.0 in Accession #:    [id number]    Weight:       186.5 lb Date of Birth:  [birth year]    BSA:          1.984 m Patient Age:    40 years      BP:           134/60 mmHg Patient Gender: M             HR:           67 bpm. Exam Location:  Inpatient Procedure: 2D Echo, Color Doppler and Cardiac Doppler Indications:    R06.9 DOE  History:        Patient has prior history of Echocardiogram examinations, most                 recent 06/03/2017. CHF, NSTEMI; Risk Factors:Hypertension,                 Diabetes and Dyslipidemia.  Sonographer:    Raquel Sarna  Senior RDCS Referring Phys: [id number] Export  Sonographer Comments: Technically difficult due to poor echo windows and respiratory variation. No parasternal or suprasternal window. IMPRESSIONS  1. Left ventricular ejection fraction, by estimation, is 55 to 60%. The left ventricle has normal function. The left ventricle has no regional wall motion abnormalities. Left ventricular diastolic parameters are consistent with Grade I diastolic dysfunction (impaired relaxation). Elevated left atrial pressure.  2. Right ventricular systolic function is normal. The right ventricular size is normal. Tricuspid regurgitation signal is inadequate for assessing PA pressure.  3. The mitral valve is normal in structure. No evidence of mitral valve regurgitation. No evidence of mitral stenosis.  4. The aortic valve was not well visualized. Aortic valve regurgitation is not visualized. No aortic stenosis is present.  5. The inferior vena cava is normal in size with greater than 50% respiratory variability, suggesting right atrial pressure of 3 mmHg. Comparison(s): No significant change from prior study. FINDINGS  Left Ventricle: Left ventricular ejection fraction, by estimation, is 55 to 60%. The left ventricle has normal function. The left ventricle has no regional wall motion abnormalities. The left ventricular internal cavity size was normal in size. There is  no left ventricular hypertrophy. Left ventricular diastolic parameters are consistent with Grade I diastolic dysfunction (impaired relaxation). Elevated left atrial pressure. Right Ventricle: The right ventricular size is normal. No increase in right ventricular wall thickness. Right ventricular systolic function is normal. Tricuspid regurgitation signal is inadequate for assessing PA pressure. Left Atrium: Left atrial size was normal in size. Right Atrium: Right atrial size was normal in size. Pericardium: There is no evidence of pericardial effusion. Mitral  Valve: The mitral valve is normal in structure. Normal mobility of the mitral valve leaflets. Mild mitral annular calcification. No evidence of mitral valve regurgitation. No evidence of mitral valve stenosis. Tricuspid Valve: The tricuspid valve is normal in structure. Tricuspid valve regurgitation is not demonstrated. No evidence of tricuspid stenosis. Aortic Valve: The aortic valve was not well visualized. Aortic valve regurgitation is not visualized. No aortic stenosis is present. Pulmonic Valve: The pulmonic valve was normal in structure. Pulmonic valve regurgitation is not visualized. No evidence of pulmonic stenosis. Aorta: The aortic root is normal in size and structure. Venous: The inferior vena cava is normal in size with greater than 50% respiratory variability,  suggesting right atrial pressure of 3 mmHg. IAS/Shunts: No atrial level shunt detected by color flow Doppler.  LEFT VENTRICLE PLAX 2D LVIDd:         3.70 cm  Diastology LVIDs:         2.70 cm  LV e' lateral:   4.13 cm/s LV PW:         0.90 cm  LV E/e' lateral: 14.9 LV IVS:        0.80 cm  LV e' medial:    4.13 cm/s LVOT diam:     2.00 cm  LV E/e' medial:  14.9 LV SV:         60 LV SV Index:   30 LVOT Area:     3.14 cm  RIGHT VENTRICLE RV S prime:     9.25 cm/s TAPSE (M-mode): 2.0 cm LEFT ATRIUM             Index       RIGHT ATRIUM           Index LA diam:        2.30 cm 1.16 cm/m  RA Area:     15.00 cm LA Vol (A2C):   63.4 ml 31.96 ml/m RA Volume:   34.50 ml  17.39 ml/m LA Vol (A4C):   32.6 ml 16.43 ml/m LA Biplane Vol: 48.1 ml 24.24 ml/m  AORTIC VALVE LVOT Vmax:   83.60 cm/s LVOT Vmean:  64.300 cm/s LVOT VTI:    0.190 m  AORTA Ao Root diam: 2.80 cm MITRAL VALVE MV Area (PHT): 2.48 cm     SHUNTS MV Decel Time: 306 msec     Systemic VTI:  0.19 m MV E velocity: 61.70 cm/s   Systemic Diam: 2.00 cm MV A velocity: 116.00 cm/s MV E/A ratio:  0.53 Mihai Croitoru MD Electronically signed by Sanda Klein MD Signature Date/Time: 07/26/2019/2:19:01  PM    Final     ASSESSMENT / PLAN:  Acute hypoxic respiratory failure, increasing oxygen requirements Chest x-ray and exam is consistent with some degree of interstitial lung disease/fibrosis Chest x-ray suggest superimposed pneumonia, community-acquired  Recommend Proceed with high-resolution CT chest to clarify -if this suggest IPF then we may have to treat with high-dose steroids for IPF flare. He does not have any stigmata of collagen vascular disease, no specific environmental exposure obtained on history  Agree with treating for community-acquired organisms, obtain urine strep antigen and Legionella for completion  DNR status noted  Discussed with [facility] physician  Kara Mead MD. FCCP.  Pulmonary & Critical care  If no response to pager , please call 319 [phone]    07/28/2019, 12:20 PM

## 2019-07-28 NOTE — Progress Notes (Signed)
PROGRESS NOTE    Erik Logan  OEV:[id number] DOB: 01-01-1929 DOA: 07/16/2019 PCP: Neale Burly, MD    Brief Narrative:  Patient was admitted to the hospital with a working diagnosis of acute hypoxic respiratory failure due to multifocal community-acquired pneumonia (present on admission).  86 year old male with significant past medical history for hypertension, type 2 diabetes mellitus, coronary artery disease, chronic diastolic heart failure, and chronic kidney disease stage IV.  Patient reported worsening dyspnea for several weeks, at one point it was associated with lower extremity edema that transiently improved with increased dose of furosemide.  Due to persistent cough he received a short course of prednisone, with no significant improvement of his symptoms, he developed pleuritic chest pain mainly on the right side.  On his initial physical examination his oximetry was 80% on room air, blood pressure was 130/66, heart rate 75 and respiratory 23, his lungs had no significant wheezing or rales, heart S1-S2 present and rhythmic, tachycardic, abdomen soft, no lower extremity edema. Sodium 134, potassium 4.9, chloride 1 1, bicarb 21, glucose 417, BUN 34, creatinine 1.5.  Troponin I 105.  4.3, hemoglobin 15.4, hematocrit 46.5, platelets 146.  SARS Covid 19 negative.  Chest radiograph with multifocal interstitial infiltrates, all 4 quadrants.  EKG 81 bpm, left axis deviation, left anterior fascicular block, first degree AV block, sinus rhythm, no ST segment or T wave changes.   Patient developed respiratory distress and required non invasive mechanical ventilation and furosemide with improvement in his symptoms. Continue with broad spectrum antibiotic therapy.   Assessment & Plan:   Principal Problem:   Pneumonia Active Problems:   Essential (primary) hypertension   Type 2 diabetes mellitus without complication, with long-term current use of insulin (HCC)   CKD (chronic kidney disease)  stage 4, GFR 15-29 ml/min (HCC)   Chronic diastolic CHF (congestive heart failure) (HCC)   Acute respiratory failure with hypoxia (HCC)  Acute hypoxic respiratory failure due to multifocal community acquired pneumonia.  Patient's dyspnea has improved, now off non invasive mechanical ventilation, on 8 L/min of HFNC with oxygenation of 92%.  - Continue monitoring WBC which is grossly elevated but improved ?related to recent steroids. PCT up from previous, will trend. - Continue supplemental oxygen as needed to maintain SpO2 >90%, normal respiratory effort.  - Continue ceftriaxone, azithromycin.  - Cultures NGTD x3 days; sputum culture not resulted. - CXR this AM personally reviewed with persistent infiltrate, decreased lung volumes and suggestive of ILD. IS recommended.  COPD: Diagnosed at previous hospitalization, has extensive tobacco use history. No current wheezing. - Continue bronchodilators.  - Based on crackles ?IPF and based on clinical deterioration, suspect preexisting pulmonary function was worse than previously thought. PCCM consulted, HRCT ordered and pending.  Acute on chronic diastolic heart failure. Echocardiogram from 2019 with preserved LV systolic function 55 to 82%. Updated echocardiogram during this admission similar to prior w/G1DD, no new WMA's noted. IVC suggestive of normal RA pressures.  - Not currently peripherally overloaded. With AKI, will continue to hold diuretic.  - Continue metoprolol, isosorbide as below.  AKI on CKD stage IV with non gap metabolic acidosis: - Monitor off diuretic. Consider PVR/renal U/S if worsening despite holding diuretic. - Avoid hypotension and nephrotoxic medications.   T2DM with hypoglycemia: - Basal insulin restarted, will add low dose mealtime insulin. If steroids initiated, would need augmentation. - Continue SSI.  Dyslipidemia:  - Continue statin  CAD. No clinical signs of acute coronary syndrome, chest pain seems to be more  pleuritic. Troponin I elevation in the setting of decompensated diastolic heart failure is due to demand ischemia. No ischemic changes on ECG. - No inpatient work up planned.  - Continue home medications inclusive of isosorbide, beta blocker, statin.   GERD:  - Continue PPI  Status is: Inpatient Remains inpatient appropriate because:IV treatments appropriate due to intensity of illness or inability to take PO Remains with new oxygen requirement, not at respiratory baseline. Will transfer to PCU given worsening oxygenation.  Dispo: The patient is from: Home              Anticipated d/c is to: Home with home health              Anticipated d/c date is: >2 days              Patient currently is not medically stable to d/c.     DVT prophylaxis: Enoxaparin   Code Status:   DNR  Family Communication: None at bedside  Antimicrobials:   Ceftriaxone and azithromycin     Subjective: Worse today, less strength diffusely, more dyspneic. Significant worsening overnight, required BiPAP briefly.   Objective: Vitals:   07/28/19 0838 07/28/19 1000 07/28/19 1300 07/28/19 1625  BP:    115/63  Pulse:    87  Resp:    19  Temp:    (!) 97.5 F (36.4 C)  TempSrc:      SpO2: 90% 92% 90% 95%  Weight:      Height:        Intake/Output Summary (Last 24 hours) at 07/28/2019 1643 Last data filed at 07/28/2019 0600 Gross per 24 hour  Intake 400 ml  Output 650 ml  Net -250 ml   Filed Weights   08/08/2019 1930 07/25/19 0430 07/28/19 0500  Weight: 87.2 kg 84.6 kg 85.1 kg    Examination:  Gen: Pleasant, tired-appearing elderly male in no distress Pulm: Nonlabored breathing tachypnea on HFNC, Crackles bilaterally R > L. CV: Regular rate and rhythm. No murmur, rub, or gallop. No JVD, no dependent edema. GI: Abdomen soft, non-tender, non-distended, with normoactive bowel sounds.  Ext: Warm, no deformities Skin: No rashes, lesions or ulcers on visualized skin. Neuro: Alert and oriented. No focal  neurological deficits. Psych: Judgement and insight appear fair. Mood euthymic & affect congruent. Behavior is appropriate.    Data Reviewed: I have personally reviewed following labs and imaging studies  CBC: Recent Labs  Lab 07/29/2019 1952 07/25/19 0440 07/26/19 0236 07/27/19 0339 07/28/19 0323  WBC 24.3* 24.9* 27.8* 22.3* 18.1*  NEUTROABS  --  21.4* 24.2* 19.7*  --   HGB 15.4 14.8 13.9 12.7* 12.3*  HCT 46.5 44.9 42.1 37.8* 37.4*  MCV 89.3 89.4 88.4 88.7 87.6  PLT 146* 143* 141* 180 235   Basic Metabolic Panel: Recent Labs  Lab 07/28/2019 1952 07/25/19 0440 07/26/19 0236 07/27/19 0339 07/28/19 0323  NA 134* 136 141 135 133*  K 4.9 3.7 3.7 4.6 3.5  CL 101 106 108 105 104  CO2 21* 19* 19* 20* 18*  GLUCOSE 417* 350* 82 255* 271*  BUN 34* 37* 39* 53* 61*  CREATININE 1.85* 1.88* 1.99* 2.23* 2.34*  CALCIUM 8.7* 8.3* 8.3* 8.0* 8.1*   GFR: Estimated Creatinine Clearance: 23.2 mL/min (A) (by C-G formula based on SCr of 2.34 mg/dL (H)). Liver Function Tests: No results for input(s): AST, ALT, ALKPHOS, BILITOT, PROT, ALBUMIN in the last 168 hours. No results for input(s): LIPASE, AMYLASE in the last 168 hours. No results  for input(s): AMMONIA in the last 168 hours. Coagulation Profile: Recent Labs  Lab 08/03/2019 2241  INR 1.3*   Cardiac Enzymes: No results for input(s): CKTOTAL, CKMB, CKMBINDEX, TROPONINI in the last 168 hours. BNP (last 3 results) No results for input(s): PROBNP in the last 8760 hours. HbA1C: No results for input(s): HGBA1C in the last 72 hours. CBG: Recent Labs  Lab 07/27/19 1141 07/27/19 1605 07/27/19 2102 07/28/19 0749 07/28/19 1159  GLUCAP 247* 319* 297* 199* 221*   Lipid Profile: No results for input(s): CHOL, HDL, LDLCALC, TRIG, CHOLHDL, LDLDIRECT in the last 72 hours. Thyroid Function Tests: No results for input(s): TSH, T4TOTAL, FREET4, T3FREE, THYROIDAB in the last 72 hours. Anemia Panel: No results for input(s): VITAMINB12, FOLATE,  FERRITIN, TIBC, IRON, RETICCTPCT in the last 72 hours.    Radiology Studies: I have reviewed all of the imaging during this hospital visit personally     Scheduled Meds: . acetaminophen  650 mg Oral Q6H  . amLODipine  5 mg Oral Daily  . aspirin EC  81 mg Oral Daily  . atorvastatin  40 mg Oral Daily  . azithromycin  500 mg Oral Daily  . heparin  5,000 Units Subcutaneous Q8H  . insulin aspart  0-5 Units Subcutaneous QHS  . insulin aspart  0-9 Units Subcutaneous TID WC  . insulin aspart  2 Units Subcutaneous TID WC  . insulin detemir  5 Units Subcutaneous QHS  . isosorbide mononitrate  30 mg Oral Daily  . living well with diabetes book   Does not apply Once  . mouth rinse  15 mL Mouth Rinse BID  . metoprolol tartrate  12.5 mg Oral Daily  . mometasone-formoterol  2 puff Inhalation BID  .  morphine injection  1 mg Intravenous Once  . pantoprazole  40 mg Oral Daily  . sodium chloride flush  3 mL Intravenous Once  . sodium chloride flush  3 mL Intravenous Q12H  . sodium chloride flush  3 mL Intravenous Q12H   Continuous Infusions: . sodium chloride    . cefTRIAXone (ROCEPHIN)  IV Stopped (07/28/19 0700)     LOS: 3 days    Time spent: 35 minutes  Patrecia Pour, MD

## 2019-07-28 NOTE — Progress Notes (Signed)
Called to patient's bedside for low saturation and SOB after coughing.    Patient's finger tips and lips were a bluish color on 15 liter HFNC.    Pt placed on bipap.  BS diminished left greater than right.  Pt looks and feels much more comfortable on bipap Saturation 97%.  Finger tips pink.

## 2019-07-29 DIAGNOSIS — J84112 Idiopathic pulmonary fibrosis: Secondary | ICD-10-CM

## 2019-07-29 LAB — CULTURE, BLOOD (ROUTINE X 2)
Culture: NO GROWTH
Culture: NO GROWTH
Special Requests: ADEQUATE

## 2019-07-29 LAB — GLUCOSE, CAPILLARY
Glucose-Capillary: 217 mg/dL — ABNORMAL HIGH (ref 70–99)
Glucose-Capillary: 239 mg/dL — ABNORMAL HIGH (ref 70–99)
Glucose-Capillary: 258 mg/dL — ABNORMAL HIGH (ref 70–99)
Glucose-Capillary: 215 mg/dL — ABNORMAL HIGH (ref 70–99)

## 2019-07-29 LAB — LEGIONELLA PNEUMOPHILA SEROGP 1 UR AG: L. pneumophila Serogp 1 Ur Ag: NEGATIVE

## 2019-07-29 LAB — BASIC METABOLIC PANEL
Anion gap: 15 (ref 5–15)
BUN: 69 mg/dL — ABNORMAL HIGH (ref 8–23)
CO2: 16 mmol/L — ABNORMAL LOW (ref 22–32)
Calcium: 8.4 mg/dL — ABNORMAL LOW (ref 8.9–10.3)
Chloride: 104 mmol/L (ref 98–111)
Creatinine, Ser: 2.51 mg/dL — ABNORMAL HIGH (ref 0.61–1.24)
GFR calc Af Amer: 25 mL/min — ABNORMAL LOW (ref >60)
GFR calc non Af Amer: 22 mL/min — ABNORMAL LOW (ref >60)
Glucose, Bld: 199 mg/dL — ABNORMAL HIGH (ref 70–99)
Potassium: 3.8 mmol/L (ref 3.5–5.1)
Sodium: 135 mmol/L (ref 135–145)

## 2019-07-29 LAB — CBC
HCT: 40.2 % (ref 39.0–52.0)
Hemoglobin: 13.8 g/dL (ref 13.0–17.0)
MCH: 29.9 pg (ref 26.0–34.0)
MCHC: 34.3 g/dL (ref 30.0–36.0)
MCV: 87 fL (ref 80.0–100.0)
Platelets: 175 10*3/uL (ref 150–400)
RBC: 4.62 MIL/uL (ref 4.22–5.81)
RDW: 14.4 % (ref 11.5–15.5)
WBC: 18.9 10*3/uL — ABNORMAL HIGH (ref 4.0–10.5)
nRBC: 0 % (ref 0.0–0.2)

## 2019-07-29 LAB — PROCALCITONIN: Procalcitonin: 3.51 ng/mL

## 2019-07-29 LAB — SEDIMENTATION RATE: Sed Rate: 115 mm/hr — ABNORMAL HIGH (ref 0–16)

## 2019-07-29 LAB — MRSA PCR SCREENING: MRSA by PCR: NEGATIVE

## 2019-07-29 MED ORDER — ACETAMINOPHEN 325 MG PO TABS
650.0000 mg | ORAL_TABLET | Freq: Four times a day (QID) | ORAL | Status: DC
  Administered 2019-07-29 – 2019-08-02 (×15): 650 mg via ORAL
  Filled 2019-07-29 (×15): qty 2

## 2019-07-29 MED ORDER — INSULIN DETEMIR 100 UNIT/ML ~~LOC~~ SOLN
10.0000 [IU] | Freq: Every day | SUBCUTANEOUS | Status: DC
  Administered 2019-07-29: 10 [IU] via SUBCUTANEOUS
  Filled 2019-07-29 (×3): qty 0.1

## 2019-07-29 MED ORDER — SODIUM CHLORIDE 0.9 % IV SOLN
2.0000 g | INTRAVENOUS | Status: DC
  Administered 2019-07-29 – 2019-08-03 (×6): 2 g via INTRAVENOUS
  Filled 2019-07-29 (×7): qty 2

## 2019-07-29 MED ORDER — INSULIN ASPART 100 UNIT/ML ~~LOC~~ SOLN
0.0000 [IU] | Freq: Every day | SUBCUTANEOUS | Status: DC
  Administered 2019-07-29: 2 [IU] via SUBCUTANEOUS

## 2019-07-29 MED ORDER — INSULIN ASPART 100 UNIT/ML ~~LOC~~ SOLN
3.0000 [IU] | Freq: Three times a day (TID) | SUBCUTANEOUS | Status: DC
  Administered 2019-07-29 – 2019-07-30 (×4): 3 [IU] via SUBCUTANEOUS

## 2019-07-29 MED ORDER — METHYLPREDNISOLONE SODIUM SUCC 125 MG IJ SOLR
80.0000 mg | Freq: Four times a day (QID) | INTRAMUSCULAR | Status: DC
  Administered 2019-07-29 – 2019-07-30 (×5): 80 mg via INTRAVENOUS
  Filled 2019-07-29 (×5): qty 2

## 2019-07-29 MED ORDER — INSULIN ASPART 100 UNIT/ML ~~LOC~~ SOLN
0.0000 [IU] | Freq: Three times a day (TID) | SUBCUTANEOUS | Status: DC
  Administered 2019-07-29: 8 [IU] via SUBCUTANEOUS

## 2019-07-29 MED ORDER — VANCOMYCIN HCL IN DEXTROSE 1-5 GM/200ML-% IV SOLN
1000.0000 mg | INTRAVENOUS | Status: DC
  Administered 2019-07-29: 1000 mg via INTRAVENOUS
  Filled 2019-07-29: qty 200

## 2019-07-29 NOTE — Progress Notes (Signed)
Occupational Therapy Treatment Patient Details Name: Erik Logan MRN: [id number] DOB: July 12, 1928 Today's Date: 07/29/2019    History of present illness Patient is a 86 y/o male who presents with worsening SOB and chest pain. EKG- sinus rhythm with 1st degree AV node block. CXR 5/9- atelectasis right lung base. CXR- 5/10-bilteral infiltrates. Admitted with acute hypoxic respiratory failure secondry to PNA. On 5/10, pt requiring BiPAP due to CP and respiratory distress. On 5/13 pt with pulmonary consult and to have chest CT to evaluate for IPF.  PMH includes DM, HTN, NSTEMI, CAD, HLD, SCC.   OT comments  Patient continues to make steady progress towards goals in skilled OT session. Patient's session encompassed reiteration of energy conservation strategies, pursed lip breathing, and use of incentive spirometers in addition to basic ADLs. Pt continues to remain limited in activity due to oxygen saturation levels, requiring to be placed on HFNC at 15L in order to stay above 90 to complete toileting and functional standing tasks. Discharge recommendations remains appropriate; will continue to follow acutely.    Follow Up Recommendations  No OT follow up;Supervision - Intermittent    Equipment Recommendations  None recommended by OT    Recommendations for Other Services      Precautions / Restrictions Precautions Precautions: Fall Precaution Comments: watch 02, HFNC Restrictions Weight Bearing Restrictions: No       Mobility Bed Mobility               General bed mobility comments: Pt seated in bedside chair upon arrival  Transfers Overall transfer level: Needs assistance Equipment used: Rolling walker (2 wheeled) Transfers: Sit to/from Stand Sit to Stand: Min guard         General transfer comment: Cues for hand placement; sit to stand x 3    Balance Overall balance assessment: Needs assistance Sitting-balance support: Feet supported;No upper extremity  supported Sitting balance-Leahy Scale: Good     Standing balance support: During functional activity Standing balance-Leahy Scale: Fair Standing balance comment: Requires UE support in standing.                           ADL either performed or assessed with clinical judgement   ADL Overall ADL's : Needs assistance/impaired     Grooming: Wash/dry hands;Wash/dry face;Brushing hair;Supervision/safety;Standing Grooming Details (indicate cue type and reason): While standing at the sink                 Toilet Transfer: Min guard;Ambulation;BSC;Minimal assistance Toilet Transfer Details (indicate cue type and reason): Cues for hand placement and body positioning Toileting- Clothing Manipulation and Hygiene: Set up;Sit to/from stand;Minimal assistance Toileting - Clothing Manipulation Details (indicate cue type and reason): Unable to ambulate to bathroom in time due to urgency and lines     Functional mobility during ADLs: Min guard;Rolling walker General ADL Comments: Pt stood at sink for 2-3 minutes to complete ADLs, suddenly having urgency to have a BM, utilized BSC, pt continues to require increased O2 when completing functional tasks, RN made aware     Vision       Perception     Praxis      Cognition Arousal/Alertness: Awake/alert Behavior During Therapy: WFL for tasks assessed/performed Overall Cognitive Status: Within Functional Limits for tasks assessed                                 General  Comments: Pt pleasant and willing to participate in therapy tasks.         Exercises     Shoulder Instructions       General Comments      Pertinent Vitals/ Pain       Pain Assessment: No/denies pain  Home Living                                          Prior Functioning/Environment              Frequency  Min 2X/week        Progress Toward Goals  OT Goals(current goals can now be found in the care plan  section)  Progress towards OT goals: Progressing toward goals  Acute Rehab OT Goals Patient Stated Goal: to go home Time For Goal Achievement: 08/10/19 Potential to Achieve Goals: Good  Plan Discharge plan remains appropriate    Co-evaluation                 AM-PAC OT "6 Clicks" Daily Activity     Outcome Measure   Help from another person eating meals?: None Help from another person taking care of personal grooming?: A Little Help from another person toileting, which includes using toliet, bedpan, or urinal?: A Little Help from another person bathing (including washing, rinsing, drying)?: A Little Help from another person to put on and taking off regular upper body clothing?: A Little Help from another person to put on and taking off regular lower body clothing?: A Little 6 Click Score: 19    End of Session Equipment Utilized During Treatment: Gait belt;Rolling walker;Oxygen  OT Visit Diagnosis: Unsteadiness on feet (R26.81);Other (comment)   Activity Tolerance Patient tolerated treatment well   Patient Left in chair;with call bell/phone within reach   Nurse Communication Mobility status        Time: 8828-0034 OT Time Calculation (min): 26 min  Charges: OT General Charges $OT Visit: 1 Visit OT Treatments $Self Care/Home Management : 23-37 mins  Corinne Ports E. Shyane Fossum, COTA/L Acute Rehabilitation Services [place] 07/29/2019, 12:45 PM

## 2019-07-29 NOTE — Progress Notes (Signed)
Pharmacy Antibiotic Note  Erik Logan is a 86 y.o. male admitted on 07/20/2019 with PNA  SCr 2.51 - aki  Plan: Cefepime 2 g q24h Vanc 1 g q24h Monitor renal fx cx vanc lvls prn  Height: 5\' 8"  (172.7 cm) Weight: 85.1 kg (187 lb 9.8 oz) IBW/kg (Calculated) : 68.4  Temp (24hrs), Avg:97.9 F (36.6 C), Min:97.5 F (36.4 C), Max:98.2 F (36.8 C)  Recent Labs  Lab 08/12/2019 1952 07/17/2019 2241 07/25/19 0026 07/25/19 0440 07/25/19 0720 07/26/19 0236 07/27/19 0339 07/28/19 0323 07/29/19 0436  WBC   < >  --   --  24.9*  --  27.8* 22.3* 18.1* 18.9*  CREATININE   < >  --   --  1.88*  --  1.99* 2.23* 2.34* 2.51*  LATICACIDVEN  --  2.0* 2.5*  --  2.2*  --   --   --   --    < > = values in this interval not displayed.    Estimated Creatinine Clearance: 21.6 mL/min (A) (by C-G formula based on SCr of 2.51 mg/dL (H)).    Allergies  Allergen Reactions  . Beef-Derived Products Hives    Red meat  . Sulfa Antibiotics Swelling   Barth Kirks, PharmD, BCPS, BCCCP Clinical Pharmacist [phone]  Please check AMION for all [facility] numbers  07/29/2019 7:44 AM

## 2019-07-29 NOTE — Progress Notes (Signed)
PROGRESS NOTE    Erik Logan  ZYS:[id number] DOB: 01-26-1929 DOA: 07/17/2019 PCP: Neale Burly, MD    Brief Narrative:  Patient was admitted to the hospital with a working diagnosis of acute hypoxic respiratory failure due to multifocal community-acquired pneumonia (present on admission).  86 year old male with significant past medical history for hypertension, type 2 diabetes mellitus, coronary artery disease, chronic diastolic heart failure, and chronic kidney disease stage IV.  Patient reported worsening dyspnea for several weeks, at one point it was associated with lower extremity edema that transiently improved with increased dose of furosemide.  Due to persistent cough he received a short course of prednisone, with no significant improvement of his symptoms, he developed pleuritic chest pain mainly on the right side.  On his initial physical examination his oximetry was 80% on room air, blood pressure was 130/66, heart rate 75 and respiratory 23, his lungs had no significant wheezing or rales, heart S1-S2 present and rhythmic, tachycardic, abdomen soft, no lower extremity edema. Sodium 134, potassium 4.9, chloride 1 1, bicarb 21, glucose 417, BUN 34, creatinine 1.5.  Troponin I 105.  4.3, hemoglobin 15.4, hematocrit 46.5, platelets 146.  SARS Covid 19 negative.  Chest radiograph with multifocal interstitial infiltrates, all 4 quadrants.  EKG 81 bpm, left axis deviation, left anterior fascicular block, first degree AV block, sinus rhythm, no ST segment or T wave changes.   Patient developed respiratory distress and required non invasive mechanical ventilation and furosemide with improvement in his symptoms. Continue with broad spectrum antibiotic therapy.   Assessment & Plan:   Principal Problem:   Pneumonia Active Problems:   Essential (primary) hypertension   Type 2 diabetes mellitus without complication, with long-term current use of insulin (HCC)   CKD (chronic kidney disease)  stage 4, GFR 15-29 ml/min (HCC)   Chronic diastolic CHF (congestive heart failure) (HCC)   Acute respiratory failure with hypoxia (HCC)  Acute hypoxic respiratory failure due to multifocal community acquired pneumonia and suspected IPF flare: HRCT suggestive of UIP.  - Start high dose steroids today per pulmonology - PCT rising significantly with clinical worsening, will broaden abx, send MRSA PCR and sputum Cx.  - Continue supplemental oxygen as needed to maintain SpO2 >90%, normal respiratory effort.  - Cultures NGTD x3 days; sputum culture sent.  COPD: Diagnosed at previous hospitalization, has extensive tobacco use history. No current wheezing. - Continue bronchodilators.  - Based on crackles ?IPF and based on clinical deterioration, suspect preexisting pulmonary function was worse than previously thought. PCCM consulted, HRCT ordered and pending.  Acute on chronic diastolic heart failure. Echocardiogram from 2019 with preserved LV systolic function 55 to 93%. Updated echocardiogram during this admission similar to prior w/G1DD, no new WMA's noted. IVC suggestive of normal RA pressures.  - Not currently peripherally overloaded. With AKI, will continue to hold diuretic.  - Continue metoprolol, isosorbide as below.  AKI on CKD stage IV with non gap metabolic acidosis: - Monitor off diuretic. Consider PVR/renal U/S if worsening despite holding diuretic. - Avoid hypotension and nephrotoxic medications.   T2DM with hypoglycemia: - Increase basal, augment to moderate SSI with increased mealtime insulin. CBGs elevated currently, only likely to worsen with steroids. Anticipate continued augmentation. - Continue SSI.  Dyslipidemia:  - Continue statin  CAD. No clinical signs of acute coronary syndrome, chest pain seems to be more pleuritic. Troponin I elevation in the setting of decompensated diastolic heart failure is due to demand ischemia. No ischemic changes on ECG. - No  inpatient work  up planned.  - Continue home medications inclusive of isosorbide, beta blocker, statin.   GERD:  - Continue PPI  Status is: Inpatient Remains inpatient appropriate because:IV treatments appropriate due to intensity of illness or inability to take PO Continues to have respiratory distress.   Dispo: The patient is from: Home              Anticipated d/c is to: Home with home health              Anticipated d/c date is: >3 days              Patient currently is not medically stable to d/c.     DVT prophylaxis: Enoxaparin   Code Status:   DNR  Family Communication: None at bedside; called son yesterday. Per his request, will call him later this afternoon as well.  Antimicrobials:   Ceftriaxone and azithromycin    Subjective: Breathing about the same, dyspneic at rest, required BiPAP overnight. No chest pain or swelling. Coughing more currently.   Objective: Vitals:   07/29/19 0819 07/29/19 0827 07/29/19 1224 07/29/19 1300  BP:    (!) 142/60  Pulse:    99  Resp:    (!) 22  Temp: (!) 97.2 F (36.2 C)   97.6 F (36.4 C)  TempSrc: Oral   Oral  SpO2:  93% (!) 84% 92%  Weight:      Height:        Intake/Output Summary (Last 24 hours) at 07/29/2019 1526 Last data filed at 07/29/2019 0600 Gross per 24 hour  Intake --  Output 450 ml  Net -450 ml   Filed Weights   08/03/2019 1930 07/25/19 0430 07/28/19 0500  Weight: 87.2 kg 84.6 kg 85.1 kg    Examination:  Gen: Elderly, pleasant male in no distress Pulm: Nonlabored tachypnea on 15L HFNC with bilateral crackles. CV: Regular rate and rhythm. No murmur, rub, or gallop. No JVD, no dependent edema. GI: Abdomen soft, non-tender, non-distended, with normoactive bowel sounds.  Ext: Warm, no deformities Skin: No rashes, lesions or ulcers on visualized skin. Neuro: Alert and oriented. No focal neurological deficits. Psych: Judgement and insight appear fair. Mood euthymic & affect congruent. Behavior is appropriate.    Data Reviewed:  I have personally reviewed following labs and imaging studies  CBC: Recent Labs  Lab 07/25/19 0440 07/26/19 0236 07/27/19 0339 07/28/19 0323 07/29/19 0436  WBC 24.9* 27.8* 22.3* 18.1* 18.9*  NEUTROABS 21.4* 24.2* 19.7*  --   --   HGB 14.8 13.9 12.7* 12.3* 13.8  HCT 44.9 42.1 37.8* 37.4* 40.2  MCV 89.4 88.4 88.7 87.6 87.0  PLT 143* 141* 180 161 382   Basic Metabolic Panel: Recent Labs  Lab 07/25/19 0440 07/26/19 0236 07/27/19 0339 07/28/19 0323 07/29/19 0436  NA 136 141 135 133* 135  K 3.7 3.7 4.6 3.5 3.8  CL 106 108 105 104 104  CO2 19* 19* 20* 18* 16*  GLUCOSE 350* 82 255* 271* 199*  BUN 37* 39* 53* 61* 69*  CREATININE 1.88* 1.99* 2.23* 2.34* 2.51*  CALCIUM 8.3* 8.3* 8.0* 8.1* 8.4*   Coagulation Profile: Recent Labs  Lab 07/23/2019 2241  INR 1.3*   CBG: Recent Labs  Lab 07/28/19 1159 07/28/19 1724 07/28/19 2120 07/29/19 0821 07/29/19 1225  GLUCAP 221* 200* 234* 217* 239*   Scheduled Meds: . acetaminophen  650 mg Oral Q6H  . amLODipine  5 mg Oral Daily  . aspirin EC  81 mg  Oral Daily  . atorvastatin  40 mg Oral Daily  . heparin  5,000 Units Subcutaneous Q8H  . insulin aspart  0-15 Units Subcutaneous TID WC  . insulin aspart  0-5 Units Subcutaneous QHS  . insulin aspart  3 Units Subcutaneous TID WC  . insulin detemir  10 Units Subcutaneous QHS  . isosorbide mononitrate  30 mg Oral Daily  . living well with diabetes book   Does not apply Once  . mouth rinse  15 mL Mouth Rinse BID  . methylPREDNISolone (SOLU-MEDROL) injection  80 mg Intravenous Q6H  . metoprolol tartrate  12.5 mg Oral Daily  . mometasone-formoterol  2 puff Inhalation BID  .  morphine injection  1 mg Intravenous Once  . pantoprazole  40 mg Oral Daily  . sodium chloride flush  3 mL Intravenous Once  . sodium chloride flush  3 mL Intravenous Q12H  . sodium chloride flush  3 mL Intravenous Q12H   Continuous Infusions: . sodium chloride    . ceFEPime (MAXIPIME) IV 2 g (07/29/19 0933)      LOS: 4 days    Time spent: 35 minutes  Patrecia Pour, MD

## 2019-07-29 NOTE — Progress Notes (Signed)
Inpatient Diabetes Program Recommendations  AACE/ADA: New Consensus Statement on Inpatient Glycemic Control   Target Ranges:  Prepandial:   less than 140 mg/dL      Peak postprandial:   less than 180 mg/dL (1-2 hours)      Critically ill patients:  140 - 180 mg/dL   Results for Erik Logan, Erik Logan (MRN [id number]) as of 07/29/2019 11:16  Ref. Range 07/28/2019 07:49 07/28/2019 11:59 07/28/2019 17:24 07/28/2019 21:20 07/29/2019 08:21  Glucose-Capillary Latest Ref Range: 70 - 99 mg/dL 199 (H) 221 (H) 200 (H) 234 (H) 217 (H)   Review of Glycemic Control  Diabetes history: DM2 Outpatient Diabetes medications: Toujeo 20 units BID, Humalog5 units with meals plussliding scalefor hyperglycemia Current orders for Inpatient glycemic control: Levemir 5 units QHS, Novolog 0-9 units TID with meals, Novolog 0-5 units QHS, Novolog 2 units TID with meals  Inpatient Diabetes Program Recommendations:    Insulin-Basal: Please consider increasing Levemir to 8 units QHS.  Insulin-Meal Coverage: Please consider increasing meal coverage to Novolog 3 units TID with meals.  HbgA1C:A1C 10.7% on 5/10/21indicating an average glucose of 260 mg/dl over the past 2-3 months.Patient noted he was concerned about hypoglycemiaat home since he has lost weight. Also based on glucose trends while inpatient, likely need to decrease outpatient insulin regimenat time of discharge.  Thanks, Barnie Alderman, RN, MSN, CDE Diabetes Coordinator Inpatient Diabetes Program [phone] (Team Pager from 8am to 5pm)

## 2019-07-29 NOTE — Consult Note (Signed)
Name: Erik Logan MRN: [id number] DOB: 1929/03/07    ADMISSION DATE:  08/02/2019 CONSULTATION DATE:  07/29/2019   REFERRING MD :  Bonner Puna, triad   CHIEF COMPLAINT: Respiratory distress with hypoxia  BRIEF PATIENT DESCRIPTION: 86 year old remote smoker with dyspnea for several months and acute hypoxic respiratory failure with concern for pulmonary fibrosis and pneumonia  SIGNIFICANT EVENTS  5/9 hospital admission  STUDIES:  HRCT 5/13>>   HISTORY OF PRESENT ILLNESS: 86 year old man admitted with a history of dyspnea worsening for several months.  He reports an occasional dry cough.  He was treated for lower extremity edema as an outpatient with Lasix which improved his edema but did not affect his shortness of breath.  He was admitted to [facility] in December and had a chest x-ray done and was told that he had "COPD".  this progressed to class IV symptoms, on admission oxygen saturation was 80% on room air.  Covid testing was negative both in December and on this admit. I have reviewed his records in care everywhere and I do not have any record of previous chest x-ray or other imaging Required 8 L nasal cannula in the last 24 hours has required up to 15 L and BiPAP overnight, hence PCCM consulted He is being treated for community-acquired pneumonia with ceftriaxone and azithromycin Afebrile last 3 days  He is a resident of [facility], worked in the school system for 20 years, lives in an older home built in the 60s, has carpets, has always had pets except for the last 5 years, no birds no other sick contacts, no mold exposure      SUBJECTIVE:  Awake alert no acute distress remains hospitalized VITAL SIGNS: Temp:  [97.2 F (36.2 C)-98.2 F (36.8 C)] 97.2 F (36.2 C) (05/14 0819) Pulse Rate:  [82-96] 82 (05/14 0410) Resp:  [19-28] 27 (05/14 0410) BP: (115-136)/(60-63) 132/60 (05/14 0400) SpO2:  [88 %-97 %] 93 % (05/14 0827) FiO2 (%):  [60 %] 60 % (05/14  0143)  PHYSICAL EXAMINATION: General: Elderly male no acute distress reported using BiPAP during the night HEENT: No JVD or lymphadenopathy is appreciated Neuro: Grossly intact CV: Heart sounds are regular PULM: Diminished throughout, 10 L flow with O2 sat 100  GI: soft, bsx4 active  Extremities: warm/dry,  edema  Skin: no rashes or lesions   Recent Labs  Lab 07/27/19 0339 07/28/19 0323 07/29/19 0436  NA 135 133* 135  K 4.6 3.5 3.8  CL 105 104 104  CO2 20* 18* 16*  BUN 53* 61* 69*  CREATININE 2.23* 2.34* 2.51*  GLUCOSE 255* 271* 199*   Recent Labs  Lab 07/27/19 0339 07/28/19 0323 07/29/19 0436  HGB 12.7* 12.3* 13.8  HCT 37.8* 37.4* 40.2  WBC 22.3* 18.1* 18.9*  PLT 180 161 175   DG CHEST PORT 1 VIEW  Result Date: 07/28/2019 CLINICAL DATA:  Respiratory failure. EXAM: PORTABLE CHEST 1 VIEW COMPARISON:  Chest x-ray dated Jul 25, 2019. FINDINGS: Lordotic positioning. Stable cardiomediastinal silhouette. Normal pulmonary vascularity. Poor inspiration compared to the prior study. Peripheral and basilar predominant coarse interstitial markings again noted with improving superimposed consolidation in the right lung. No pleural effusion or pneumothorax. No acute osseous abnormality. IMPRESSION: 1. Improving right-sided pneumonia superimposed on chronic interstitial lung disease. Electronically Signed   By: Titus Dubin M.D.   On: 07/28/2019 08:43    ASSESSMENT / PLAN:  Acute hypoxic respiratory failure, increasing oxygen requirements Chest x-ray and exam is consistent with some degree  of interstitial lung disease/fibrosis Chest x-ray suggest superimposed pneumonia, community-acquired  Plan: High-resolution CT has been performed Continue steroids as of IPF flare Continue empirical antimicrobial therapy Urine strep is negative  DNR status noted  [name] Jayshaun Phillips ACNP Acute Care Nurse Practitioner [place] Please consult [place] 07/29/2019, 11:12  AM

## 2019-07-30 DIAGNOSIS — J432 Centrilobular emphysema: Secondary | ICD-10-CM

## 2019-07-30 DIAGNOSIS — R768 Other specified abnormal immunological findings in serum: Secondary | ICD-10-CM

## 2019-07-30 DIAGNOSIS — J189 Pneumonia, unspecified organism: Secondary | ICD-10-CM

## 2019-07-30 LAB — CBC
HCT: 34.4 % — ABNORMAL LOW (ref 39.0–52.0)
Hemoglobin: 11.6 g/dL — ABNORMAL LOW (ref 13.0–17.0)
MCH: 29.1 pg (ref 26.0–34.0)
MCHC: 33.7 g/dL (ref 30.0–36.0)
MCV: 86.4 fL (ref 80.0–100.0)
Platelets: 205 10*3/uL (ref 150–400)
RBC: 3.98 MIL/uL — ABNORMAL LOW (ref 4.22–5.81)
RDW: 14.6 % (ref 11.5–15.5)
WBC: 18.8 10*3/uL — ABNORMAL HIGH (ref 4.0–10.5)
nRBC: 0 % (ref 0.0–0.2)

## 2019-07-30 LAB — BASIC METABOLIC PANEL
Anion gap: 11 (ref 5–15)
BUN: 80 mg/dL — ABNORMAL HIGH (ref 8–23)
CO2: 17 mmol/L — ABNORMAL LOW (ref 22–32)
Calcium: 8.2 mg/dL — ABNORMAL LOW (ref 8.9–10.3)
Chloride: 103 mmol/L (ref 98–111)
Creatinine, Ser: 2.46 mg/dL — ABNORMAL HIGH (ref 0.61–1.24)
GFR calc Af Amer: 26 mL/min — ABNORMAL LOW (ref >60)
GFR calc non Af Amer: 23 mL/min — ABNORMAL LOW (ref >60)
Glucose, Bld: 296 mg/dL — ABNORMAL HIGH (ref 70–99)
Potassium: 4 mmol/L (ref 3.5–5.1)
Sodium: 131 mmol/L — ABNORMAL LOW (ref 135–145)

## 2019-07-30 LAB — GLUCOSE, CAPILLARY
Glucose-Capillary: 249 mg/dL — ABNORMAL HIGH (ref 70–99)
Glucose-Capillary: 393 mg/dL — ABNORMAL HIGH (ref 70–99)
Glucose-Capillary: 444 mg/dL — ABNORMAL HIGH (ref 70–99)
Glucose-Capillary: 379 mg/dL — ABNORMAL HIGH (ref 70–99)

## 2019-07-30 LAB — RHEUMATOID FACTOR: Rheumatoid fact SerPl-aCnc: 111.6 IU/mL — ABNORMAL HIGH (ref 0.0–13.9)

## 2019-07-30 MED ORDER — INSULIN ASPART 100 UNIT/ML ~~LOC~~ SOLN
0.0000 [IU] | Freq: Three times a day (TID) | SUBCUTANEOUS | Status: DC
  Administered 2019-07-30: 20 [IU] via SUBCUTANEOUS
  Administered 2019-07-30: 5 [IU] via SUBCUTANEOUS
  Administered 2019-07-31: 7 [IU] via SUBCUTANEOUS
  Administered 2019-07-31: 3 [IU] via SUBCUTANEOUS
  Administered 2019-07-31: 11 [IU] via SUBCUTANEOUS
  Administered 2019-08-01: 3 [IU] via SUBCUTANEOUS
  Administered 2019-08-01: 1 [IU] via SUBCUTANEOUS
  Administered 2019-08-02: 7 [IU] via SUBCUTANEOUS
  Administered 2019-08-02: 4 [IU] via SUBCUTANEOUS
  Administered 2019-08-03: 7 [IU] via SUBCUTANEOUS

## 2019-07-30 MED ORDER — METHYLPREDNISOLONE SODIUM SUCC 40 MG IJ SOLR
40.0000 mg | Freq: Three times a day (TID) | INTRAMUSCULAR | Status: DC
  Administered 2019-07-30 – 2019-07-31 (×2): 40 mg via INTRAVENOUS
  Filled 2019-07-30 (×4): qty 1

## 2019-07-30 MED ORDER — INSULIN DETEMIR 100 UNIT/ML ~~LOC~~ SOLN
20.0000 [IU] | Freq: Two times a day (BID) | SUBCUTANEOUS | Status: DC
  Administered 2019-07-30 – 2019-08-03 (×8): 20 [IU] via SUBCUTANEOUS
  Filled 2019-07-30 (×10): qty 0.2

## 2019-07-30 MED ORDER — INSULIN DETEMIR 100 UNIT/ML ~~LOC~~ SOLN
10.0000 [IU] | Freq: Two times a day (BID) | SUBCUTANEOUS | Status: DC
  Administered 2019-07-30: 10 [IU] via SUBCUTANEOUS
  Filled 2019-07-30 (×2): qty 0.1

## 2019-07-30 MED ORDER — INSULIN ASPART 100 UNIT/ML ~~LOC~~ SOLN
0.0000 [IU] | Freq: Every day | SUBCUTANEOUS | Status: DC
  Administered 2019-07-30: 5 [IU] via SUBCUTANEOUS

## 2019-07-30 MED ORDER — INSULIN ASPART 100 UNIT/ML ~~LOC~~ SOLN
5.0000 [IU] | Freq: Three times a day (TID) | SUBCUTANEOUS | Status: DC
  Administered 2019-08-02: 5 [IU] via SUBCUTANEOUS

## 2019-07-30 NOTE — Progress Notes (Signed)
Name: Erik Logan MRN: [id number] DOB: December 16, 1928    ADMISSION DATE:  08/05/2019 CONSULTATION DATE:  07/30/2019   REFERRING MD :  Erik Logan, triad   CHIEF COMPLAINT: Respiratory distress with hypoxia  BRIEF PATIENT DESCRIPTION: 86 year old remote smoker with dyspnea for several months and acute hypoxic respiratory failure with concern for pulmonary fibrosis and pneumonia  SIGNIFICANT EVENTS  5/9 hospital admission  STUDIES:  HRCT 5/13>>   HISTORY OF PRESENT ILLNESS: 86 year old man admitted with a history of dyspnea worsening for several months.  He reports an occasional dry cough.  He was treated for lower extremity edema as an outpatient with Lasix which improved his edema but did not affect his shortness of breath.  He was admitted to [facility] in December and had a chest x-ray done and was told that he had "COPD".  this progressed to class IV symptoms, on admission oxygen saturation was 80% on room air.  Covid testing was negative both in December and on this admit. I have reviewed his records in care everywhere and I do not have any record of previous chest x-ray or other imaging Required 8 L nasal cannula in the last 24 hours has required up to 15 L and BiPAP overnight, hence PCCM consulted He is being treated for community-acquired pneumonia with ceftriaxone and azithromycin Afebrile last 3 days  He is a resident of [facility], worked in the school system for 20 years, lives in an older home built in the 60s, has carpets, has always had pets except for the last 5 years, no birds no other sick contacts, no mold exposure      SUBJECTIVE:  Decreased o 2 needs noted VITAL SIGNS: Temp:  [97.6 F (36.4 C)-98.5 F (36.9 C)] 97.7 F (36.5 C) (05/15 1154) Pulse Rate:  [88-99] 88 (05/15 1154) Resp:  [15-29] 25 (05/15 1154) BP: (110-160)/(54-78) 110/59 (05/15 1154) SpO2:  [84 %-99 %] 99 % (05/15 1154)  PHYSICAL EXAMINATION: General: Well-nourished well-developed  male who is in less respiratory distress today HEENT: No JVD or lymphadenopathy is appreciated Neuro: Grossly intact CV: Heart sounds regular PULM: Decreased breath sounds throughout Currently on 8 L nasal cannula T  GI: soft, bsx4 active : Extremities: warm/dry,  edema  Skin: no rashes or lesions   Recent Labs  Lab 07/28/19 0323 07/29/19 0436 07/30/19 0228  NA 133* 135 131*  K 3.5 3.8 4.0  CL 104 104 103  CO2 18* 16* 17*  BUN 61* 69* 80*  CREATININE 2.34* 2.51* 2.46*  GLUCOSE 271* 199* 296*   Recent Labs  Lab 07/28/19 0323 07/29/19 0436 07/30/19 0228  HGB 12.3* 13.8 11.6*  HCT 37.4* 40.2 34.4*  WBC 18.1* 18.9* 18.8*  PLT 161 175 205   CT Chest High Resolution  Result Date: 07/29/2019 CLINICAL DATA:  Inpatient. Evaluate for interstitial lung disease. EXAM: CT CHEST WITHOUT CONTRAST TECHNIQUE: Multidetector CT imaging of the chest was performed following the standard protocol without intravenous contrast. High resolution imaging of the lungs, as well as inspiratory and expiratory imaging, was performed. COMPARISON:  Chest radiograph from earlier today. FINDINGS: Cardiovascular: Normal heart size. No significant pericardial effusion/thickening. Three-vessel coronary atherosclerosis. Atherosclerotic nonaneurysmal thoracic aorta. Top-normal caliber main pulmonary artery (3.1 cm diameter). Mediastinum/Nodes: No discrete thyroid nodules. Unremarkable esophagus. No axillary adenopathy. Mild right paratracheal adenopathy up to 1.3 cm (series 5/image 68). Mildly enlarged 1.3 cm subcarinal node (series 5/image 86). Mildly enlarged 1.0 cm AP window node (series 5/image 66). Mild bilateral hilar lymphadenopathy,  poorly delineated on these noncontrast images. Lungs/Pleura: No pneumothorax. No pleural effusion. Severe centrilobular emphysema with diffuse bronchial wall thickening. Moderate patchy consolidation and ground-glass opacity throughout both lungs, most prominent in the upper lobes  bilaterally. There is patchy reticulation and ground-glass opacity throughout both lungs with associated mild traction bronchiectasis and mild architectural distortion. No clear apicobasilar gradient to these findings. Mild apparent honeycombing at the lung bases, most prominent at the peripheral right lung base (series 9/images 11 and 15). No patchy lobular air trapping or evidence of tracheobronchomalacia on the expiration sequence. Clustered nodular irregular opacities in the left lower lobe, largest 1.0 cm (series 6/image 82). No discrete lung masses. Upper abdomen: Small hiatal hernia. Musculoskeletal: No aggressive appearing focal osseous lesions. Moderate thoracic spondylosis. IMPRESSION: 1. Moderate patchy consolidation and ground-glass opacity throughout both lungs, most prominent in the upper lobes bilaterally, compatible with multilobar pneumonia. 2. Clustered irregular nodular opacities in the left lower lobe, largest 1.0 cm, indeterminate for inflammatory versus neoplastic etiology. Recommend attention on follow-up high-resolution chest CT in 3 months. 3. Spectrum of findings suggestive of underlying fibrotic interstitial lung disease with mild honeycombing. Usual interstitial pneumonia (UIP) suspected, although evaluation is limited by the presence of the superimposed multilobar pneumonia. Suggest follow-up high-resolution chest CT study in 3 months. 4. Mild mediastinal and bilateral hilar lymphadenopathy, nonspecific, most likely reactive. 5. Small hiatal hernia. 6. Aortic Atherosclerosis (ICD10-I70.0) and Emphysema (ICD10-J43.9). Electronically Signed   By: Ilona Sorrel M.D.   On: 07/29/2019 12:57    ASSESSMENT / PLAN:  Acute hypoxic respiratory failure, increasing oxygen requirements Chest x-ray and exam is consistent with some degree of interstitial lung disease/fibrosis Chest x-ray suggest superimposed pneumonia, community-acquired  Plan: FiO2 needs are decreasing daily Continue  steroids Pulmonary critical care will follow up again on Monday Goal is to get him off of all oxygen if possible Continue cefepime cultures remain pending  DNR status noted  Erik Landry Danialle Dement ACNP Acute Care Nurse Practitioner [place] Please consult [place] 07/30/2019, 12:16 PM

## 2019-07-30 NOTE — Progress Notes (Signed)
PROGRESS NOTE    Erik Logan  TML:[id number] DOB: [birth year] DOA: 08/05/2019 PCP: Neale Burly, MD    Brief Narrative:  Patient was admitted to the hospital with a working diagnosis of acute hypoxic respiratory failure due to multifocal community-acquired pneumonia (present on admission).  86 year old male with significant past medical history for hypertension, type 2 diabetes mellitus, coronary artery disease, chronic diastolic heart failure, and chronic kidney disease stage IV.  Patient reported worsening dyspnea for several weeks, at one point it was associated with lower extremity edema that transiently improved with increased dose of furosemide.  Due to persistent cough he received a short course of prednisone, with no significant improvement of his symptoms, he developed pleuritic chest pain mainly on the right side.  On his initial physical examination his oximetry was 80% on room air, blood pressure was 130/66, heart rate 75 and respiratory 23, his lungs had no significant wheezing or rales, heart S1-S2 present and rhythmic, tachycardic, abdomen soft, no lower extremity edema. Sodium 134, potassium 4.9, chloride 1 1, bicarb 21, glucose 417, BUN 34, creatinine 1.5.  Troponin I 105.  4.3, hemoglobin 15.4, hematocrit 46.5, platelets 146.  SARS Covid 19 negative.  Chest radiograph with multifocal interstitial infiltrates, all 4 quadrants.  EKG 81 bpm, left axis deviation, left anterior fascicular block, first degree AV block, sinus rhythm, no ST segment or T wave changes.   Patient developed respiratory distress and required non invasive mechanical ventilation and furosemide with improvement in his symptoms. Continue with broad spectrum antibiotic therapy.   Assessment & Plan:   Principal Problem:   Pneumonia Active Problems:   Essential (primary) hypertension   Type 2 diabetes mellitus without complication, with long-term current use of insulin (HCC)   CKD (chronic kidney disease)  stage 4, GFR 15-29 ml/min (HCC)   Chronic diastolic CHF (congestive heart failure) (HCC)   Acute respiratory failure with hypoxia (HCC)  Acute hypoxic respiratory failure due to multifocal community acquired pneumonia and suspected IPF flare: HRCT suggestive of UIP.  - Started steroids 5/14, hypoxemia improved.  - Continue cefepime. MRSA PCR negative - Sputum Cx pending. - Continue supplemental oxygen as needed to maintain SpO2 >90%, normal respiratory effort.  - Cultures NGTD  COPD: Diagnosed at previous hospitalization, has extensive tobacco use history. No current wheezing. - Continue bronchodilators. Steroids as above.  Acute on chronic diastolic heart failure. Echocardiogram from 2019 with preserved LV systolic function 55 to 68%. Updated echocardiogram during this admission similar to prior w/G1DD, no new WMA's noted. IVC suggestive of normal RA pressures.  - Not currently peripherally overloaded. AKI stable. - Continue metoprolol, isosorbide as below.  AKI on CKD stage IV with non gap metabolic acidosis: - Monitor off diuretic. Consider PVR/renal U/S if worsening. - Avoid hypotension and nephrotoxic medications.   T2DM with hypoglycemia: - Made upward adjustments of insulin this morning in response to steroid-induced hyperglycemia. Severely elevated, will double long-acting insulin, increase novolog dosing and monitor.  - Continue SSI.  Dyslipidemia:  - Continue statin  CAD s/p PCI: No clinical signs of acute coronary syndrome, chest pain seems to be more pleuritic. Troponin I elevation in the setting of decompensated diastolic heart failure is due to demand ischemia. No ischemic changes on ECG. - No inpatient work up planned.  - Continue home medications inclusive of isosorbide, beta blocker, statin.   GERD:  - Continue PPI  Status is: Inpatient Remains inpatient appropriate because:IV treatments appropriate due to intensity of illness or inability to take  PO Continues  to have respiratory distress.   Dispo: The patient is from: Home              Anticipated d/c is to: Home with home health              Anticipated d/c date is: >3 days              Patient currently is not medically stable to d/c.     DVT prophylaxis: Enoxaparin   Code Status:   DNR  Family Communication: Son at bedside on PM rounds. Antimicrobials:   Ceftriaxone and azithromycin    Cefepime  Subjective: Oxygen needs decreased, had a better night, still shortwinded at rest and limited to just sitting up in bed due to dyspnea. No chest pain or leg swelling or palpitations. Eating well.   Objective: Vitals:   07/29/19 2231 07/30/19 0338 07/30/19 0800 07/30/19 1154  BP: (!) 113/54 118/60 130/78 (!) 110/59  Pulse:   90 88  Resp: (!) 23 (!) 25 (!) 22 (!) 25  Temp: 98 F (36.7 C) 98.1 F (36.7 C) 98 F (36.7 C) 97.7 F (36.5 C)  TempSrc: Oral Axillary Oral Axillary  SpO2: 90% (!) 88% (!) 89% 99%  Weight:      Height:        Intake/Output Summary (Last 24 hours) at 07/30/2019 1716 Last data filed at 07/30/2019 1535 Gross per 24 hour  Intake 106 ml  Output 575 ml  Net -469 ml   Filed Weights   07/31/2019 1930 07/25/19 0430 07/28/19 0500  Weight: 87.2 kg 84.6 kg 85.1 kg    Examination:  Gen: Pleasant, elderly male in no distress Pulm: Mildly labored tachypnea on 8L HFNC, crackles unchanged. No wheezes. CV: Regular rate and rhythm. No murmur, rub, or gallop. No JVD, no dependent edema. GI: Abdomen soft, non-tender, non-distended, with normoactive bowel sounds.  Ext: Warm, no deformities Skin: No rashes, lesions or ulcers on visualized skin. Neuro: Alert and oriented. No focal neurological deficits. Psych: Judgement and insight appear fair. Mood euthymic & affect congruent. Behavior is appropriate.    Data Reviewed: I have personally reviewed following labs and imaging studies  CBC: Recent Labs  Lab 07/25/19 0440 07/25/19 0440 07/26/19 0236 07/27/19 0339  07/28/19 0323 07/29/19 0436 07/30/19 0228  WBC 24.9*   < > 27.8* 22.3* 18.1* 18.9* 18.8*  NEUTROABS 21.4*  --  24.2* 19.7*  --   --   --   HGB 14.8   < > 13.9 12.7* 12.3* 13.8 11.6*  HCT 44.9   < > 42.1 37.8* 37.4* 40.2 34.4*  MCV 89.4   < > 88.4 88.7 87.6 87.0 86.4  PLT 143*   < > 141* 180 161 175 205   < > = values in this interval not displayed.   Basic Metabolic Panel: Recent Labs  Lab 07/26/19 0236 07/27/19 0339 07/28/19 0323 07/29/19 0436 07/30/19 0228  NA 141 135 133* 135 131*  K 3.7 4.6 3.5 3.8 4.0  CL 108 105 104 104 103  CO2 19* 20* 18* 16* 17*  GLUCOSE 82 255* 271* 199* 296*  BUN 39* 53* 61* 69* 80*  CREATININE 1.99* 2.23* 2.34* 2.51* 2.46*  CALCIUM 8.3* 8.0* 8.1* 8.4* 8.2*   Coagulation Profile: Recent Labs  Lab 07/23/2019 2241  INR 1.3*   CBG: Recent Labs  Lab 07/29/19 1645 07/29/19 2046 07/30/19 0814 07/30/19 1150 07/30/19 1631  GLUCAP 258* 215* 249* 393* 444*   Scheduled Meds:  acetaminophen  650 mg Oral Q6H   amLODipine  5 mg Oral Daily   aspirin EC  81 mg Oral Daily   atorvastatin  40 mg Oral Daily   heparin  5,000 Units Subcutaneous Q8H   insulin aspart  0-20 Units Subcutaneous TID WC   insulin aspart  0-5 Units Subcutaneous QHS   [START ON 07/31/2019] insulin aspart  5 Units Subcutaneous TID WC   insulin detemir  20 Units Subcutaneous BID   isosorbide mononitrate  30 mg Oral Daily   mouth rinse  15 mL Mouth Rinse BID   methylPREDNISolone (SOLU-MEDROL) injection  40 mg Intravenous Q8H   metoprolol tartrate  12.5 mg Oral Daily   mometasone-formoterol  2 puff Inhalation BID   pantoprazole  40 mg Oral Daily   sodium chloride flush  3 mL Intravenous Q12H   Continuous Infusions:  sodium chloride     ceFEPime (MAXIPIME) IV 2 g (07/30/19 1006)     LOS: 5 days    Time spent: 35 minutes  Patrecia Pour, MD

## 2019-07-31 ENCOUNTER — Encounter (HOSPITAL_COMMUNITY): Payer: Self-pay | Admitting: Family Medicine

## 2019-07-31 DIAGNOSIS — E663 Overweight: Secondary | ICD-10-CM | POA: Diagnosis present

## 2019-07-31 DIAGNOSIS — J84112 Idiopathic pulmonary fibrosis: Secondary | ICD-10-CM | POA: Diagnosis present

## 2019-07-31 DIAGNOSIS — Z66 Do not resuscitate: Secondary | ICD-10-CM | POA: Diagnosis present

## 2019-07-31 DIAGNOSIS — Z87891 Personal history of nicotine dependence: Secondary | ICD-10-CM

## 2019-07-31 DIAGNOSIS — I2510 Atherosclerotic heart disease of native coronary artery without angina pectoris: Secondary | ICD-10-CM | POA: Diagnosis present

## 2019-07-31 DIAGNOSIS — T380X5A Adverse effect of glucocorticoids and synthetic analogues, initial encounter: Secondary | ICD-10-CM | POA: Diagnosis not present

## 2019-07-31 DIAGNOSIS — J439 Emphysema, unspecified: Secondary | ICD-10-CM | POA: Diagnosis present

## 2019-07-31 LAB — BASIC METABOLIC PANEL
Anion gap: 12 (ref 5–15)
BUN: 103 mg/dL — ABNORMAL HIGH (ref 8–23)
CO2: 17 mmol/L — ABNORMAL LOW (ref 22–32)
Calcium: 8.4 mg/dL — ABNORMAL LOW (ref 8.9–10.3)
Chloride: 104 mmol/L (ref 98–111)
Creatinine, Ser: 2.88 mg/dL — ABNORMAL HIGH (ref 0.61–1.24)
GFR calc Af Amer: 22 mL/min — ABNORMAL LOW (ref >60)
GFR calc non Af Amer: 19 mL/min — ABNORMAL LOW (ref >60)
Glucose, Bld: 313 mg/dL — ABNORMAL HIGH (ref 70–99)
Potassium: 3.7 mmol/L (ref 3.5–5.1)
Sodium: 133 mmol/L — ABNORMAL LOW (ref 135–145)

## 2019-07-31 LAB — GLUCOSE, CAPILLARY
Glucose-Capillary: 267 mg/dL — ABNORMAL HIGH (ref 70–99)
Glucose-Capillary: 225 mg/dL — ABNORMAL HIGH (ref 70–99)
Glucose-Capillary: 131 mg/dL — ABNORMAL HIGH (ref 70–99)
Glucose-Capillary: 130 mg/dL — ABNORMAL HIGH (ref 70–99)

## 2019-07-31 LAB — CBC
HCT: 33.4 % — ABNORMAL LOW (ref 39.0–52.0)
Hemoglobin: 11.1 g/dL — ABNORMAL LOW (ref 13.0–17.0)
MCH: 28.8 pg (ref 26.0–34.0)
MCHC: 33.2 g/dL (ref 30.0–36.0)
MCV: 86.5 fL (ref 80.0–100.0)
Platelets: 279 10*3/uL (ref 150–400)
RBC: 3.86 MIL/uL — ABNORMAL LOW (ref 4.22–5.81)
RDW: 14.7 % (ref 11.5–15.5)
WBC: 23.3 10*3/uL — ABNORMAL HIGH (ref 4.0–10.5)
nRBC: 0 % (ref 0.0–0.2)

## 2019-07-31 LAB — EXPECTORATED SPUTUM ASSESSMENT W GRAM STAIN, RFLX TO RESP C

## 2019-07-31 MED ORDER — METHYLPREDNISOLONE SODIUM SUCC 125 MG IJ SOLR
60.0000 mg | Freq: Four times a day (QID) | INTRAMUSCULAR | Status: DC
  Administered 2019-07-31 – 2019-08-03 (×11): 60 mg via INTRAVENOUS
  Filled 2019-07-31 (×11): qty 2

## 2019-07-31 MED ORDER — FUROSEMIDE 10 MG/ML IJ SOLN
40.0000 mg | Freq: Every day | INTRAMUSCULAR | Status: DC
  Administered 2019-07-31: 40 mg via INTRAVENOUS
  Filled 2019-07-31: qty 4

## 2019-07-31 MED ORDER — IPRATROPIUM-ALBUTEROL 0.5-2.5 (3) MG/3ML IN SOLN
3.0000 mL | Freq: Four times a day (QID) | RESPIRATORY_TRACT | Status: DC
  Filled 2019-07-31: qty 3

## 2019-07-31 MED ORDER — ALBUTEROL SULFATE (2.5 MG/3ML) 0.083% IN NEBU: 2.5000 mg | INHALATION_SOLUTION | RESPIRATORY_TRACT | Status: DC | PRN

## 2019-07-31 MED ORDER — BUDESONIDE 0.5 MG/2ML IN SUSP
0.5000 mg | Freq: Two times a day (BID) | RESPIRATORY_TRACT | Status: DC
  Administered 2019-08-01 – 2019-08-03 (×5): 0.5 mg via RESPIRATORY_TRACT
  Filled 2019-07-31 (×6): qty 2

## 2019-07-31 MED ORDER — HYDROMORPHONE HCL 1 MG/ML IJ SOLN
0.2500 mg | INTRAMUSCULAR | Status: DC | PRN
  Administered 2019-07-31 – 2019-08-03 (×8): 0.25 mg via INTRAVENOUS
  Filled 2019-07-31 (×9): qty 1

## 2019-07-31 NOTE — Progress Notes (Signed)
RT called to room to assess patient for O2 desaturation. Patient placed on NRB by RN. Patient states he really doesn't want to wear the BiPAP if he doesn't have to. He thinks he will be ok on the NRB once he gets himself calmed down. Patient O2 sats 88%. RT will continue to monitor.

## 2019-07-31 NOTE — Progress Notes (Signed)
PROGRESS NOTE    Erik Logan  EGB:[id number] DOB: February 12, 1929 DOA: 08/03/2019 PCP: Neale Burly, MD   Brief Narrative:  Patient was admitted to the hospital with a working diagnosis of acute hypoxic respiratory failure due to multifocal community-acquired pneumonia (present on admission).  86 year old male with significant past medical history for hypertension, type 2 diabetes mellitus, coronary artery disease, chronic diastolic heart failure, and chronic kidney disease stage IV.  Patient reported worsening dyspnea for several weeks, at one point it was associated with lower extremity edema that transiently improved with increased dose of furosemide.  Due to persistent cough he received a short course of prednisone, with no significant improvement of his symptoms, he developed pleuritic chest pain mainly on the right side.  On his initial physical examination his oximetry was 80% on room air, blood pressure was 130/66, heart rate 75 and respiratory 23, his lungs had no significant wheezing or rales, heart S1-S2 present and rhythmic, tachycardic, abdomen soft, no lower extremity edema. Sodium 134, potassium 4.9, chloride 1 1, bicarb 21, glucose 417, BUN 34, creatinine 1.5.  Troponin I 105.  4.3, hemoglobin 15.4, hematocrit 46.5, platelets 146.  SARS Covid 19 negative.  Chest radiograph with multifocal interstitial infiltrates, all 4 quadrants.  EKG 81 bpm, left axis deviation, left anterior fascicular block, first degree AV block, sinus rhythm, no ST segment or T wave changes.   Patient developed respiratory distress and required non invasive mechanical ventilation and furosemide which modestly improved symptoms, though hypoxemia progressed. Pulmonology was consulted and antibiotics were broadened empirically. HRCT was performed revealing emphysema, ILD with probable UIP pattern and +RF. Steroids were started, though he has continued to require NIPPV and increasing flow with HFNC. DNR is confirmed  and, though goal of care is still curative, dilaudid is being used for dyspnea to assist with management.   Assessment & Plan:   Principal Problem:   Acute respiratory failure with hypoxia (HCC) Active Problems:   Essential (primary) hypertension   Hyperlipidemia   Type 2 diabetes mellitus without complication, with long-term current use of insulin (HCC)   CKD (chronic kidney disease) stage 4, GFR 15-29 ml/min (HCC)   Multifocal pneumonia   Acute on chronic diastolic CHF (congestive heart failure) (HCC)   Coronary artery disease   DNR (do not resuscitate)   UIP (usual interstitial pneumonitis) (HCC)   Emphysema of lung ([place])   History of tobacco abuse   Overweight   Steroid-induced hyperglycemia  Acute hypoxic respiratory failure due to multifocal community acquired pneumonia, UIP with flare and emphysema: HRCT suggestive of UIP.  - Started steroids 5/14, increase today though already at high dose. Hypoxemia worsened.  - Continue cefepime. MRSA PCR negative - Sputum Cx reincubating. - Continue supplemental oxygen, goal is SpO2 of 88% or higher and improved respiratory effort. This is growing more difficult, specifically remaining in low 80%'s with any exertion and ongoing elevated work of breathing. This was discussed at length by pulmonology with the family and patient and later by myself. We've confirmed that were respiratory distress to continue and oxygenation not adequate, we would shift to comfort measures only.  - Cultures NGTD   Emphysema, COPD: Diagnosed at previous hospitalization, has extensive tobacco use history. No current wheezing. - Continue bronchodilators. Steroids as above.  Acute on chronic diastolic heart failure. Echocardiogram from 2019 with preserved LV systolic function 55 to 37%. Updated echocardiogram during this admission similar to prior w/G1DD, no new WMA's noted. IVC suggestive of normal RA pressures.  -  Not currently peripherally overloaded. AKI  stable. - Continue metoprolol, isosorbide as below.  AKI on CKD stage IV with non gap metabolic acidosis: - Monitoring daily, we will restart diuretic given severity of respiratory failure. - BUN elevated more than would be expected, likely from steroids. Cr up. Also got vancomycin x1 which is stopped. - Avoid hypotension and nephrotoxic medications.   IDT2DM with hypoglycemia: - Made upward adjustments of insulin this morning in response to steroid-induced hyperglycemia. Severely elevated, increasing further today. - Continue SSI.  Dyslipidemia:  - Continue statin  CAD s/p PCI: No clinical signs of acute coronary syndrome, chest pain seems to be more pleuritic. Troponin I elevation in the setting of decompensated diastolic heart failure is due to demand ischemia. No ischemic changes on ECG. - No inpatient work up planned.  - Continue home medications inclusive of isosorbide, beta blocker, statin.   GERD:  - Continue PPI  Status is: Inpatient Remains inpatient appropriate because:IV treatments appropriate due to intensity of illness or inability to take PO Continues to have respiratory distress.   Dispo: The patient is from: Home              Anticipated d/c is to: TBD based on clinical trajectory. Will consult palliative care.              Anticipated d/c date is: >3 days              Patient currently is not medically stable to d/c.     DVT prophylaxis: Enoxaparin   Code Status:   DNR  Family Communication: Wife at bedside, will check back this PM for family meeting with son(s). Antimicrobials:   Ceftriaxone and azithromycin    Cefepime  Subjective: Respiratory distress this morning, getting in cycles with hypoxia worsened by any little thing, a cough, moving in bed, attempting to urinate in urinal in bed. Not able to sit up without respiratory distress. This is improved on BiPAP and was later improved with dilaudid.  Objective: Vitals:   07/31/19 0746 07/31/19 0943  07/31/19 1034 07/31/19 1235  BP:  (!) 192/81    Pulse: 89 89 85   Resp: 18     Temp:    (!) 97 F (36.1 C)  TempSrc:    Oral  SpO2: 92%  92%   Weight:      Height:        Intake/Output Summary (Last 24 hours) at 07/31/2019 1602 Last data filed at 07/31/2019 1120 Gross per 24 hour  Intake 3 ml  Output 550 ml  Net -547 ml   Filed Weights   07/25/19 0430 07/28/19 0500 07/31/19 0425  Weight: 84.6 kg 85.1 kg 87.3 kg    Examination:  Gen: Elderly male in mild respiratory distress Pulm: Elevated WOB, tachypnea, pursed lip breathing with NRB on, crackles bilaterally. CV: Regular rate and rhythm. No murmur, rub, or gallop. No JVD, no dependent edema. GI: Abdomen soft, non-tender, non-distended, with normoactive bowel sounds.  Ext: Warm, no deformities Skin: No rashes, lesions or ulcers on visualized skin. Neuro: Alert and oriented. No focal neurological deficits. Psych: Judgement and insight appear fair. Mood appropriately anxious & affect congruent.    Data Reviewed: I have personally reviewed following labs and imaging studies  CBC: Recent Labs  Lab 07/25/19 0440 07/25/19 0440 07/26/19 0236 07/26/19 0236 07/27/19 0339 07/28/19 0323 07/29/19 0436 07/30/19 0228 07/31/19 0239  WBC 24.9*   < > 27.8*   < > 22.3* 18.1* 18.9* 18.8* 23.3*  NEUTROABS 21.4*  --  24.2*  --  19.7*  --   --   --   --   HGB 14.8   < > 13.9   < > 12.7* 12.3* 13.8 11.6* 11.1*  HCT 44.9   < > 42.1   < > 37.8* 37.4* 40.2 34.4* 33.4*  MCV 89.4   < > 88.4   < > 88.7 87.6 87.0 86.4 86.5  PLT 143*   < > 141*   < > 180 161 175 205 279   < > = values in this interval not displayed.   Basic Metabolic Panel: Recent Labs  Lab 07/27/19 0339 07/28/19 0323 07/29/19 0436 07/30/19 0228 07/31/19 0239  NA 135 133* 135 131* 133*  K 4.6 3.5 3.8 4.0 3.7  CL 105 104 104 103 104  CO2 20* 18* 16* 17* 17*  GLUCOSE 255* 271* 199* 296* 313*  BUN 53* 61* 69* 80* 103*  CREATININE 2.23* 2.34* 2.51* 2.46* 2.88*    CALCIUM 8.0* 8.1* 8.4* 8.2* 8.4*   Coagulation Profile: Recent Labs  Lab 08/05/2019 2241  INR 1.3*   CBG: Recent Labs  Lab 07/30/19 1150 07/30/19 1631 07/30/19 2108 07/31/19 0724 07/31/19 1237  GLUCAP 393* 444* 379* 267* 225*   Scheduled Meds: . acetaminophen  650 mg Oral Q6H  . amLODipine  5 mg Oral Daily  . aspirin EC  81 mg Oral Daily  . atorvastatin  40 mg Oral Daily  . budesonide (PULMICORT) nebulizer solution  0.5 mg Nebulization BID  . heparin  5,000 Units Subcutaneous Q8H  . insulin aspart  0-20 Units Subcutaneous TID WC  . insulin aspart  0-5 Units Subcutaneous QHS  . insulin aspart  5 Units Subcutaneous TID WC  . insulin detemir  20 Units Subcutaneous BID  . ipratropium-albuterol  3 mL Nebulization Q6H  . isosorbide mononitrate  30 mg Oral Daily  . mouth rinse  15 mL Mouth Rinse BID  . methylPREDNISolone (SOLU-MEDROL) injection  60 mg Intravenous Q6H  . metoprolol tartrate  12.5 mg Oral Daily  . pantoprazole  40 mg Oral Daily  . sodium chloride flush  3 mL Intravenous Q12H   Continuous Infusions: . sodium chloride    . ceFEPime (MAXIPIME) IV 2 g (07/31/19 0952)     LOS: 6 days    Time spent: 35 minutes  Patrecia Pour, MD

## 2019-07-31 NOTE — Progress Notes (Signed)
This RN was called to patients room due to patient having low O2 saturations. Patient was placed on non-rebreather, Rapid RN was called, Dr. Bonner Puna was paged and RT came to bedside. Patient's O2 sats improved to 88% on 15L non-rebreather.

## 2019-07-31 NOTE — Progress Notes (Signed)
NAME:  Erik Logan, MRN:  [id number], DOB:  October 27, 1928, LOS: 6 ADMISSION DATE:  07/23/2019, CONSULTATION DATE: 07/30/2019 REFERRING MD: Dr. Bonner Puna, [facility], CHIEF COMPLAINT:  Short of breath   Brief History   86 yo former smoker with Lt sided chest pain and dyspnea from acute hypoxic respiratory failure from pneumonia in setting of emphysema and ILD with UIP pattern.  Past Medical History  DM type 2, CAD, HTN, HLD, PUD, CKD 2  Significant Hospital Events   5/09 Admit 5/10 needing Bipap  Consults:    Procedures:    Significant Diagnostic Tests:  Echo 5/11 >> EF 55 to 60%, grade 1 DD HRCT chest 5/14 >> atherosclerosis, severe centrilobular emphysema, bronchial wall thickening, moderate patchy consolidation with GGO b/l, patchy reticulation, mild traction BTX, mild architectural distortion, mild honeycombing, cluster of nodularity in LLL  Micro Data:  SARS CoV2 PCR 5/09 >> negative Influenza PCR 5/09 >> negative Blood 5/09 >> negative Sputum 5/14 >>   Antimicrobials:  Vancomycin 5/09 >> 5/14 Zithromax 5/09 >> 5/13 Cefepime 5/14 >>   Interim history/subjective:  Needing Bipap more overnight and through the day.  Objective   Blood pressure (!) 192/81, pulse 85, temperature (!) 97 F (36.1 C), temperature source Oral, resp. rate 18, height 5\' 8"  (1.727 m), weight 87.3 kg, SpO2 92 %.        Intake/Output Summary (Last 24 hours) at 07/31/2019 1503 Last data filed at 07/31/2019 1120 Gross per 24 hour  Intake 3 ml  Output 850 ml  Net -847 ml   Filed Weights   07/25/19 0430 07/28/19 0500 07/31/19 0425  Weight: 84.6 kg 85.1 kg 87.3 kg    Examination:  General - alert Eyes - pupils reactive ENT - no sinus tenderness, no stridor Cardiac - regular rate/rhythm, no murmur Chest - b/l crackles Abdomen - soft, non tender, + bowel sounds Extremities - no cyanosis, clubbing, or edema Skin - no rashes Neuro - normal strength, moves extremities, follows commands Psych -  normal mood and behavior   Resolved Hospital Problem list     Assessment & Plan:   Acute hypoxic respiratory failure. - from PNA in setting of emphysema and ILD - goal SpO2 88 to 95% - Bipap qhs and prn  Pneumonia. - day 3 of cefepime - f/u portable CXR 5/17  Emphysema with presumed COPD. - change to pulmicort, duoneb, and prn albuterol  ILD with probable UIP pattern and positive rheumatoid factor. - change solumedrol to 60 mg q6h - f/u CCP, ANA, EN Ab from 5/14  Goals of care. - DNR/DNI - had lengthy d/w pt's wife and son - explained he has severe pneumonia on background of emphysema and ILD.  He is on maximal therapy.  If he doesn't show improvement, then raised the possibility that we might need to consider transitioning to comfort measures  Best practice:  Diet: carb modified, heart healthy DVT prophylaxis: SQ heparin GI prophylaxis: protonix Mobility: bed rest Code Status: DNR/DNI Disposition: progressive care  Labs    CMP Latest Ref Rng & Units 07/31/2019 07/30/2019 07/29/2019  Glucose 70 - 99 mg/dL 313(H) 296(H) 199(H)  BUN 8 - 23 mg/dL 103(H) 80(H) 69(H)  Creatinine 0.61 - 1.24 mg/dL 2.88(H) 2.46(H) 2.51(H)  Sodium 135 - 145 mmol/L 133(L) 131(L) 135  Potassium 3.5 - 5.1 mmol/L 3.7 4.0 3.8  Chloride 98 - 111 mmol/L 104 103 104  CO2 22 - 32 mmol/L 17(L) 17(L) 16(L)  Calcium 8.9 - 10.3 mg/dL 8.4(L) 8.2(L) 8.4(L)  CBC Latest Ref Rng & Units 07/31/2019 07/30/2019 07/29/2019  WBC 4.0 - 10.5 K/uL 23.3(H) 18.8(H) 18.9(H)  Hemoglobin 13.0 - 17.0 g/dL 11.1(L) 11.6(L) 13.8  Hematocrit 39.0 - 52.0 % 33.4(L) 34.4(L) 40.2  Platelets 150 - 400 K/uL 279 205 175    CBG (last 3)  Recent Labs    07/30/19 2108 07/31/19 0724 07/31/19 1237  GLUCAP 379* 267* 225*    Signature:  Chesley Mires, MD [place] Pager - [phone] 07/31/2019, 3:15 PM

## 2019-07-31 NOTE — Significant Event (Signed)
Rapid Response Event Note   Overview: Respiratory - Follow Up  Initial Focused Assessment: Received a call from [place], informed me that the patient is needing more oxygen and that he might need to go back on BIPAP, I asked that if they place the patient on BIPAP and that I would come see as soon I could. When I came, patient was just placed on BIPAP 80% 12/6 - at first his RR was 38-42, increased SOB and increased WOB with saturation of 89%. Lung sounds - clear throughout   I saw this patient two days again, so I was able to talk to him and coach him with slowing his breathing down, after a few minutes on the BIPAP, his RR was 24, no longer working hard to breath, saturations improved to 98% on BIPAP, his BP is better now - 138/70 (90) with HR of 77. He denies chest pain and states he feels better, he fell asleep.  Interventions: -- No RRT Interventions, placed on BIPAP by 2W staff  Plan of Care: -- Wean BIPAP as patient tolerates -- Rest per medical team  Event Summary:  Start Time 1115 End Time 1135  Andrez Lieurance R

## 2019-08-01 ENCOUNTER — Inpatient Hospital Stay (HOSPITAL_COMMUNITY): Payer: Medicare Other

## 2019-08-01 LAB — CBC
HCT: 34.3 % — ABNORMAL LOW (ref 39.0–52.0)
Hemoglobin: 11.6 g/dL — ABNORMAL LOW (ref 13.0–17.0)
MCH: 29.4 pg (ref 26.0–34.0)
MCHC: 33.8 g/dL (ref 30.0–36.0)
MCV: 86.8 fL (ref 80.0–100.0)
Platelets: 301 10*3/uL (ref 150–400)
RBC: 3.95 MIL/uL — ABNORMAL LOW (ref 4.22–5.81)
RDW: 15 % (ref 11.5–15.5)
WBC: 23.6 10*3/uL — ABNORMAL HIGH (ref 4.0–10.5)
nRBC: 0 % (ref 0.0–0.2)

## 2019-08-01 LAB — BASIC METABOLIC PANEL
Anion gap: 10 (ref 5–15)
BUN: 114 mg/dL — ABNORMAL HIGH (ref 8–23)
CO2: 19 mmol/L — ABNORMAL LOW (ref 22–32)
Calcium: 8.5 mg/dL — ABNORMAL LOW (ref 8.9–10.3)
Chloride: 108 mmol/L (ref 98–111)
Creatinine, Ser: 2.97 mg/dL — ABNORMAL HIGH (ref 0.61–1.24)
GFR calc Af Amer: 21 mL/min — ABNORMAL LOW (ref >60)
GFR calc non Af Amer: 18 mL/min — ABNORMAL LOW (ref >60)
Glucose, Bld: 159 mg/dL — ABNORMAL HIGH (ref 70–99)
Potassium: 5.1 mmol/L (ref 3.5–5.1)
Sodium: 137 mmol/L (ref 135–145)

## 2019-08-01 LAB — CULTURE, RESPIRATORY W GRAM STAIN: Culture: NORMAL

## 2019-08-01 LAB — GLUCOSE, CAPILLARY
Glucose-Capillary: 140 mg/dL — ABNORMAL HIGH (ref 70–99)
Glucose-Capillary: 154 mg/dL — ABNORMAL HIGH (ref 70–99)
Glucose-Capillary: 154 mg/dL — ABNORMAL HIGH (ref 70–99)
Glucose-Capillary: 195 mg/dL — ABNORMAL HIGH (ref 70–99)

## 2019-08-01 LAB — CYCLIC CITRUL PEPTIDE ANTIBODY, IGG/IGA: CCP Antibodies IgG/IgA: 4 units (ref 0–19)

## 2019-08-01 MED ORDER — IPRATROPIUM-ALBUTEROL 0.5-2.5 (3) MG/3ML IN SOLN
3.0000 mL | Freq: Two times a day (BID) | RESPIRATORY_TRACT | Status: DC
  Administered 2019-08-01 – 2019-08-03 (×4): 3 mL via RESPIRATORY_TRACT
  Filled 2019-08-01 (×4): qty 3

## 2019-08-01 MED ORDER — BIOTENE DRY MOUTH MT LIQD: 15.0000 mL | OROMUCOSAL | Status: DC | PRN

## 2019-08-01 MED ORDER — LORAZEPAM 2 MG/ML IJ SOLN
0.5000 mg | Freq: Four times a day (QID) | INTRAMUSCULAR | Status: DC | PRN
  Administered 2019-08-02 – 2019-08-03 (×3): 0.5 mg via INTRAVENOUS
  Filled 2019-08-01 (×3): qty 1

## 2019-08-01 MED ORDER — IPRATROPIUM-ALBUTEROL 0.5-2.5 (3) MG/3ML IN SOLN
3.0000 mL | Freq: Four times a day (QID) | RESPIRATORY_TRACT | Status: DC
  Administered 2019-08-01 (×2): 3 mL via RESPIRATORY_TRACT
  Filled 2019-08-01: qty 3

## 2019-08-01 MED ORDER — LIP MEDEX EX OINT
TOPICAL_OINTMENT | CUTANEOUS | Status: DC | PRN
  Filled 2019-08-01: qty 7

## 2019-08-01 MED ORDER — SODIUM CHLORIDE 0.9% FLUSH: 10.0000 mL | INTRAVENOUS | Status: DC | PRN

## 2019-08-01 NOTE — Progress Notes (Signed)
Patient is refusing the Bipap for tonight. He wants to continue using the NRB. His SpO2 is 94%. He was advised that he could use the NRB and we will keep the Bipap on standby for any respiratory distress that may occur.

## 2019-08-01 NOTE — Progress Notes (Signed)
PROGRESS NOTE    Erik Logan  PFX:[id number] DOB: 1928-11-05 DOA: 08/12/2019 PCP: Neale Burly, MD   Brief Narrative:  Patient was admitted to the hospital with a working diagnosis of acute hypoxic respiratory failure due to multifocal community-acquired pneumonia (present on admission).  86 year old male with significant past medical history for hypertension, type 2 diabetes mellitus, coronary artery disease, chronic diastolic heart failure, and chronic kidney disease stage IV.  Patient reported worsening dyspnea for several weeks, at one point it was associated with lower extremity edema that transiently improved with increased dose of furosemide.  Due to persistent cough he received a short course of prednisone, with no significant improvement of his symptoms, he developed pleuritic chest pain mainly on the right side.  On his initial physical examination his oximetry was 80% on room air, blood pressure was 130/66, heart rate 75 and respiratory 23, his lungs had no significant wheezing or rales, heart S1-S2 present and rhythmic, tachycardic, abdomen soft, no lower extremity edema. Sodium 134, potassium 4.9, chloride 1 1, bicarb 21, glucose 417, BUN 34, creatinine 1.5.  Troponin I 105.  4.3, hemoglobin 15.4, hematocrit 46.5, platelets 146.  SARS Covid 19 negative.  Chest radiograph with multifocal interstitial infiltrates, all 4 quadrants.  EKG 81 bpm, left axis deviation, left anterior fascicular block, first degree AV block, sinus rhythm, no ST segment or T wave changes.   Patient developed respiratory distress and required non invasive mechanical ventilation and furosemide which modestly improved symptoms, though hypoxemia progressed. Pulmonology was consulted and antibiotics were broadened empirically. HRCT was performed revealing emphysema, ILD with probable UIP pattern and +RF. Steroids were started, though he has continued to require NIPPV and increasing flow with HFNC. DNR is confirmed  and, though goal of care is still curative, dilaudid is being used for dyspnea to assist with management.   Assessment & Plan:   Principal Problem:   Acute respiratory failure with hypoxia (HCC) Active Problems:   Essential (primary) hypertension   Hyperlipidemia   Type 2 diabetes mellitus without complication, with long-term current use of insulin (HCC)   CKD (chronic kidney disease) stage 4, GFR 15-29 ml/min (HCC)   Multifocal pneumonia   Acute on chronic diastolic CHF (congestive heart failure) (HCC)   Coronary artery disease   DNR (do not resuscitate)   UIP (usual interstitial pneumonitis) (HCC)   Emphysema of lung ([place])   History of tobacco abuse   Overweight   Steroid-induced hyperglycemia  Acute hypoxic respiratory failure due to multifocal community acquired pneumonia, UIP with flare and emphysema: HRCT suggestive of UIP.  - Started steroids 5/14. - Continue cefepime. MRSA PCR negative. Personal review of CXR this AM shows improved consolidation on right and better aeration. Sputum Cx normal flora.  - Continue supplemental oxygen, goal is SpO2 of 88% or higher and improved respiratory effort. This was discussed at length by pulmonology with the family and patient and later by myself. We've confirmed that were respiratory distress to continue and oxygenation not adequate, we would shift to comfort measures only.  - Cultures NGTD   Emphysema, COPD: Diagnosed at previous hospitalization, has extensive tobacco use history. No current wheezing. - Continue bronchodilators. Steroids as above.  Acute on chronic diastolic heart failure. Echocardiogram from 2019 with preserved LV systolic function 55 to 32%. Updated echocardiogram during this admission similar to prior w/G1DD, no new WMA's noted. IVC suggestive of normal RA pressures.  - Given lasix last 5/16. - Not currently peripherally overloaded. AKI stable. - Continue  metoprolol, isosorbide as below.  AKI on CKD stage IV with  non gap metabolic acidosis: - Monitoring daily, restarted diuretic given severity of respiratory failure. Cr up further and not taking much fluids, will not repeat. - BUN elevated more than would be expected, likely from steroids. Cr up. Also got vancomycin x1 which is stopped. - Avoid hypotension and nephrotoxic medications.   IDT2DM with hypoglycemia: - Made upward adjustments of insulin, now improved also due to his inability to take po significantly due to respiratory distress. - Continue SSI.  Anxiety:  - Low dose ativan prn. Risks of respiratory depression and subsequent hypercarbia and death discussed with patient.  Dyslipidemia:  - Continue statin  CAD s/p PCI: No clinical signs of acute coronary syndrome, chest pain seems to be more pleuritic. Troponin I elevation in the setting of decompensated diastolic heart failure is due to demand ischemia. No ischemic changes on ECG. - No inpatient work up planned.  - Continue home medications inclusive of isosorbide, beta blocker, statin.   GERD:  - Continue PPI  Status is: Inpatient Remains inpatient appropriate because:IV treatments appropriate due to intensity of illness or inability to take PO Continues to have respiratory distress.   Dispo: The patient is from: Home              Anticipated d/c is to: TBD based on clinical trajectory. Will consult palliative care.              Anticipated d/c date is: >3 days              Patient currently is not medically stable to d/c.     DVT prophylaxis: Enoxaparin   Code Status:   DNR  Family Communication: Multiple family members daily. Given his precarious clinical status, this patient should have relaxed visitation privileges. Antimicrobials:   Ceftriaxone and azithromycin    Cefepime  Subjective: Feels a little stronger today, but still has trouble even drinking water before having dyspnea severely. Dilaudid helps.   Objective: Vitals:   08/01/19 0935 08/01/19 1000 08/01/19  1149 08/01/19 1217  BP:  (!) 109/94 123/65   Pulse: 85   79  Resp: 18 (!) 24 (!) 22 18  Temp:   98.8 F (37.1 C)   TempSrc:   Axillary   SpO2: 92% 97% 96% 97%  Weight:      Height:        Intake/Output Summary (Last 24 hours) at 08/01/2019 1424 Last data filed at 08/01/2019 0900 Gross per 24 hour  Intake 453 ml  Output 800 ml  Net -347 ml   Filed Weights   07/28/19 0500 07/31/19 0425 08/01/19 0417  Weight: 85.1 kg 87.3 kg 85.3 kg    Examination:  Gen: Elderly male in no distress Pulm: Moderately labored, crackles diffusely. CV: Regular rate and rhythm. No murmur, rub, or gallop. No JVD, no dependent edema. GI: Abdomen soft, non-tender, non-distended, with normoactive bowel sounds.  Ext: Warm, no deformities Skin: No new rashes, lesions or ulcers on visualized skin. Neuro: Alert and oriented. No focal neurological deficits. Psych: Judgement and insight appear fair. Mood euthymic & affect congruent. Behavior is appropriate.    Data Reviewed: I have personally reviewed following labs and imaging studies  CBC: Recent Labs  Lab 07/26/19 0236 07/26/19 0236 07/27/19 0339 07/27/19 0339 07/28/19 0323 07/29/19 0436 07/30/19 0228 07/31/19 0239 08/01/19 0212  WBC 27.8*   < > 22.3*   < > 18.1* 18.9* 18.8* 23.3* 23.6*  NEUTROABS 24.2*  --  19.7*  --   --   --   --   --   --   HGB 13.9   < > 12.7*   < > 12.3* 13.8 11.6* 11.1* 11.6*  HCT 42.1   < > 37.8*   < > 37.4* 40.2 34.4* 33.4* 34.3*  MCV 88.4   < > 88.7   < > 87.6 87.0 86.4 86.5 86.8  PLT 141*   < > 180   < > 161 175 205 279 301   < > = values in this interval not displayed.   Basic Metabolic Panel: Recent Labs  Lab 07/28/19 0323 07/29/19 0436 07/30/19 0228 07/31/19 0239 08/01/19 0212  NA 133* 135 131* 133* 137  K 3.5 3.8 4.0 3.7 5.1  CL 104 104 103 104 108  CO2 18* 16* 17* 17* 19*  GLUCOSE 271* 199* 296* 313* 159*  BUN 61* 69* 80* 103* 114*  CREATININE 2.34* 2.51* 2.46* 2.88* 2.97*  CALCIUM 8.1* 8.4* 8.2*  8.4* 8.5*   Coagulation Profile: No results for input(s): INR, PROTIME in the last 168 hours. CBG: Recent Labs  Lab 07/31/19 1237 07/31/19 1621 07/31/19 2109 08/01/19 0759 08/01/19 1150  GLUCAP 225* 131* 130* 140* 154*   Scheduled Meds: . acetaminophen  650 mg Oral Q6H  . amLODipine  5 mg Oral Daily  . aspirin EC  81 mg Oral Daily  . atorvastatin  40 mg Oral Daily  . budesonide (PULMICORT) nebulizer solution  0.5 mg Nebulization BID  . heparin  5,000 Units Subcutaneous Q8H  . insulin aspart  0-20 Units Subcutaneous TID WC  . insulin aspart  0-5 Units Subcutaneous QHS  . insulin aspart  5 Units Subcutaneous TID WC  . insulin detemir  20 Units Subcutaneous BID  . ipratropium-albuterol  3 mL Nebulization QID  . isosorbide mononitrate  30 mg Oral Daily  . mouth rinse  15 mL Mouth Rinse BID  . methylPREDNISolone (SOLU-MEDROL) injection  60 mg Intravenous Q6H  . metoprolol tartrate  12.5 mg Oral Daily  . pantoprazole  40 mg Oral Daily  . sodium chloride flush  3 mL Intravenous Q12H   Continuous Infusions: . sodium chloride    . ceFEPime (MAXIPIME) IV 2 g (08/01/19 0822)     LOS: 7 days    Time spent: 35 minutes  Patrecia Pour, MD

## 2019-08-01 NOTE — Progress Notes (Signed)
Patient is on 15L NRB mask. His SpO2 is 94%. His SpO2 dropped to 78% while taking his breathing treatment on 8L. The treatment was stopped and the NRB mask was placed back on the patient at 15L.

## 2019-08-01 NOTE — Plan of Care (Signed)

## 2019-08-01 NOTE — Progress Notes (Signed)
NAME:  Erik Logan, MRN:  [id number], DOB:  09/07/1928, LOS: 7 ADMISSION DATE:  07/26/2019, CONSULTATION DATE: 07/30/2019 REFERRING MD: Dr. Bonner Puna, [facility], CHIEF COMPLAINT:  Short of breath   Brief History   86 yo former smoker with Lt sided chest pain and dyspnea from acute hypoxic respiratory failure from pneumonia in setting of emphysema and ILD with UIP pattern.  Past Medical History  DM type 2, CAD, HTN, HLD, PUD, CKD 2  Significant Hospital Events   5/09 Admit 5/10 needing Bipap  Consults:    Procedures:    Significant Diagnostic Tests:  Echo 5/11 >> EF 55 to 60%, grade 1 DD HRCT chest 5/14 >> atherosclerosis, severe centrilobular emphysema, bronchial wall thickening, moderate patchy consolidation with GGO b/l, patchy reticulation, mild traction BTX, mild architectural distortion, mild honeycombing, cluster of nodularity in LLL  Micro Data:  SARS CoV2 PCR 5/09 >> negative Influenza PCR 5/09 >> negative Blood 5/09 >> negative Sputum 5/14 >>   Antimicrobials:  Vancomycin 5/09 >> 5/14 Zithromax 5/09 >> 5/13 Cefepime 5/14 >>   Interim history/subjective:   States feels better today, breathing is better Complains of dry mouth and poor appetite Wore BiPAP overnight, but today is doing well on NRB 15L ~95%  Objective   Blood pressure 123/65, pulse 79, temperature 98.8 F (37.1 C), temperature source Axillary, resp. rate 18, height 5\' 8"  (1.727 m), weight 85.3 kg, SpO2 97 %.        Intake/Output Summary (Last 24 hours) at 08/01/2019 1422 Last data filed at 08/01/2019 0900 Gross per 24 hour  Intake 453 ml  Output 800 ml  Net -347 ml   Filed Weights   07/28/19 0500 07/31/19 0425 08/01/19 0417  Weight: 85.1 kg 87.3 kg 85.3 kg    Examination: General:  Elderly male lying in bed in NAD, wife at bedside HEENT: MM pink/dry, chapped lips Neuro: alert, oriented x3, MAE CV: rr PULM:  Non labored, mildly tachypneic, clear anteriorly, bibasilar rales, speaking full  sentences on NRB GI: soft, bs active  Extremities: warm/dry, no LE edema   Resolved Hospital Problem list     Assessment & Plan:   Acute hypoxic respiratory failure. - from PNA in setting of emphysema and ILD - goal SpO2 88 to 95% - Bipap qhs and prn - will try salter HFNC and biotene mouth wash today to help with oral dryness and lip balm  Pneumonia. - day 4 of cefepime - f/u CXR 5/17 with some interval improvement  Emphysema with presumed COPD. - continue to pulmicort, duoneb, and prn albuterol  ILD with probable UIP pattern and positive rheumatoid factor. - continue solumedrol to 60 mg q6h - f/u CCP neg, ANA pending, EN Ab pending from 5/14  Goals of care. - DNR/DNI - lengthy d/w pt's wife and son 5/16 given severe pneumonia on background of emphysema and ILD.  He is on maximal therapy.  If he worsens, consider transition to comfort care.    Best practice:  Diet: carb modified, heart healthy DVT prophylaxis: SQ heparin GI prophylaxis: protonix Mobility: bed rest Code Status: DNR/DNI Disposition: progressive care  Labs    CMP Latest Ref Rng & Units 08/01/2019 07/31/2019 07/30/2019  Glucose 70 - 99 mg/dL 159(H) 313(H) 296(H)  BUN 8 - 23 mg/dL 114(H) 103(H) 80(H)  Creatinine 0.61 - 1.24 mg/dL 2.97(H) 2.88(H) 2.46(H)  Sodium 135 - 145 mmol/L 137 133(L) 131(L)  Potassium 3.5 - 5.1 mmol/L 5.1 3.7 4.0  Chloride 98 - 111 mmol/L 108  104 103  CO2 22 - 32 mmol/L 19(L) 17(L) 17(L)  Calcium 8.9 - 10.3 mg/dL 8.5(L) 8.4(L) 8.2(L)    CBC Latest Ref Rng & Units 08/01/2019 07/31/2019 07/30/2019  WBC 4.0 - 10.5 K/uL 23.6(H) 23.3(H) 18.8(H)  Hemoglobin 13.0 - 17.0 g/dL 11.6(L) 11.1(L) 11.6(L)  Hematocrit 39.0 - 52.0 % 34.3(L) 33.4(L) 34.4(L)  Platelets 150 - 400 K/uL 301 279 205    CBG (last 3)  Recent Labs    07/31/19 2109 08/01/19 0759 08/01/19 1150  GLUCAP 130* 140* 154*    Signature:   Kennieth Rad, MSN, AGACNP-BC [facility] 08/01/2019, 2:22  PM  See Amion for personal pager PCCM on call pager [phone]

## 2019-08-01 NOTE — Progress Notes (Signed)
Pharmacy Antibiotic Note  Erik Logan is a 86 y.o. male admitted on 07/16/2019 with PNA  Day #4 of Cefepime therapy. Cultures remain negative.  WBC is increased at 23.6 - on steroid therapy and dose was increased 5/16 contributing to this. Patient is afebrile. CXR 5/17 showing improved bilateral pneumonia.  AKI - SCr increased at 2.97 with estimated CrCl ~18 mL/min.   Plan: Continue Cefepime 2 g q24h Monitor renal function, clinical status, and culture results.   Height: 5\' 8"  (172.7 cm) Weight: 85.3 kg (188 lb 0.8 oz) IBW/kg (Calculated) : 68.4  Temp (24hrs), Avg:98 F (36.7 C), Min:97.6 F (36.4 C), Max:98.8 F (37.1 C)  Recent Labs  Lab 07/28/19 0323 07/29/19 0436 07/30/19 0228 07/31/19 0239 08/01/19 0212  WBC 18.1* 18.9* 18.8* 23.3* 23.6*  CREATININE 2.34* 2.51* 2.46* 2.88* 2.97*    Estimated Creatinine Clearance: 18.3 mL/min (A) (by C-G formula based on SCr of 2.97 mg/dL (H)).    Allergies  Allergen Reactions  . Beef-Derived Products Hives    Red meat  . Sulfa Antibiotics Swelling   Sloan Leiter, PharmD, BCPS, BCCCP Clinical Pharmacist Please refer to [facility] for [place] numbers 08/01/2019 2:03 PM

## 2019-08-01 NOTE — Progress Notes (Signed)
Physical Therapy Treatment Patient Details Name: Erik Logan MRN: [id number] DOB: 09-15-1928 Today's Date: 08/01/2019    History of Present Illness Patient is a 86 y/o male who presents with worsening SOB and chest pain. EKG- sinus rhythm with 1st degree AV node block. CXR 5/9- atelectasis right lung base. CXR- 5/10-bilteral infiltrates. Admitted with acute hypoxic respiratory failure secondry to PNA. On 5/10, pt requiring BiPAP due to CP and respiratory distress. On 5/13 pt with pulmonary consult and to have chest CT to evaluate for IPF.  PMH includes DM, HTN, NSTEMI, CAD, HLD, SCC.    PT Comments    Pt with increased O2 need today - upon arrival pt on non-rebreather mask.  He was willing to work with therapy and sit EOB.  Pt able to sit EOB for 20 minutes with monitoring of vitals and performed some exercises.  Pt needed frequent cues for relaxation and breathing technique.  Progress as able.     Follow Up Recommendations  Supervision/Assistance - 24 hour;Other (comment);Home health PT     Equipment Recommendations  None recommended by PT    Recommendations for Other Services       Precautions / Restrictions Precautions Precautions: Fall Precaution Comments: watch 02    Mobility  Bed Mobility Overal bed mobility: Needs Assistance Bed Mobility: Supine to Sit;Sit to Supine     Supine to sit: Min assist Sit to supine: Min assist   General bed mobility comments: Min A to boost trunk and to get feet back in bed.  Required cues for relaxation and letting therapist help as needed due to O2 needs  Transfers                 General transfer comment: declined/deferred due to O2 needs  Ambulation/Gait                 Stairs             Wheelchair Mobility    Modified Rankin (Stroke Patients Only)       Balance Overall balance assessment: Needs assistance Sitting-balance support: Feet supported;No upper extremity supported Sitting balance-Leahy  Scale: Good         Standing balance comment: deferred                            Cognition Arousal/Alertness: Awake/alert Behavior During Therapy: WFL for tasks assessed/performed Overall Cognitive Status: Within Functional Limits for tasks assessed                                 General Comments: Pt reports he feels easily agitated/aggravated but he was pleasant during therapy      Exercises General Exercises - Lower Extremity Ankle Circles/Pumps: AROM;Both;10 reps;Seated Long Arc Quad: AROM;Both;10 reps;Seated Hip Flexion/Marching: AROM;Both;10 reps;Seated    General Comments General comments (skin integrity, edema, etc.): Pt on non-rebreather mask at 15 L.  His sats were 95% at rest, dropped to 88% when moved to EOB but up to 93% in < 1 minute with cues for relaxation.  Maintained 90% or > at EOB.  When returned to supine pt with increased resp rate, trying to pull his gown out from his back, etc and needing cues for relaxation.  During that time O2 down to 83% and took 2 minutes to recover to 90% with cues for relaxation, breathing technique, and to let therapist assist.  Pertinent Vitals/Pain Pain Assessment: No/denies pain    Home Living                      Prior Function            PT Goals (current goals can now be found in the care plan section) Acute Rehab PT Goals Patient Stated Goal: to go home PT Goal Formulation: With patient Time For Goal Achievement: 08/09/19 Potential to Achieve Goals: Fair Progress towards PT goals: Not progressing toward goals - comment(increased O2 need today but pt motivated)    Frequency    Min 3X/week      PT Plan Current plan remains appropriate    Co-evaluation              AM-PAC PT "6 Clicks" Mobility   Outcome Measure  Help needed turning from your back to your side while in a flat bed without using bedrails?: A Little Help needed moving from lying on your back to  sitting on the side of a flat bed without using bedrails?: A Little Help needed moving to and from a bed to a chair (including a wheelchair)?: A Little Help needed standing up from a chair using your arms (e.g., wheelchair or bedside chair)?: A Little Help needed to walk in hospital room?: A Little Help needed climbing 3-5 steps with a railing? : A Little 6 Click Score: 18    End of Session Equipment Utilized During Treatment: Oxygen Activity Tolerance: Patient limited by fatigue Patient left: with call bell/phone within reach;with family/visitor present;in bed;with bed alarm set Nurse Communication: Mobility status PT Visit Diagnosis: Difficulty in walking, not elsewhere classified (R26.2)     Time: 1510-1540 PT Time Calculation (min) (ACUTE ONLY): 30 min  Charges:  $Therapeutic Activity: 23-37 mins                     Maggie Font, PT Acute Rehab Services Pager [phone] [facility] [phone] [facility] [place] 08/01/2019, 5:06 PM

## 2019-08-01 NOTE — Progress Notes (Signed)
Pt on nrb @ 15lts. SATS 93

## 2019-08-02 ENCOUNTER — Ambulatory Visit: Payer: Medicare Other | Admitting: Family Medicine

## 2019-08-02 DIAGNOSIS — Z66 Do not resuscitate: Secondary | ICD-10-CM

## 2019-08-02 DIAGNOSIS — Z515 Encounter for palliative care: Secondary | ICD-10-CM

## 2019-08-02 DIAGNOSIS — N184 Chronic kidney disease, stage 4 (severe): Secondary | ICD-10-CM

## 2019-08-02 LAB — EXTRACTABLE NUCLEAR ANTIGEN ANTIBODY

## 2019-08-02 LAB — BASIC METABOLIC PANEL
Anion gap: 13 (ref 5–15)
BUN: 131 mg/dL — ABNORMAL HIGH (ref 8–23)
CO2: 18 mmol/L — ABNORMAL LOW (ref 22–32)
Calcium: 8.4 mg/dL — ABNORMAL LOW (ref 8.9–10.3)
Chloride: 109 mmol/L (ref 98–111)
Creatinine, Ser: 3.14 mg/dL — ABNORMAL HIGH (ref 0.61–1.24)
GFR calc Af Amer: 19 mL/min — ABNORMAL LOW (ref >60)
GFR calc non Af Amer: 17 mL/min — ABNORMAL LOW (ref >60)
Glucose, Bld: 295 mg/dL — ABNORMAL HIGH (ref 70–99)
Potassium: 3.9 mmol/L (ref 3.5–5.1)
Sodium: 140 mmol/L (ref 135–145)

## 2019-08-02 LAB — ANA W/REFLEX IF POSITIVE

## 2019-08-02 LAB — GLUCOSE, CAPILLARY
Glucose-Capillary: 214 mg/dL — ABNORMAL HIGH (ref 70–99)
Glucose-Capillary: 180 mg/dL — ABNORMAL HIGH (ref 70–99)
Glucose-Capillary: 127 mg/dL — ABNORMAL HIGH (ref 70–99)
Glucose-Capillary: 126 mg/dL — ABNORMAL HIGH (ref 70–99)

## 2019-08-02 MED ORDER — METOPROLOL TARTRATE 5 MG/5ML IV SOLN
2.5000 mg | Freq: Three times a day (TID) | INTRAVENOUS | Status: DC
  Administered 2019-08-02 – 2019-08-03 (×3): 2.5 mg via INTRAVENOUS
  Filled 2019-08-02 (×3): qty 5

## 2019-08-02 MED ORDER — PANTOPRAZOLE SODIUM 40 MG IV SOLR
40.0000 mg | INTRAVENOUS | Status: DC
  Administered 2019-08-03: 40 mg via INTRAVENOUS
  Filled 2019-08-02: qty 40

## 2019-08-02 MED ORDER — ACETAMINOPHEN 325 MG PO TABS: 650.0000 mg | ORAL_TABLET | Freq: Four times a day (QID) | ORAL | Status: DC | PRN

## 2019-08-02 NOTE — Progress Notes (Signed)
All three time pt removed nonrebreather to takes PO meds, pt has a coughing spell and desats to 80s. Pt O2 slowly comes back up. Pt states he feels really short of breath when attempting to take pills.

## 2019-08-02 NOTE — Progress Notes (Signed)
NAME:  Erik Logan, MRN:  [id number], DOB:  08/28/1928, LOS: 8 ADMISSION DATE:  07/27/2019, CONSULTATION DATE: 07/30/2019 REFERRING MD: Dr. Bonner Puna, [facility], CHIEF COMPLAINT:  Short of breath   Brief History   86 yo former smoker with Lt sided chest pain and dyspnea from acute hypoxic respiratory failure from pneumonia in setting of emphysema and ILD with UIP pattern.  Past Medical History  DM type 2, CAD, HTN, HLD, PUD, CKD 2  Significant Hospital Events   5/09 Admit 5/10 needing Bipap  Consults:    Procedures:    Significant Diagnostic Tests:  Echo 5/11 >> EF 55 to 60%, grade 1 DD HRCT chest 5/14 >> atherosclerosis, severe centrilobular emphysema, bronchial wall thickening, moderate patchy consolidation with GGO b/l, patchy reticulation, mild traction BTX, mild architectural distortion, mild honeycombing, cluster of nodularity in LLL  Micro Data:  SARS CoV2 PCR 5/09 >> negative Influenza PCR 5/09 >> negative Blood 5/09 >> negative Sputum 5/14 >>   Antimicrobials:  Vancomycin 5/09 >> 5/14 Zithromax 5/09 >> 5/13 Cefepime 5/14 >>   Interim history/subjective:  Had a good day on 5/17, spent most of the day on 1.00 NRB Initially refused BiPAP at the beginning of the night last night, was placed on it this morning and has been on ever since. He is tachypneic, but complains of dyspnea.  Objective   Blood pressure (!) 148/75, pulse 95, temperature (!) 96.9 F (36.1 C), temperature source Axillary, resp. rate (!) 25, height 5\' 8"  (1.727 m), weight 82.9 kg, SpO2 99 %.    FiO2 (%):  [70 %] 70 %   Intake/Output Summary (Last 24 hours) at 08/02/2019 1408 Last data filed at 08/02/2019 0539 Gross per 24 hour  Intake 120 ml  Output 1100 ml  Net -980 ml   Filed Weights   07/31/19 0425 08/01/19 0417 08/02/19 0500  Weight: 87.3 kg 85.3 kg 82.9 kg    Examination: General: Elderly man, uncomfortable, laying in bed with BiPAP in place HEENT: Pharynx dry, no apparent upper airway  secretions or noise Neuro: Sleeping, awakes to voice, somewhat anxious, moves extremities CV: Regular, tachycardic PULM: Tachypneic but good air movement with good volumes on BiPAP.  Bilateral inspiratory crackles GI: Benign Extremities: No significant edema  Resolved Hospital Problem list     Assessment & Plan:   Acute hypoxic respiratory failure.  Multifactorial and due to apparent bacterial pneumonia superimposed on severe emphysema, probable associated interstitial disease.  Rheumatoid factor positive High flow nasal cannula versus 1.00 NRB, BiPAP as needed.  He remains air hungry despite adequate saturations, has not increased work of breathing.  I believe we need to treat his dyspnea symptomatically.  He has Dilaudid available.  Note that palliative care discussions are underway.  Think the likelihood of survival here is low and that we should consider transition to comfort. Day 5 cefepime for bacterial pneumonia Bronchodilators as ordered Empiric corticosteroids in the event that there is a component of flaring ILD.  I suspect that the focal infiltrates, pneumonia are the driving factor contributing to his respiratory failure here.  Okay to continue his corticosteroids at current dosing.  If he does rebound then we will help plan a taper.  Goals of care. - DNR/DNI - lengthy d/w pt's wife and son 5/16 given severe pneumonia on background of emphysema and ILD.  He is on maximal therapy.  If he worsens, consider transition to comfort care. Appreciate palliative care input.  I would favor transition to comfort at this  point given his dyspnea, BiPAP dependence.  Best practice:  Diet: carb modified, heart healthy DVT prophylaxis: SQ heparin GI prophylaxis: protonix Mobility: bed rest Code Status: DNR/DNI Disposition: progressive care  Labs    CMP Latest Ref Rng & Units 08/02/2019 08/01/2019 07/31/2019  Glucose 70 - 99 mg/dL 295(H) 159(H) 313(H)  BUN 8 - 23 mg/dL 131(H) 114(H) 103(H)    Creatinine 0.61 - 1.24 mg/dL 3.14(H) 2.97(H) 2.88(H)  Sodium 135 - 145 mmol/L 140 137 133(L)  Potassium 3.5 - 5.1 mmol/L 3.9 5.1 3.7  Chloride 98 - 111 mmol/L 109 108 104  CO2 22 - 32 mmol/L 18(L) 19(L) 17(L)  Calcium 8.9 - 10.3 mg/dL 8.4(L) 8.5(L) 8.4(L)    CBC Latest Ref Rng & Units 08/01/2019 07/31/2019 07/30/2019  WBC 4.0 - 10.5 K/uL 23.6(H) 23.3(H) 18.8(H)  Hemoglobin 13.0 - 17.0 g/dL 11.6(L) 11.1(L) 11.6(L)  Hematocrit 39.0 - 52.0 % 34.3(L) 33.4(L) 34.4(L)  Platelets 150 - 400 K/uL 301 279 205    CBG (last 3)  Recent Labs    08/01/19 2128 08/02/19 0824 08/02/19 1137  GLUCAP 195* 214* 180*    Signature:   Baltazar Apo, MD, PhD 08/02/2019, 2:13 PM [facility] [phone] or if no answer [phone]

## 2019-08-02 NOTE — Progress Notes (Signed)
OT Cancellation Note  Patient Details Name: Erik Logan MRN: [id number] DOB: June 29, 1928   Cancelled Treatment:    Reason Eval/Treat Not Completed: Medical issues which prohibited therapy;Other (comment) Pt requiring increased O2 with pt currently on BiPap.RN suggest holding off for today.  Will check back for OT session pending medical improvement.   Lanier Clam., COTA/L Acute Rehabilitation Services [phone] [place] 08/02/2019, 12:33 PM

## 2019-08-02 NOTE — Progress Notes (Signed)
Patient transferred to the chair while on bipap, tolerated well.  Completed a bath and transitioned to humidified 15L HFNC, sats remain 90-94%.  WOB relieved with IV pain med.  Patient thankful and able to tolerate water, a strawberry icey a whole ensure.  Patient resting quietly and prefers the chair to the bed at this point.  Patient voiced he is depressed.  VS documented and son updated on plan of care.

## 2019-08-02 NOTE — Consult Note (Signed)
Consultation Note Date: 08/02/2019   Patient Name: Erik Logan  DOB: 16-Jun-1928  MRN: [id number]  Age / Sex: 86 y.o., male  PCP: Neale Burly, MD Referring Physician: Patrecia Pour, MD  Reason for Consultation: Establishing goals of care and Psychosocial/spiritual support  HPI/Patient Profile: 86 y.o. male   admitted on 07/20/2019 past  medical history significant for insulin-dependent diabetes mellitus, hypertension, coronary artery disease, chronic diastolic CHF, and chronic kidney disease stage IV, admitted thru  the emergency department for evaluation of shortness of breath and chest pain.  Reported/per patient  increased dyspnea for months, with bilateral lower extremity swelling.   Today is day 9 of this hospitalization and unfortunately patient has continued to decline with maximum medical support.  He currently is BiPap dependant.  Patient and family face treatment option decisions, advanced directive decision  and anticipatory care needs.    Clinical Assessment and Goals of Care:   This NP Wadie Lessen reviewed medical records, received report from team, assessed the patient and then spoke to both wife and son by phone  to discuss diagnosis, prognosis, GOC, EOL wishes disposition and options.   Concept of Palliative Care was introduced.   A  discussion was had today regarding advanced directives.   The difference between a aggressive medical intervention path  and a palliative comfort care path for this patient at this time was had.  Values and goals of care important to patient and family were attempted to be elicited.  Education offered regarding overall failure to thrive and the limitations of medical interventions to prolong quality of life when a body begins to fail to thrive.    Questions and concerns addressed.  Patient  encouraged to call with questions or concerns.     PMT will  continue to support holistically.          No documented HPOA or AD .  Patient's wife and son work together for medical decisions.     SUMMARY OF RECOMMENDATIONS    Code Status/Advance Care Planning:  DNR   Palliative Prophylaxis:   Aspiration, Bowel Regimen, Delirium Protocol, Frequent Pain Assessment and Oral Care  Additional Recommendations (Limitations, Scope, Preferences):  Full Scope Treatment   Plan is to meet tomorrow at 1100 for ongoing discussion regarding GOCs  Psycho-social/Spiritual:   Desire for further Chaplaincy support:no  Additional Recommendations: Created space and opportunity for family to explore their thoughts and feeling regarding current medical situation.  Son verbalizes confusion over conflicting information from different providers. "I thought he was doing better yesterday"    Attending to talk with son again today  Prognosis:   Long term prognosis is poor.  Will depends on desire for life prolonging measures  Discharge Planning: to be determined     Primary Diagnoses: Present on Admission: . Multifocal pneumonia . CKD (chronic kidney disease) stage 4, GFR 15-29 ml/min (HCC) . Essential (primary) hypertension . Acute on chronic diastolic CHF (congestive heart failure) ([place]) . Acute respiratory failure with hypoxia ([place]) . Hyperlipidemia .  Coronary artery disease . DNR (do not resuscitate) . UIP (usual interstitial pneumonitis) ([place]) . Emphysema of lung ([place]) . Overweight   I have reviewed the medical record, interviewed the patient and family, and examined the patient. The following aspects are pertinent.  Past Medical History:  Diagnosis Date  . Chronic renal insufficiency, stage 2 (mild)   . Coronary artery disease   . History of duodenal ulcer 1970s  . Hyperlipidemia   . Hypertension   . NSTEMI (non-ST elevated myocardial infarction) ([place]) 06/01/2017  . SCC (squamous cell carcinoma) Well Diff 10/10/2015   Left Wrist  (Cx3,5FU)  . Squamous cell carcinoma in situ (SCCIS) 10/10/2015   Left Arm (Cx3,5FU)  . Squamous cell carcinoma in situ (SCCIS) x3 08/25/2018   Left Hand Medial, Left Hand Lateral and Left Arm  . Type II diabetes mellitus ([place])    Social History   Socioeconomic History  . Marital status: Married    Spouse name: Not on file  . Number of children: Not on file  . Years of education: Not on file  . Highest education level: Not on file  Occupational History  . Occupation: Retired  Tobacco Use  . Smoking status: Former Smoker    Packs/day: 1.00    Years: 32.00    Pack years: 32.00    Types: Cigarettes  . Smokeless tobacco: Never Used  . Tobacco comment: Quit around age 3  Substance and Sexual Activity  . Alcohol use: No  . Drug use: No  . Sexual activity: Not Currently  Other Topics Concern  . Not on file  Social History Narrative  . Not on file   Social Determinants of Health   Financial Resource Strain:   . Difficulty of Paying Living Expenses:   Food Insecurity:   . Worried About Charity fundraiser in the Last Year:   . Arboriculturist in the Last Year:   Transportation Needs:   . Film/video editor (Medical):   Marland Kitchen Lack of Transportation (Non-Medical):   Physical Activity:   . Days of Exercise per Week:   . Minutes of Exercise per Session:   Stress:   . Feeling of Stress :   Social Connections:   . Frequency of Communication with Friends and Family:   . Frequency of Social Gatherings with Friends and Family:   . Attends Religious Services:   . Active Member of Clubs or Organizations:   . Attends Archivist Meetings:   Marland Kitchen Marital Status:    Family History  Problem Relation Age of Onset  . Diabetes Mother   . Hypertension Mother   . Pulmonary disease Father   . Pulmonary disease Sister   . Healthy Sister    Scheduled Meds: . acetaminophen  650 mg Oral Q6H  . amLODipine  5 mg Oral Daily  . aspirin EC  81 mg Oral Daily  . atorvastatin  40 mg  Oral Daily  . budesonide (PULMICORT) nebulizer solution  0.5 mg Nebulization BID  . heparin  5,000 Units Subcutaneous Q8H  . insulin aspart  0-20 Units Subcutaneous TID WC  . insulin aspart  0-5 Units Subcutaneous QHS  . insulin aspart  5 Units Subcutaneous TID WC  . insulin detemir  20 Units Subcutaneous BID  . ipratropium-albuterol  3 mL Nebulization BID  . isosorbide mononitrate  30 mg Oral Daily  . mouth rinse  15 mL Mouth Rinse BID  . methylPREDNISolone (SOLU-MEDROL) injection  60 mg Intravenous Q6H  .  metoprolol tartrate  12.5 mg Oral Daily  . pantoprazole  40 mg Oral Daily  . sodium chloride flush  3 mL Intravenous Q12H   Continuous Infusions: . sodium chloride    . ceFEPime (MAXIPIME) IV 2 g (08/02/19 0853)   PRN Meds:.sodium chloride, albuterol, antiseptic oral rinse, guaiFENesin-dextromethorphan, HYDROmorphone (DILAUDID) injection, lip balm, LORazepam, morphine injection, ondansetron **OR** ondansetron (ZOFRAN) IV, senna-docusate, sodium chloride flush, sodium chloride flush Medications Prior to Admission:  Prior to Admission medications   Medication Sig Start Date End Date Taking? Authorizing Provider  amLODipine (NORVASC) 5 MG tablet Take 1 tablet by mouth once daily 06/06/19  Yes Branch, Alphonse Guild, MD  aspirin EC 81 MG tablet Take 1 tablet (81 mg total) by mouth daily. 06/03/17  Yes Bhagat, Bhavinkumar, PA  atorvastatin (LIPITOR) 40 MG tablet Take 1 tablet by mouth once daily 06/06/19  Yes Branch, Alphonse Guild, MD  furosemide (LASIX) 40 MG tablet Take 20-40 mg by mouth daily. Take 1/2 tablet daily, take an additional 1 tablet with noticeable swelling.   Yes [provider]  insulin lispro (HUMALOG) 100 UNIT/ML injection Inject 5 Units into the skin See admin instructions. Per Sliding Scale   Yes [provider]  isosorbide mononitrate (IMDUR) 30 MG 24 hr tablet Take 1 tablet by mouth once daily 12/24/18  Yes Branch, Alphonse Guild, MD  losartan (COZAAR) 25 MG tablet  Take 1 tablet (25 mg total) by mouth daily. 07/05/19 10/03/19 Yes Verta Ellen., NP  metoprolol tartrate (LOPRESSOR) 25 MG tablet Take 0.5 tablets (12.5 mg total) by mouth daily. 06/03/17  Yes Bhagat, Bhavinkumar, PA  nitroGLYCERIN (NITROSTAT) 0.4 MG SL tablet DISSOLVE ONE TABLET UNDER THE TONGUE EVERY 5 MINUTES AS NEEDED FOR CHEST PAIN.  DO NOT EXCEED A TOTAL OF 3 DOSES IN 15 MINUTES Patient taking differently: Place 0.4 mg under the tongue every 5 (five) minutes as needed for chest pain. Do not exceed 3 doses in 15 minutes. 07/09/18  Yes BranchAlphonse Guild, MD  omeprazole (PRILOSEC) 20 MG capsule Take 20 mg by mouth daily as needed (Heartburn).    Yes [provider]  Polyethyl Glycol-Propyl Glycol (SYSTANE OP) Apply 2 drops to eye daily as needed (Dry eyes).   Yes [provider]  TOUJEO SOLOSTAR 300 UNIT/ML SOPN Inject 20 Units into the skin 2 (two) times daily.  03/25/18  Yes [provider]   Allergies  Allergen Reactions  . Beef-Derived Products Hives    Red meat  . Sulfa Antibiotics Swelling   Review of Systems  Unable to perform ROS: Acuity of condition    Physical Exam Constitutional:      Appearance: He is underweight.     Interventions: Face mask in place.  Cardiovascular:     Rate and Rhythm: Normal rate.  Pulmonary:     Effort: Tachypnea present.  Skin:    General: Skin is warm and dry.  Neurological:     Mental Status: He is lethargic.     Vital Signs: BP 123/64 (BP Location: Right Arm)   Pulse 92   Temp (!) 96.7 F (35.9 C) (Axillary)   Resp (!) 28   Ht 5\' 8"  (1.727 m)   Wt 82.9 kg   SpO2 91%   BMI 27.79 kg/m  Pain Scale: 0-10 POSS *See Group Information*: 1-Acceptable,Awake and alert Pain Score: Asleep   SpO2: SpO2: 91 % O2 Device:SpO2: 91 % O2 Flow Rate: .O2 Flow Rate (L/min): 15 L/min  IO: Intake/output summary:  Intake/Output Summary (Last 24 hours) at 08/02/2019 5110 Last data filed at 08/02/2019 0539 Gross per 24  hour  Intake 240 ml  Output 1300 ml  Net -1060 ml    LBM: Last BM Date: 07/31/19 Baseline Weight: Weight: 87.2 kg Most recent weight: Weight: 82.9 kg     Palliative Assessment/Data: 30 % at best   Discussed with Dr Bonner Puna  Time In: 1100 Time Out: 1210 Time Total: 70 minutes Greater than 50%  of this time was spent counseling and coordinating care related to the above assessment and plan.  Signed by: Wadie Lessen, NP   Please contact Palliative Medicine Team phone at [phone] for questions and concerns.  For individual provider: See Shea Evans

## 2019-08-02 NOTE — Progress Notes (Signed)
PROGRESS NOTE    Erik Logan  HFW:[id number] DOB: 08/07/1928 DOA: 08/13/2019 PCP: Neale Burly, MD   Brief Narrative:  Patient was admitted to the hospital with a working diagnosis of acute hypoxic respiratory failure due to multifocal community-acquired pneumonia (present on admission).  86 year old male with significant past medical history for hypertension, type 2 diabetes mellitus, coronary artery disease, chronic diastolic heart failure, and chronic kidney disease stage IV.  Patient reported worsening dyspnea for several weeks, at one point it was associated with lower extremity edema that transiently improved with increased dose of furosemide.  Due to persistent cough he received a short course of prednisone, with no significant improvement of his symptoms, he developed pleuritic chest pain mainly on the right side.  On his initial physical examination his oximetry was 80% on room air, blood pressure was 130/66, heart rate 75 and respiratory 23, his lungs had no significant wheezing or rales, heart S1-S2 present and rhythmic, tachycardic, abdomen soft, no lower extremity edema. Sodium 134, potassium 4.9, chloride 1 1, bicarb 21, glucose 417, BUN 34, creatinine 1.5.  Troponin I 105.  4.3, hemoglobin 15.4, hematocrit 46.5, platelets 146.  SARS Covid 19 negative.  Chest radiograph with multifocal interstitial infiltrates, all 4 quadrants.  EKG 81 bpm, left axis deviation, left anterior fascicular block, first degree AV block, sinus rhythm, no ST segment or T wave changes.   Patient developed respiratory distress and required non invasive mechanical ventilation and furosemide which modestly improved symptoms, though hypoxemia progressed. Pulmonology was consulted and antibiotics were broadened empirically. HRCT was performed revealing emphysema, ILD with probable UIP pattern and +RF. Steroids were started, though he has continued to require NIPPV and increasing flow with HFNC. DNR is confirmed  and, though goal of care is still curative, dilaudid is being used for dyspnea to assist with management.   Assessment & Plan:   Principal Problem:   Acute respiratory failure with hypoxia (HCC) Active Problems:   Essential (primary) hypertension   Hyperlipidemia   Type 2 diabetes mellitus without complication, with long-term current use of insulin (HCC)   CKD (chronic kidney disease) stage 4, GFR 15-29 ml/min (HCC)   Multifocal pneumonia   Acute on chronic diastolic CHF (congestive heart failure) (HCC)   Coronary artery disease   DNR (do not resuscitate)   UIP (usual interstitial pneumonitis) (HCC)   Emphysema of lung ([place])   History of tobacco abuse   Overweight   Steroid-induced hyperglycemia  Acute hypoxic respiratory failure due to multifocal community acquired pneumonia, UIP with flare and emphysema: HRCT suggestive of UIP.  - Started steroids 5/14. - Continue cefepime. MRSA PCR negative. Personal review of CXR this AM shows improved consolidation on right and better aeration. Sputum Cx normal flora.  - Continue supplemental oxygen, goal is SpO2 of 88% or higher and improved respiratory effort. Respiratory distress seems to not be improving and we have consulted palliative care team for assistance in  - Cultures NGTD  - Minimize po medications given BiPAP dependence  Emphysema, COPD: Diagnosed at previous hospitalization, has extensive tobacco use history. No current wheezing. - Continue bronchodilators. Steroids as above.  Acute on chronic diastolic heart failure. Echocardiogram from 2019 with preserved LV systolic function 55 to 02%. Updated echocardiogram during this admission similar to prior w/G1DD, no new WMA's noted. IVC suggestive of normal RA pressures.  - Given lasix last 5/16. - Not currently peripherally overloaded. AKI stable. - Continue metoprolol, isosorbide as below.  AKI on CKD stage IV with  non gap metabolic acidosis: - Monitoring daily, restarted  diuretic given severity of respiratory failure. Cr up further and not taking much fluids, will not repeat. Cr up more today. Will not give lasix. Hold IV fluids for right now given respiratory failure. - BUN elevated mostly from steroids. Cr up. Also got vancomycin x1 which is stopped. - Avoid hypotension and nephrotoxic medications.   IDT2DM with hypoglycemia: - Made upward adjustments of insulin, now improved also due to his inability to take po significantly due to respiratory distress. - Continue SSI.  Anxiety:  - Low dose ativan prn. Risks of respiratory depression and subsequent hypercarbia and death discussed with patient. This is a component of hybrid goal of care including aim at cure but conceding to need for symptomatic palliation.  Dyslipidemia:  - Continue statin  CAD s/p PCI: No clinical signs of acute coronary syndrome, chest pain seems to be more pleuritic. Troponin I elevation in the setting of decompensated diastolic heart failure is due to demand ischemia. No ischemic changes on ECG. - No inpatient work up planned.  - Continue home medications inclusive of isosorbide, beta blocker, statin.   GERD:  - Continue PPI  Status is: Inpatient Remains inpatient appropriate because:IV treatments appropriate due to intensity of illness or inability to take PO Continues to have respiratory distress.   Dispo: The patient is from: Home              Anticipated d/c is to: TBD based on clinical trajectory. Will consult palliative care.              Anticipated d/c date is: >3 days              Patient currently is not medically stable to d/c.     DVT prophylaxis: Enoxaparin   Code Status:   DNR  Family Communication: Multiple family members daily. Given his precarious clinical status, this patient should have relaxed visitation privileges. Will discuss again with wife and son at bedside this afternoon when son is available/off work.  Antimicrobials:   Ceftriaxone and  azithromycin    Cefepime  Subjective: Having a bad morning after a bad night. Short of breath despite BiPAP. Miserable, wants water but can't make it without severe dyspnea that is constant and worsening  Objective: Vitals:   08/02/19 1138 08/02/19 1200 08/02/19 1206 08/02/19 1300  BP: (!) 153/72 (!) 149/72  (!) 148/75  Pulse: 92  95   Resp: (!) 27 (!) 27 (!) 28 (!) 25  Temp: (!) 96.7 F (35.9 C) 97.6 F (36.4 C)  (!) 96.9 F (36.1 C)  TempSrc: Axillary Axillary  Axillary  SpO2: 98% 97% 100% 99%  Weight:      Height:        Intake/Output Summary (Last 24 hours) at 08/02/2019 1419 Last data filed at 08/02/2019 0539 Gross per 24 hour  Intake 120 ml  Output 1100 ml  Net -980 ml   Filed Weights   07/31/19 0425 08/01/19 0417 08/02/19 0500  Weight: 87.3 kg 85.3 kg 82.9 kg    Examination:  Gen: 86 y.o. male in no distress Pulm: Labored and tachypneic. + bilateral crackles. CV: Regular rate and rhythm. No murmur, rub, or gallop. No JVD, no dependent edema. GI: Abdomen soft, non-tender, non-distended, with normoactive bowel sounds.  Ext: Warm, no deformities Skin: No rashes, lesions or ulcers on visualized skin. Neuro: Alert and oriented. No focal neurological deficits. Psych: Judgement and insight appear fair. Mood euthymic & affect congruent.  Behavior is appropriate.    Data Reviewed: I have personally reviewed following labs and imaging studies  CBC: Recent Labs  Lab 07/27/19 0339 07/27/19 0339 07/28/19 0323 07/29/19 0436 07/30/19 0228 07/31/19 0239 08/01/19 0212  WBC 22.3*   < > 18.1* 18.9* 18.8* 23.3* 23.6*  NEUTROABS 19.7*  --   --   --   --   --   --   HGB 12.7*   < > 12.3* 13.8 11.6* 11.1* 11.6*  HCT 37.8*   < > 37.4* 40.2 34.4* 33.4* 34.3*  MCV 88.7   < > 87.6 87.0 86.4 86.5 86.8  PLT 180   < > 161 175 205 279 301   < > = values in this interval not displayed.   Basic Metabolic Panel: Recent Labs  Lab 07/29/19 0436 07/30/19 0228 07/31/19 0239  08/01/19 0212 08/02/19 0237  NA 135 131* 133* 137 140  K 3.8 4.0 3.7 5.1 3.9  CL 104 103 104 108 109  CO2 16* 17* 17* 19* 18*  GLUCOSE 199* 296* 313* 159* 295*  BUN 69* 80* 103* 114* 131*  CREATININE 2.51* 2.46* 2.88* 2.97* 3.14*  CALCIUM 8.4* 8.2* 8.4* 8.5* 8.4*   Coagulation Profile: No results for input(s): INR, PROTIME in the last 168 hours. CBG: Recent Labs  Lab 08/01/19 1150 08/01/19 1604 08/01/19 2128 08/02/19 0824 08/02/19 1137  GLUCAP 154* 154* 195* 214* 180*   Scheduled Meds: . aspirin EC  81 mg Oral Daily  . budesonide (PULMICORT) nebulizer solution  0.5 mg Nebulization BID  . heparin  5,000 Units Subcutaneous Q8H  . insulin aspart  0-20 Units Subcutaneous TID WC  . insulin aspart  0-5 Units Subcutaneous QHS  . insulin aspart  5 Units Subcutaneous TID WC  . insulin detemir  20 Units Subcutaneous BID  . ipratropium-albuterol  3 mL Nebulization BID  . isosorbide mononitrate  30 mg Oral Daily  . mouth rinse  15 mL Mouth Rinse BID  . methylPREDNISolone (SOLU-MEDROL) injection  60 mg Intravenous Q6H  . metoprolol tartrate  2.5 mg Intravenous Q8H  . [START ON 08/03/2019] pantoprazole (PROTONIX) IV  40 mg Intravenous Q24H  . sodium chloride flush  3 mL Intravenous Q12H   Continuous Infusions: . sodium chloride    . ceFEPime (MAXIPIME) IV 2 g (08/02/19 0853)     LOS: 8 days    Time spent: 35 minutes  Patrecia Pour, MD

## 2019-08-02 NOTE — Plan of Care (Signed)
?  Problem: Education: ?Goal: Knowledge of General Education information will improve ?Description: Including pain rating scale, medication(s)/side effects and non-pharmacologic comfort measures ?Outcome: Progressing ?  ?Problem: Health Behavior/Discharge Planning: ?Goal: Ability to manage health-related needs will improve ?Outcome: Progressing ?  ?Problem: Clinical Measurements: ?Goal: Ability to maintain clinical measurements within normal limits will improve ?Outcome: Progressing ?Goal: Will remain free from infection ?Outcome: Progressing ?Goal: Diagnostic test results will improve ?Outcome: Progressing ?Goal: Cardiovascular complication will be avoided ?Outcome: Progressing ?  ?Problem: Nutrition: ?Goal: Adequate nutrition will be maintained ?Outcome: Progressing ?  ?Problem: Coping: ?Goal: Level of anxiety will decrease ?Outcome: Progressing ?  ?Problem: Elimination: ?Goal: Will not experience complications related to bowel motility ?Outcome: Progressing ?Goal: Will not experience complications related to urinary retention ?Outcome: Progressing ?  ?Problem: Pain Managment: ?Goal: General experience of comfort will improve ?Outcome: Progressing ?  ?Problem: Safety: ?Goal: Ability to remain free from injury will improve ?Outcome: Progressing ?  ?Problem: Skin Integrity: ?Goal: Risk for impaired skin integrity will decrease ?Outcome: Progressing ?  ?

## 2019-08-02 NOTE — TOC Initial Note (Addendum)
Transition of Care [facility]) - Initial/Assessment Note    Patient Details  Name: Erik Logan MRN: [id number] Date of Birth: 1928/04/24  [facility] CM/SW Contact:    Angelita Ingles, RN Phone Number: [phone]  08/02/2019, 3:45 PM  Clinical Narrative:                 CM has made several attempts to assess patients needs. Patient is severely SOB and unable to maintain a conversation other than a few yes/ no questions and nods. CM will continue to follow patient.    Expected Discharge Plan: [place] Barriers to Discharge: Continued Medical Work up   Patient Goals and CMS Choice     Choice offered to / list presented to : NA  Expected Discharge Plan and Services Expected Discharge Plan: [place] In-house Referral: NA Discharge Planning Services: CM Consult Post Acute Care Choice: NA Living arrangements for the past 2 months: Single Family Home                 DME Arranged: N/A DME Agency: NA       HH Arranged: NA HH Agency: NA        Prior Living Arrangements/Services Living arrangements for the past 2 months: Single Family Home   Patient language and need for interpreter reviewed:: Yes Do you feel safe going back to the place where you live?: (patient is SOB and unable to participate in conversation)      Need for Family Participation in Patient Care: Yes (Comment) Care giver support system in place?: Yes (comment)   Criminal Activity/Legal Involvement Pertinent to Current Situation/Hospitalization: No - Comment as needed  Activities of Daily Living      Permission Sought/Granted                  Emotional Assessment Appearance:: Appears stated age Attitude/Demeanor/Rapport: Unable to Assess Affect (typically observed): Unable to Assess Orientation: : Oriented to Self, Oriented to Place, Oriented to  Time, Oriented to Situation Alcohol / Substance Use: Not Applicable Psych Involvement: No  (comment)  Admission diagnosis:  Shortness of breath [R06.02] Pneumonia [J18.9] Hypoxia [R09.02] Elevated troponin [R77.8] AKI (acute kidney injury) ([place]) [N17.9] Leukocytosis, unspecified type [D72.829] Community acquired pneumonia of right lung, unspecified part of lung [J18.9] Patient Active Problem List   Diagnosis Date Noted  . Coronary artery disease   . DNR (do not resuscitate)   . UIP (usual interstitial pneumonitis) ([place])   . Emphysema of lung ([place])   . History of tobacco abuse   . Overweight   . Steroid-induced hyperglycemia   . Multifocal pneumonia 07/25/2019  . Acute on chronic diastolic CHF (congestive heart failure) ([place])   . Acute respiratory failure with hypoxia ([place])   . Non-ST elevated myocardial infarction (non-STEMI) ([place]) 06/01/2017  . Essential (primary) hypertension 06/01/2017  . Hyperlipidemia 06/01/2017  . Type 2 diabetes mellitus without complication, with long-term current use of insulin ([place]) 06/01/2017  . CKD (chronic kidney disease) stage 4, GFR 15-29 ml/min (HCC) 06/01/2017  . Back pain 06/01/2017   PCP:  Neale Burly, MD Pharmacy:   [facility] [address], [place] [address] Phone: [phone] Fax: [phone]     Social Determinants of Health (SDOH) Interventions    Readmission Risk Interventions No flowsheet data found.

## 2019-08-03 DIAGNOSIS — F419 Anxiety disorder, unspecified: Secondary | ICD-10-CM | POA: Diagnosis present

## 2019-08-03 LAB — GLUCOSE, CAPILLARY
Glucose-Capillary: 205 mg/dL — ABNORMAL HIGH (ref 70–99)
Glucose-Capillary: 216 mg/dL — ABNORMAL HIGH (ref 70–99)

## 2019-08-03 LAB — BASIC METABOLIC PANEL
Anion gap: 16 — ABNORMAL HIGH (ref 5–15)
BUN: 154 mg/dL — ABNORMAL HIGH (ref 8–23)
CO2: 17 mmol/L — ABNORMAL LOW (ref 22–32)
Calcium: 8.7 mg/dL — ABNORMAL LOW (ref 8.9–10.3)
Chloride: 113 mmol/L — ABNORMAL HIGH (ref 98–111)
Creatinine, Ser: 3.31 mg/dL — ABNORMAL HIGH (ref 0.61–1.24)
GFR calc Af Amer: 18 mL/min — ABNORMAL LOW (ref >60)
GFR calc non Af Amer: 16 mL/min — ABNORMAL LOW (ref >60)
Glucose, Bld: 235 mg/dL — ABNORMAL HIGH (ref 70–99)
Potassium: 5.6 mmol/L — ABNORMAL HIGH (ref 3.5–5.1)
Sodium: 146 mmol/L — ABNORMAL HIGH (ref 135–145)

## 2019-08-03 LAB — PROCALCITONIN: Procalcitonin: 0.73 ng/mL

## 2019-08-03 MED ORDER — CALCIUM GLUCONATE 10 % IV SOLN
1.0000 g | Freq: Once | INTRAVENOUS | Status: DC
  Filled 2019-08-03: qty 10

## 2019-08-03 MED ORDER — MORPHINE SULFATE (PF) 2 MG/ML IV SOLN
2.0000 mg | INTRAVENOUS | Status: DC | PRN
  Administered 2019-08-03 (×3): 2 mg via INTRAVENOUS
  Filled 2019-08-03 (×3): qty 1

## 2019-08-03 MED ORDER — LORAZEPAM 2 MG/ML IJ SOLN
2.0000 mg | INTRAMUSCULAR | Status: DC | PRN
  Administered 2019-08-03 – 2019-08-04 (×9): 2 mg via INTRAVENOUS
  Filled 2019-08-03 (×9): qty 1

## 2019-08-03 MED ORDER — LACTATED RINGERS IV BOLUS
1000.0000 mL | Freq: Once | INTRAVENOUS | Status: AC
  Administered 2019-08-03: 1000 mL via INTRAVENOUS

## 2019-08-03 MED ORDER — SODIUM ZIRCONIUM CYCLOSILICATE 10 G PO PACK: 10.0000 g | PACK | Freq: Every day | ORAL | Status: DC

## 2019-08-03 MED ORDER — MORPHINE SULFATE (PF) 2 MG/ML IV SOLN
2.0000 mg | INTRAVENOUS | Status: DC | PRN
  Administered 2019-08-03 – 2019-08-04 (×10): 2 mg via INTRAVENOUS
  Filled 2019-08-03 (×10): qty 1

## 2019-08-03 MED ORDER — LORAZEPAM 2 MG/ML IJ SOLN
2.0000 mg | INTRAMUSCULAR | Status: DC | PRN
  Administered 2019-08-03: 2 mg via INTRAVENOUS
  Filled 2019-08-03: qty 1

## 2019-08-03 NOTE — Progress Notes (Addendum)
PROGRESS NOTE    Erik Logan  XTK:[id number] DOB: January 22, 1929 DOA: 07/17/2019 PCP: Neale Burly, MD   Brief Narrative:  Patient was admitted to the hospital with a working diagnosis of acute hypoxic respiratory failure due to multifocal community-acquired pneumonia (present on admission).  86 year old male with significant past medical history for hypertension, type 2 diabetes mellitus, coronary artery disease, chronic diastolic heart failure, and chronic kidney disease stage IV.  Patient reported worsening dyspnea for several weeks, at one point it was associated with lower extremity edema that transiently improved with increased dose of furosemide.  Due to persistent cough he received a short course of prednisone, with no significant improvement of his symptoms, he developed pleuritic chest pain mainly on the right side.  On his initial physical examination his oximetry was 80% on room air, blood pressure was 130/66, heart rate 75 and respiratory 23, his lungs had no significant wheezing or rales, heart S1-S2 present and rhythmic, tachycardic, abdomen soft, no lower extremity edema. Sodium 134, potassium 4.9, chloride 1 1, bicarb 21, glucose 417, BUN 34, creatinine 1.5.  Troponin I 105.  4.3, hemoglobin 15.4, hematocrit 46.5, platelets 146.  SARS Covid 19 negative.  Chest radiograph with multifocal interstitial infiltrates, all 4 quadrants.  EKG 81 bpm, left axis deviation, left anterior fascicular block, first degree AV block, sinus rhythm, no ST segment or T wave changes.   Patient developed respiratory distress and required non invasive mechanical ventilation and furosemide which modestly improved symptoms, though hypoxemia progressed. Pulmonology was consulted and antibiotics were broadened empirically. HRCT was performed revealing emphysema, ILD with probable UIP pattern and +RF. Steroids were started, though he has continued to require NIPPV and increasing flow with HFNC. DNR was confirmed.  After several days of requiring NIPPV and HFNC with worsening respiratory distress requiring IV narcotics and anxiety requiring ativan, the medical and palliative care teams met with the family and decided comfort measures is the best path forward as the patient will not survive this illness.   Assessment & Plan:   Principal Problem:   Acute respiratory failure with hypoxia (HCC) Active Problems:   Essential (primary) hypertension   Hyperlipidemia   Type 2 diabetes mellitus without complication, with long-term current use of insulin (HCC)   CKD (chronic kidney disease) stage 4, GFR 15-29 ml/min (HCC)   Multifocal pneumonia   Acute on chronic diastolic CHF (congestive heart failure) (HCC)   Coronary artery disease   DNR (do not resuscitate)   UIP (usual interstitial pneumonitis) (HCC)   Emphysema of lung ([place])   History of tobacco abuse   Overweight   Steroid-induced hyperglycemia   Palliative care by specialist   Anxiety  Acute hypoxic respiratory failure due to multifocal community acquired pneumonia, UIP with flare and emphysema: HRCT suggestive of UIP.  - Started steroids 5/14. - Continue cefepime. MRSA PCR negative. Sputum Cx normal flora.  - Continue supplemental oxygen as needed for comfort in addition to narcotics for air hunger. Palliative care discussions have been continued daily, and now we will transition to comfort measures. Due to clinical instability, the patient will not be transferred.  - Cultures NGTD   Emphysema, COPD: Diagnosed at previous hospitalization, has extensive tobacco use history. No current wheezing. - Continue bronchodilators. Steroids as above.  Acute on chronic diastolic heart failure. Echocardiogram from 2019 with preserved LV systolic function 55 to 99%. Updated echocardiogram during this admission similar to prior w/G1DD, no new WMA's noted. IVC suggestive of normal RA pressures.  -  Given lasix last 5/16, worsening creatinine would suggest the  absence of volume overload.  AKI on CKD stage IV with non gap metabolic acidosis: - Monitoring daily, restarted diuretic given severity of respiratory failure. Cr up further and not taking much fluids, will not repeat. Cr up more today. Will not give lasix. Hold IV fluids for right now given respiratory failure. - BUN elevated mostly from steroids. Cr up. Also got vancomycin x1 which is stopped. - Avoid hypotension and nephrotoxic medications.   Hyperkalemia: Worsening with progressive renal failure.  - Initiated treatments though with transition to comfort measures, these are not necessary.  - DC monitoring given goals of care.  IDT2DM Uncontrolled with hyperglycemia and with hypoglycemia: - Made upward adjustments of insulin, now improved also due to his inability to take po significantly due to respiratory distress. - Continue SSI.  Anxiety:  - Treat with ativan.   Dyslipidemia:  - Continue statin  CAD s/p PCI: No clinical signs of acute coronary syndrome, chest pain seems to be more pleuritic. Troponin I elevation in the setting of decompensated diastolic heart failure is due to demand ischemia. No ischemic changes on ECG. - No inpatient work up planned.   GERD:  - Continue PPI IV  Status is: Inpatient Remains inpatient appropriate because:IV treatments appropriate due to intensity of illness or inability to take PO Continues to have respiratory distress.   Dispo: The patient is from: Home              Anticipated inpatient demise, not stable enough for transport and is on comfort measures only              Patient currently is not medically stable to d/c.     DVT prophylaxis: Enoxaparin   Code Status:   DNR  Family Communication: Multiple family members daily. Spoke with son by phone for 15 minutes this morning prior to family meeting. Antimicrobials:   Ceftriaxone and azithromycin    Cefepime  Subjective: Got up and had an ensure last night but promptly needed to  get back on BiPAP and this morning required ativan and dilaudid due to respiratory distress and anxiety despite BiPAP.  Objective: Vitals:   08/03/19 0400 08/03/19 0754 08/03/19 0800 08/03/19 1000  BP:    132/70  Pulse: 98 89    Resp: (!) 34 (!) 25  (!) 22  Temp:   (!) 96 F (35.6 C) 97.6 F (36.4 C)  TempSrc:   Axillary Axillary  SpO2: 90% 91%  94%  Weight:      Height:        Intake/Output Summary (Last 24 hours) at 08/03/2019 1633 Last data filed at 08/03/2019 1430 Gross per 24 hour  Intake 240 ml  Output 850 ml  Net -610 ml   Filed Weights   08/01/19 0417 08/02/19 0500 08/03/19 0240  Weight: 85.3 kg 82.9 kg 81.3 kg    Examination:  Gen: 86 y.o. male in no distress Pulm: Belly breathing while on BiPAP at 12/6, crackles diffusely CV: Regular rate and rhythm. No murmur, rub, or gallop. No JVD, no dependent edema. GI: Abdomen soft, non-tender, non-distended, with normoactive bowel sounds.  Ext: Warm, no deformities Skin: No new rashes, lesions or ulcers on visualized skin. Neuro: Rousable but drowsy, no new focal neurological deficits. Psych: UTD  Data Reviewed: I have personally reviewed following labs and imaging studies  CBC: Recent Labs  Lab 07/28/19 0323 07/29/19 0436 07/30/19 0228 07/31/19 0239 08/01/19 0212  WBC 18.1*  18.9* 18.8* 23.3* 23.6*  HGB 12.3* 13.8 11.6* 11.1* 11.6*  HCT 37.4* 40.2 34.4* 33.4* 34.3*  MCV 87.6 87.0 86.4 86.5 86.8  PLT 161 175 205 279 606   Basic Metabolic Panel: Recent Labs  Lab 07/30/19 0228 07/31/19 0239 08/01/19 0212 08/02/19 0237 08/03/19 0811  NA 131* 133* 137 140 146*  K 4.0 3.7 5.1 3.9 5.6*  CL 103 104 108 109 113*  CO2 17* 17* 19* 18* 17*  GLUCOSE 296* 313* 159* 295* 235*  BUN 80* 103* 114* 131* 154*  CREATININE 2.46* 2.88* 2.97* 3.14* 3.31*  CALCIUM 8.2* 8.4* 8.5* 8.4* 8.7*   Coagulation Profile: No results for input(s): INR, PROTIME in the last 168 hours. CBG: Recent Labs  Lab 08/02/19 1137  08/02/19 1633 08/02/19 2123 08/03/19 0756 08/03/19 1212  GLUCAP 180* 127* 126* 205* 216*   Scheduled Meds: . budesonide (PULMICORT) nebulizer solution  0.5 mg Nebulization BID  . ipratropium-albuterol  3 mL Nebulization BID  . mouth rinse  15 mL Mouth Rinse BID  . pantoprazole (PROTONIX) IV  40 mg Intravenous Q24H  . sodium chloride flush  3 mL Intravenous Q12H   Continuous Infusions: . sodium chloride    . ceFEPime (MAXIPIME) IV 2 g (08/03/19 0945)     LOS: 9 days    Time spent: 35 minutes  Patrecia Pour, MD

## 2019-08-03 NOTE — Progress Notes (Signed)
NAME:  Erik Logan, MRN:  [id number], DOB:  11-Mar-1929, LOS: 9 ADMISSION DATE:  07/29/2019, CONSULTATION DATE: 07/30/2019 REFERRING MD: Dr. Bonner Puna, [facility], CHIEF COMPLAINT:  Short of breath   Brief History   86 yo former smoker with Lt sided chest pain and dyspnea from acute hypoxic respiratory failure from pneumonia in setting of emphysema and ILD with UIP pattern.  Past Medical History  DM type 2, CAD, HTN, HLD, PUD, CKD 2  Significant Hospital Events   5/09 Admit 5/10 needing Bipap  Consults:    Procedures:    Significant Diagnostic Tests:  Echo 5/11 >> EF 55 to 60%, grade 1 DD HRCT chest 5/14 >> atherosclerosis, severe centrilobular emphysema, bronchial wall thickening, moderate patchy consolidation with GGO b/l, patchy reticulation, mild traction BTX, mild architectural distortion, mild honeycombing, cluster of nodularity in LLL  Micro Data:  SARS CoV2 PCR 5/09 >> negative Influenza PCR 5/09 >> negative Blood 5/09 >> negative Sputum 5/14 >>   Antimicrobials:  Vancomycin 5/09 >> 5/14 Zithromax 5/09 >> 5/13 Cefepime 5/14 >>   Interim history/subjective:   Clearly in distress on my arrival today.  His wife is at bedside.  He has had difficulty tolerating BiPAP and has taken it off.  They are struggling with him to get 1.00 NRB mask in place. Note palliative care meeting planned for this morning He received Dilaudid and Ativan earlier in the morning  Objective   Blood pressure 132/70, pulse 89, temperature 97.6 F (36.4 C), temperature source Axillary, resp. rate (!) 22, height 5\' 8"  (1.727 m), weight 81.3 kg, SpO2 94 %.    FiO2 (%):  [70 %-90 %] 90 %   Intake/Output Summary (Last 24 hours) at 08/03/2019 1118 Last data filed at 08/03/2019 0340 Gross per 24 hour  Intake 240 ml  Output 550 ml  Net -310 ml   Filed Weights   08/01/19 0417 08/02/19 0500 08/03/19 0240  Weight: 85.3 kg 82.9 kg 81.3 kg    Examination: General: Ill-appearing man, uncomfortable,  resisting care HEENT: Oropharynx dry, no upper airway noise Neuro: Moaning, resisting nursing care CV: Tachycardic PULM: Tachypneic, clearly uncomfortable, significant increase in work of breathing GI: Not examined Extremities: No significant edema  Resolved Hospital Problem list     Assessment & Plan:   Acute hypoxic respiratory failure.  Multifactorial and due to apparent bacterial pneumonia superimposed on severe emphysema, probable associated interstitial disease.  Rheumatoid factor positive Clear to me now based on the trend that he is not going to recover from this illness.  He has not been able to tolerate BiPAP, has been more uncomfortable with increased work of breathing despite any form of support. He is wife and sons are at bedside.  They were here preparing for palliative care meeting at 11:00.  I have explained to them that Erik Logan is not going to survive no matter what interventions we make, and that I strongly advised that we concentrate on his comfort and dignity, use narcotics for dyspnea and supplemental oxygen only for comfort.  I explained that he probably only has hours to live and that we can influence how those hours are spent by managing his discomfort.  His wife and sons all understand.  While I was discussing with him Erik Logan with palliative care arrived to meet.  She will extend on our discussions and talk about the interventions to make to concentrate on the patient's comfort.  At this point I would offer narcotics, benzodiazepines if needed, oxygen  if it helps with his comfort.  Defer any more BiPAP.  Stop all noncomfort based medications. We will continue transport family.  Chaplain will be called and we will try to get the patient's extended family and own clergy to visit.    Labs    CMP Latest Ref Rng & Units 08/03/2019 08/02/2019 08/01/2019  Glucose 70 - 99 mg/dL 235(H) 295(H) 159(H)  BUN 8 - 23 mg/dL 154(H) 131(H) 114(H)  Creatinine 0.61 - 1.24 mg/dL  3.31(H) 3.14(H) 2.97(H)  Sodium 135 - 145 mmol/L 146(H) 140 137  Potassium 3.5 - 5.1 mmol/L 5.6(H) 3.9 5.1  Chloride 98 - 111 mmol/L 113(H) 109 108  CO2 22 - 32 mmol/L 17(L) 18(L) 19(L)  Calcium 8.9 - 10.3 mg/dL 8.7(L) 8.4(L) 8.5(L)    CBC Latest Ref Rng & Units 08/01/2019 07/31/2019 07/30/2019  WBC 4.0 - 10.5 K/uL 23.6(H) 23.3(H) 18.8(H)  Hemoglobin 13.0 - 17.0 g/dL 11.6(L) 11.1(L) 11.6(L)  Hematocrit 39.0 - 52.0 % 34.3(L) 33.4(L) 34.4(L)  Platelets 150 - 400 K/uL 301 279 205    CBG (last 3)  Recent Labs    08/02/19 1633 08/02/19 2123 08/03/19 0756  GLUCAP 127* 126* 205*    Signature:     Baltazar Apo, MD, PhD 08/03/2019, 11:18 AM [facility] [phone] or if no answer [phone]

## 2019-08-03 NOTE — Progress Notes (Signed)
Patient ID: Erik Logan, male   DOB: 1928/05/04, 86 y.o.   MRN: [id number]  This NP visited patient at the bedside as a follow up for palliative medicine needs and emotional support, and to meet with family as scheduled for continued conversation regarding current medical situation.  Patient is restless, tachypneic and cyanotic.  Patient has had difficulty tolerating BiPAP  Dr. Fara Olden at bedside  updating the family on medical condition and recommendation for a palliative/comfort path. Continued conversation and education outlining the difference between an aggressive medical intervention path and a palliative comfort path for this patient at this time in this situation.  Education offered on the natural trajectory and expectations of end-of-life.  Plan of care -DNR/DNI -No artificial feeding or hydration now or in the future -No further BiPAP use -No further diagnostics or life prolonging measures -begin to wean Oxygen down from HF to  nasal canula -Symptom management     Morphine 2 mg IV every 1 hour as needed for pain or dyspnea     Ativan 1 mg IV every 4 hours as needed as needed for anxiety -Chaplain consulted - prognosis is likely hours -expect hospital death   Questions and concerns addressed   Discussed with Dr Bonner Puna  Total time spent on the unit was 40 minutes  Greater than 50% of the time was spent in counseling and coordination of care  Wadie Lessen NP  Palliative Medicine Team Team Phone # [phone] Pager [phone]

## 2019-08-03 NOTE — Progress Notes (Signed)
OT Cancellation Note  Patient Details Name: Erik Logan MRN: [id number] DOB: 11-Oct-1928   Cancelled Treatment:    Reason Eval/Treat Not Completed: Medical issues which prohibited therapy;Other (comment) Pt on Bipap with RN reporting pt inappropriate for skilled OT session today. Will check back as time allows.   Lanier Clam., COTA/L Acute Rehabilitation Services [phone] [place] 08/03/2019, 10:22 AM

## 2019-08-03 NOTE — Progress Notes (Signed)
PT Cancellation Note  Patient Details Name: Erik Logan MRN: [id number] DOB: [birth year]   Cancelled Treatment:    Reason Eval/Treat Not Completed: Medical issues which prohibited therapy   Pt on Bipap with RN reporting pt inappropriate for skilled PT session today. Will check back as time allows  Karlton Lemon 08/03/2019, 11:13 AM

## 2019-08-03 NOTE — Plan of Care (Signed)

## 2019-08-04 DIAGNOSIS — R0902 Hypoxemia: Secondary | ICD-10-CM

## 2019-08-04 DIAGNOSIS — R0602 Shortness of breath: Secondary | ICD-10-CM

## 2019-08-06 DIAGNOSIS — D649 Anemia, unspecified: Secondary | ICD-10-CM

## 2019-08-06 DIAGNOSIS — D696 Thrombocytopenia, unspecified: Secondary | ICD-10-CM | POA: Diagnosis present

## 2019-08-06 DIAGNOSIS — Z515 Encounter for palliative care: Secondary | ICD-10-CM

## 2019-08-06 DIAGNOSIS — E872 Acidosis, unspecified: Secondary | ICD-10-CM | POA: Diagnosis present

## 2019-08-07 DIAGNOSIS — R0902 Hypoxemia: Secondary | ICD-10-CM

## 2019-08-07 DIAGNOSIS — R0602 Shortness of breath: Secondary | ICD-10-CM

## 2019-08-16 NOTE — Progress Notes (Signed)
Patient ID: Erik Logan, male   DOB: [birth year], 86 y.o.   MRN: [id number]  This NP visited patient at the bedside as a follow up for palliative medicine needs and emotional support.  Patient is unresponsive to verbal stimuli, appears comfortable, no distress noted.  He is cyanotic, feet are mottled  Wife at bedside, created space and opportunity for wife to explore her thoughts and feelings regarding current medical situation.. She verbalizes her understanding of her husband's limited prognosis and her sadness and grief.  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   We discussed minimizing medications i.e. antibiotics and the recommendation to begin to wean high flow oxygen.  Family  very much wants to and needs to hold onto current level of oxygen    emotional support offered  Education offered on the natural trajectory and expectations of end-of-life.  Plan of care -DNR/DNI -No artificial feeding or hydration now or in the future -No further BiPAP use -No further diagnostics or life prolonging measures -Symptom management     Morphine 2 mg IV every 15 minutes as needed for pain or dyspnea     Ativan 1 mg IV every 4 hours as needed as needed for anxiety -Chaplain consulted - prognosis is likely hours -expect hospital death  Questions and concerns addressed   Discussed with Dr Bonner Puna  Total time spent on the unit was 35 minutes  Greater than 50% of the time was spent in counseling and coordination of care  Wadie Lessen NP  Palliative Medicine Team Team Phone # [phone] Pager [phone]

## 2019-08-16 NOTE — Death Summary Note (Signed)
DEATH SUMMARY   Patient Details  Name: Erik Logan MRN: [id number] DOB: [birth year]  Admission/Discharge Information   Admit Date:  08/03/19  Date of Death: Date of Death: 14-Aug-2019  Time of Death: Time of Death: 06/01/11  Length of Stay: Jun 04, 2022  Referring Physician: Neale Burly, MD   Reason(s) for Hospitalization  Respiratory failure due to pneumonia  Diagnoses  Preliminary cause of death: Acute hypoxic respiratory failure due to pneumonia Secondary Diagnoses (including complications and co-morbidities):  Principal Problem:   Acute respiratory failure with hypoxia ([place]) Active Problems:   Essential (primary) hypertension   Hyperlipidemia   Type 2 diabetes mellitus without complication, with long-term current use of insulin (HCC)   CKD (chronic kidney disease) stage 4, GFR 15-29 ml/min (HCC)   Multifocal pneumonia   Acute on chronic diastolic CHF (congestive heart failure) (HCC)   Coronary artery disease   DNR (do not resuscitate)   UIP (usual interstitial pneumonitis) (HCC)   Emphysema of lung ([place])   History of tobacco abuse   Overweight   Steroid-induced hyperglycemia   Palliative care by specialist   Anxiety   Brief Hospital Course (including significant findings, care, treatment, and services provided and events leading to death)  Erik Logan is an 86 y.o. year old male who has a history of HTN, T2DM, CAD, chronic CHF, stage IV CKD, COPD, and DNR present on admission who initially presented with worsening shortness of breath, cough, and pleuritic chest pain. He was hypoxemic on arrival with elevated troponin consistent with demand myocardial ischemia, respiratory distress, tachypnea, tachycardia, thrombocytopenia with multifocal infiltrates on CXR, negative covid-19 testing. Due to respiratory distress, he required NIPPV, treated empirically for sepsis due to pneumonia with antibiotics for multifocal community acquired pneumonia and diuresis for acute on chronic  diastolic CHF. Unfortunately, hypoxemia progressed. Pulmonology was consulted and antibiotics were broadened empirically. HRCT was performed revealing emphysema, ILD with probable UIP pattern and he was later found to have +RF. Steroids were added, though he continued to require NIPPV and increasing flow with HFNC. Palliative care medicine team was consulted since the patient was developing acute hypoxic and hypercarbic respiratory failure, hyperkalemic AKI on stage IV CKD with worsening respiratory and metabolic acidosis, acute hypoxemic and hypercarbic encephalopathy, severe steroid-induced hyperglycemia on T2DM. After several days of requiring NIPPV and HFNC with worsening respiratory distress requiring IV narcotics and anxiety requiring ativan, the medical and pulmonary critical care teams concluded that this would not be a survivable illness. Medical and palliative care teams met with the family who opted to pursue comfort measures. He was not stable enough for transfer to hospice but was made comfortable, found peace, and was visited by his loved ones before passing on 08/14/19.   Pertinent Labs and Studies  Significant Diagnostic Studies DG Chest 2 View  Result Date: Aug 03, 2019 CLINICAL DATA:  Left-sided chest pain. EXAM: CHEST - 2 VIEW COMPARISON:  March 16, 2019 FINDINGS: Mild-to-moderate severity chronic appearing increased interstitial lung markings are noted bilaterally. Mild atelectasis and/or infiltrate is seen along the lateral aspect of the mid right lung. There is no evidence of a pleural effusion or pneumothorax. The heart size and mediastinal contours are within normal limits. Multilevel degenerative changes seen throughout the thoracic spine. IMPRESSION: 1. Mild atelectasis and/or infiltrate along the lateral aspect of the mid right lung. 2. Chronic appearing increased interstitial lung markings. Electronically Signed   By: Virgina Norfolk M.D.   On: Aug 03, 2019 20:47   CT Chest High  Resolution  Result Date: 07/29/2019 CLINICAL DATA:  Inpatient. Evaluate for interstitial lung disease. EXAM: CT CHEST WITHOUT CONTRAST TECHNIQUE: Multidetector CT imaging of the chest was performed following the standard protocol without intravenous contrast. High resolution imaging of the lungs, as well as inspiratory and expiratory imaging, was performed. COMPARISON:  Chest radiograph from earlier today. FINDINGS: Cardiovascular: Normal heart size. No significant pericardial effusion/thickening. Three-vessel coronary atherosclerosis. Atherosclerotic nonaneurysmal thoracic aorta. Top-normal caliber main pulmonary artery (3.1 cm diameter). Mediastinum/Nodes: No discrete thyroid nodules. Unremarkable esophagus. No axillary adenopathy. Mild right paratracheal adenopathy up to 1.3 cm (series 5/image 68). Mildly enlarged 1.3 cm subcarinal node (series 5/image 86). Mildly enlarged 1.0 cm AP window node (series 5/image 66). Mild bilateral hilar lymphadenopathy, poorly delineated on these noncontrast images. Lungs/Pleura: No pneumothorax. No pleural effusion. Severe centrilobular emphysema with diffuse bronchial wall thickening. Moderate patchy consolidation and ground-glass opacity throughout both lungs, most prominent in the upper lobes bilaterally. There is patchy reticulation and ground-glass opacity throughout both lungs with associated mild traction bronchiectasis and mild architectural distortion. No clear apicobasilar gradient to these findings. Mild apparent honeycombing at the lung bases, most prominent at the peripheral right lung base (series 9/images 11 and 15). No patchy lobular air trapping or evidence of tracheobronchomalacia on the expiration sequence. Clustered nodular irregular opacities in the left lower lobe, largest 1.0 cm (series 6/image 82). No discrete lung masses. Upper abdomen: Small hiatal hernia. Musculoskeletal: No aggressive appearing focal osseous lesions. Moderate thoracic spondylosis.  IMPRESSION: 1. Moderate patchy consolidation and ground-glass opacity throughout both lungs, most prominent in the upper lobes bilaterally, compatible with multilobar pneumonia. 2. Clustered irregular nodular opacities in the left lower lobe, largest 1.0 cm, indeterminate for inflammatory versus neoplastic etiology. Recommend attention on follow-up high-resolution chest CT in 3 months. 3. Spectrum of findings suggestive of underlying fibrotic interstitial lung disease with mild honeycombing. Usual interstitial pneumonia (UIP) suspected, although evaluation is limited by the presence of the superimposed multilobar pneumonia. Suggest follow-up high-resolution chest CT study in 3 months. 4. Mild mediastinal and bilateral hilar lymphadenopathy, nonspecific, most likely reactive. 5. Small hiatal hernia. 6. Aortic Atherosclerosis (ICD10-I70.0) and Emphysema (ICD10-J43.9). Electronically Signed   By: Ilona Sorrel M.D.   On: 07/29/2019 12:57   DG Chest Port 1 View  Result Date: 08/01/2019 CLINICAL DATA:  Respiratory failure. EXAM: PORTABLE CHEST 1 VIEW COMPARISON:  Plain film and CT of 07/28/2019. FINDINGS: Midline trachea. Normal heart size. No pleural effusion or pneumothorax. Further improvement in right middle lung and left upper lung airspace disease. Underlying interstitial lung disease again identified. No new consolidation. IMPRESSION: 1. Further improvement in bilateral pneumonia. 2. Underlying interstitial lung disease, as on high-resolution chest CT. Electronically Signed   By: Abigail Miyamoto M.D.   On: 08/01/2019 08:11   DG CHEST PORT 1 VIEW  Result Date: 07/28/2019 CLINICAL DATA:  Respiratory failure. EXAM: PORTABLE CHEST 1 VIEW COMPARISON:  Chest x-ray dated Jul 25, 2019. FINDINGS: Lordotic positioning. Stable cardiomediastinal silhouette. Normal pulmonary vascularity. Poor inspiration compared to the prior study. Peripheral and basilar predominant coarse interstitial markings again noted with improving  superimposed consolidation in the right lung. No pleural effusion or pneumothorax. No acute osseous abnormality. IMPRESSION: 1. Improving right-sided pneumonia superimposed on chronic interstitial lung disease. Electronically Signed   By: Titus Dubin M.D.   On: 07/28/2019 08:43   DG CHEST PORT 1 VIEW  Result Date: 07/25/2019 CLINICAL DATA:  Shortness of breath EXAM: PORTABLE CHEST 1 VIEW COMPARISON:  Jul 24, 2019 and March 16, 2019  FINDINGS: There is underlying fibrosis. There is airspace opacity in the periphery of the right mid lung and right base, likely pneumonia superimposed on fibrosis. Slight increase in opacity in these areas compared to 1 day prior. Heart size is normal. Pulmonary vascularity is within normal limits. Note that there is somewhat diminished vascularity in the upper lobes, likely due to underlying emphysematous change. No adenopathy. There is degenerative change in each shoulder. IMPRESSION: Underlying apparent emphysematous change in fibrosis. Apparent pneumonia in the right mid and lower lung zones peripherally, with increased opacity in these areas compared to 1 day prior. Cardiac silhouette stable.  No evident adenopathy. Electronically Signed   By: Lowella Grip III M.D.   On: 07/25/2019 11:01   ECHOCARDIOGRAM COMPLETE  Result Date: 07/26/2019    ECHOCARDIOGRAM REPORT   Patient Name:   ELIUD POLO Date of Exam: 07/26/2019 Medical Rec #:  [id number]     Height:       68.0 in Accession #:    [id number]    Weight:       186.5 lb Date of Birth:  [birth year]    BSA:          1.984 m Patient Age:    74 years      BP:           134/60 mmHg Patient Gender: M             HR:           67 bpm. Exam Location:  Inpatient Procedure: 2D Echo, Color Doppler and Cardiac Doppler Indications:    R06.9 DOE  History:        Patient has prior history of Echocardiogram examinations, most                 recent 06/03/2017. CHF, NSTEMI; Risk Factors:Hypertension,                 Diabetes and  Dyslipidemia.  Sonographer:    Raquel Sarna Senior RDCS Referring Phys: [id number] [place]  Sonographer Comments: Technically difficult due to poor echo windows and respiratory variation. No parasternal or suprasternal window. IMPRESSIONS  1. Left ventricular ejection fraction, by estimation, is 55 to 60%. The left ventricle has normal function. The left ventricle has no regional wall motion abnormalities. Left ventricular diastolic parameters are consistent with Grade I diastolic dysfunction (impaired relaxation). Elevated left atrial pressure.  2. Right ventricular systolic function is normal. The right ventricular size is normal. Tricuspid regurgitation signal is inadequate for assessing PA pressure.  3. The mitral valve is normal in structure. No evidence of mitral valve regurgitation. No evidence of mitral stenosis.  4. The aortic valve was not well visualized. Aortic valve regurgitation is not visualized. No aortic stenosis is present.  5. The inferior vena cava is normal in size with greater than 50% respiratory variability, suggesting right atrial pressure of 3 mmHg. Comparison(s): No significant change from prior study. FINDINGS  Left Ventricle: Left ventricular ejection fraction, by estimation, is 55 to 60%. The left ventricle has normal function. The left ventricle has no regional wall motion abnormalities. The left ventricular internal cavity size was normal in size. There is  no left ventricular hypertrophy. Left ventricular diastolic parameters are consistent with Grade I diastolic dysfunction (impaired relaxation). Elevated left atrial pressure. Right Ventricle: The right ventricular size is normal. No increase in right ventricular wall thickness. Right ventricular systolic function is normal. Tricuspid regurgitation signal is inadequate for assessing PA pressure. Left  Atrium: Left atrial size was normal in size. Right Atrium: Right atrial size was normal in size. Pericardium: There is no  evidence of pericardial effusion. Mitral Valve: The mitral valve is normal in structure. Normal mobility of the mitral valve leaflets. Mild mitral annular calcification. No evidence of mitral valve regurgitation. No evidence of mitral valve stenosis. Tricuspid Valve: The tricuspid valve is normal in structure. Tricuspid valve regurgitation is not demonstrated. No evidence of tricuspid stenosis. Aortic Valve: The aortic valve was not well visualized. Aortic valve regurgitation is not visualized. No aortic stenosis is present. Pulmonic Valve: The pulmonic valve was normal in structure. Pulmonic valve regurgitation is not visualized. No evidence of pulmonic stenosis. Aorta: The aortic root is normal in size and structure. Venous: The inferior vena cava is normal in size with greater than 50% respiratory variability, suggesting right atrial pressure of 3 mmHg. IAS/Shunts: No atrial level shunt detected by color flow Doppler.  LEFT VENTRICLE PLAX 2D LVIDd:         3.70 cm  Diastology LVIDs:         2.70 cm  LV e' lateral:   4.13 cm/s LV PW:         0.90 cm  LV E/e' lateral: 14.9 LV IVS:        0.80 cm  LV e' medial:    4.13 cm/s LVOT diam:     2.00 cm  LV E/e' medial:  14.9 LV SV:         60 LV SV Index:   30 LVOT Area:     3.14 cm  RIGHT VENTRICLE RV S prime:     9.25 cm/s TAPSE (M-mode): 2.0 cm LEFT ATRIUM             Index       RIGHT ATRIUM           Index LA diam:        2.30 cm 1.16 cm/m  RA Area:     15.00 cm LA Vol (A2C):   63.4 ml 31.96 ml/m RA Volume:   34.50 ml  17.39 ml/m LA Vol (A4C):   32.6 ml 16.43 ml/m LA Biplane Vol: 48.1 ml 24.24 ml/m  AORTIC VALVE LVOT Vmax:   83.60 cm/s LVOT Vmean:  64.300 cm/s LVOT VTI:    0.190 m  AORTA Ao Root diam: 2.80 cm MITRAL VALVE MV Area (PHT): 2.48 cm     SHUNTS MV Decel Time: 306 msec     Systemic VTI:  0.19 m MV E velocity: 61.70 cm/s   Systemic Diam: 2.00 cm MV A velocity: 116.00 cm/s MV E/A ratio:  0.53 Mihai Croitoru MD Electronically signed by Sanda Klein  MD Signature Date/Time: 07/26/2019/2:19:01 PM    Final     Microbiology Recent Results (from the past 240 hour(s))  Culture, sputum-assessment     Status: None   Collection Time: 07/29/19 11:20 AM   Specimen: Expectorated Sputum  Result Value Ref Range Status   Specimen Description EXPECTORATED SPUTUM  Final   Special Requests NONE  Final   Sputum evaluation   Final    THIS SPECIMEN IS ACCEPTABLE FOR SPUTUM CULTURE Performed at [facility], [address] [place], [place] [address]    Report Status 07/31/2019 FINAL  Final  Culture, respiratory     Status: None   Collection Time: 07/29/19 11:20 AM  Result Value Ref Range Status   Specimen Description EXPECTORATED SPUTUM  Final   Special Requests NONE  Reflexed from [id number]  Final   Gram Stain   Final    MODERATE WBC PRESENT, PREDOMINANTLY PMN RARE GRAM NEGATIVE RODS    Culture   Final    Consistent with normal respiratory flora. Performed at [facility], [place] [address], [place], [place] [address]    Report Status 08/01/2019 FINAL  Final  MRSA PCR Screening     Status: None   Collection Time: 07/29/19 11:30 AM   Specimen: Nasopharyngeal  Result Value Ref Range Status   MRSA by PCR NEGATIVE NEGATIVE Final    Comment:        The GeneXpert MRSA Assay (FDA approved for NASAL specimens only), is one component of a comprehensive MRSA colonization surveillance program. It is not intended to diagnose MRSA infection nor to guide or monitor treatment for MRSA infections. Performed at [facility], [place] [address], [place] [address]     Lab Basic Metabolic Panel: Recent Labs  Lab 07/30/19 0228 07/31/19 0239 08/01/19 0212 08/02/19 0237 08/03/19 0811  NA 131* 133* 137 140 146*  K 4.0 3.7 5.1 3.9 5.6*  CL 103 104 108 109 113*  CO2 17* 17* 19* 18* 17*  GLUCOSE 296* 313* 159* 295* 235*  BUN 80* 103* 114* 131* 154*  CREATININE 2.46* 2.88* 2.97* 3.14* 3.31*  CALCIUM 8.2* 8.4* 8.5* 8.4* 8.7*    Liver Function Tests: No results for input(s): AST, ALT, ALKPHOS, BILITOT, PROT, ALBUMIN in the last 168 hours. No results for input(s): LIPASE, AMYLASE in the last 168 hours. No results for input(s): AMMONIA in the last 168 hours. CBC: Recent Labs  Lab 07/30/19 0228 07/31/19 0239 08/01/19 0212  WBC 18.8* 23.3* 23.6*  HGB 11.6* 11.1* 11.6*  HCT 34.4* 33.4* 34.3*  MCV 86.4 86.5 86.8  PLT 205 279 301   Cardiac Enzymes: No results for input(s): CKTOTAL, CKMB, CKMBINDEX, TROPONINI in the last 168 hours. Sepsis Labs: Recent Labs  Lab 07/30/19 0228 07/31/19 0239 08/01/19 0212 08/03/19 0811  PROCALCITON  --   --   --  0.73  WBC 18.8* 23.3* 23.6*  --     Procedures/Operations     Patrecia Pour, MD 08/05/2019, 7:09 AM

## 2019-08-16 NOTE — Progress Notes (Signed)
Brief note: I stopped by Dr. Royston Sinner room this morning. He has remained with SpO2 in 60%'s overnight and appears very calm in no distress. His wife has been at bedside all night, is sitting by him holding his hand. He is not responsive. She is appropriately tearful undergoing grief process. Family was allowed to visit yesterday. I discussed the option to discontinue all monitors and wean oxygen. I'm not sure she and/or her family is amenable to this, though they do understand that the patient is at end of life. I anticipate death sometime today. Will continue to support in any way possible the family and keep controlling patient's symptoms.  Vance Gather, MD 08-10-19 11:11 AM

## 2019-08-16 DEATH — deceased

## 2019-08-31 ENCOUNTER — Ambulatory Visit: Payer: Medicare Other | Admitting: Dermatology

## 2020-07-22 IMAGING — CR DG CHEST 2V
2 series · 2 of 2 positions shown · non-contrast
Comparison: March 16, 2019

CLINICAL DATA: Left-sided chest pain.

EXAM:
CHEST - 2 VIEW

[chest pa]
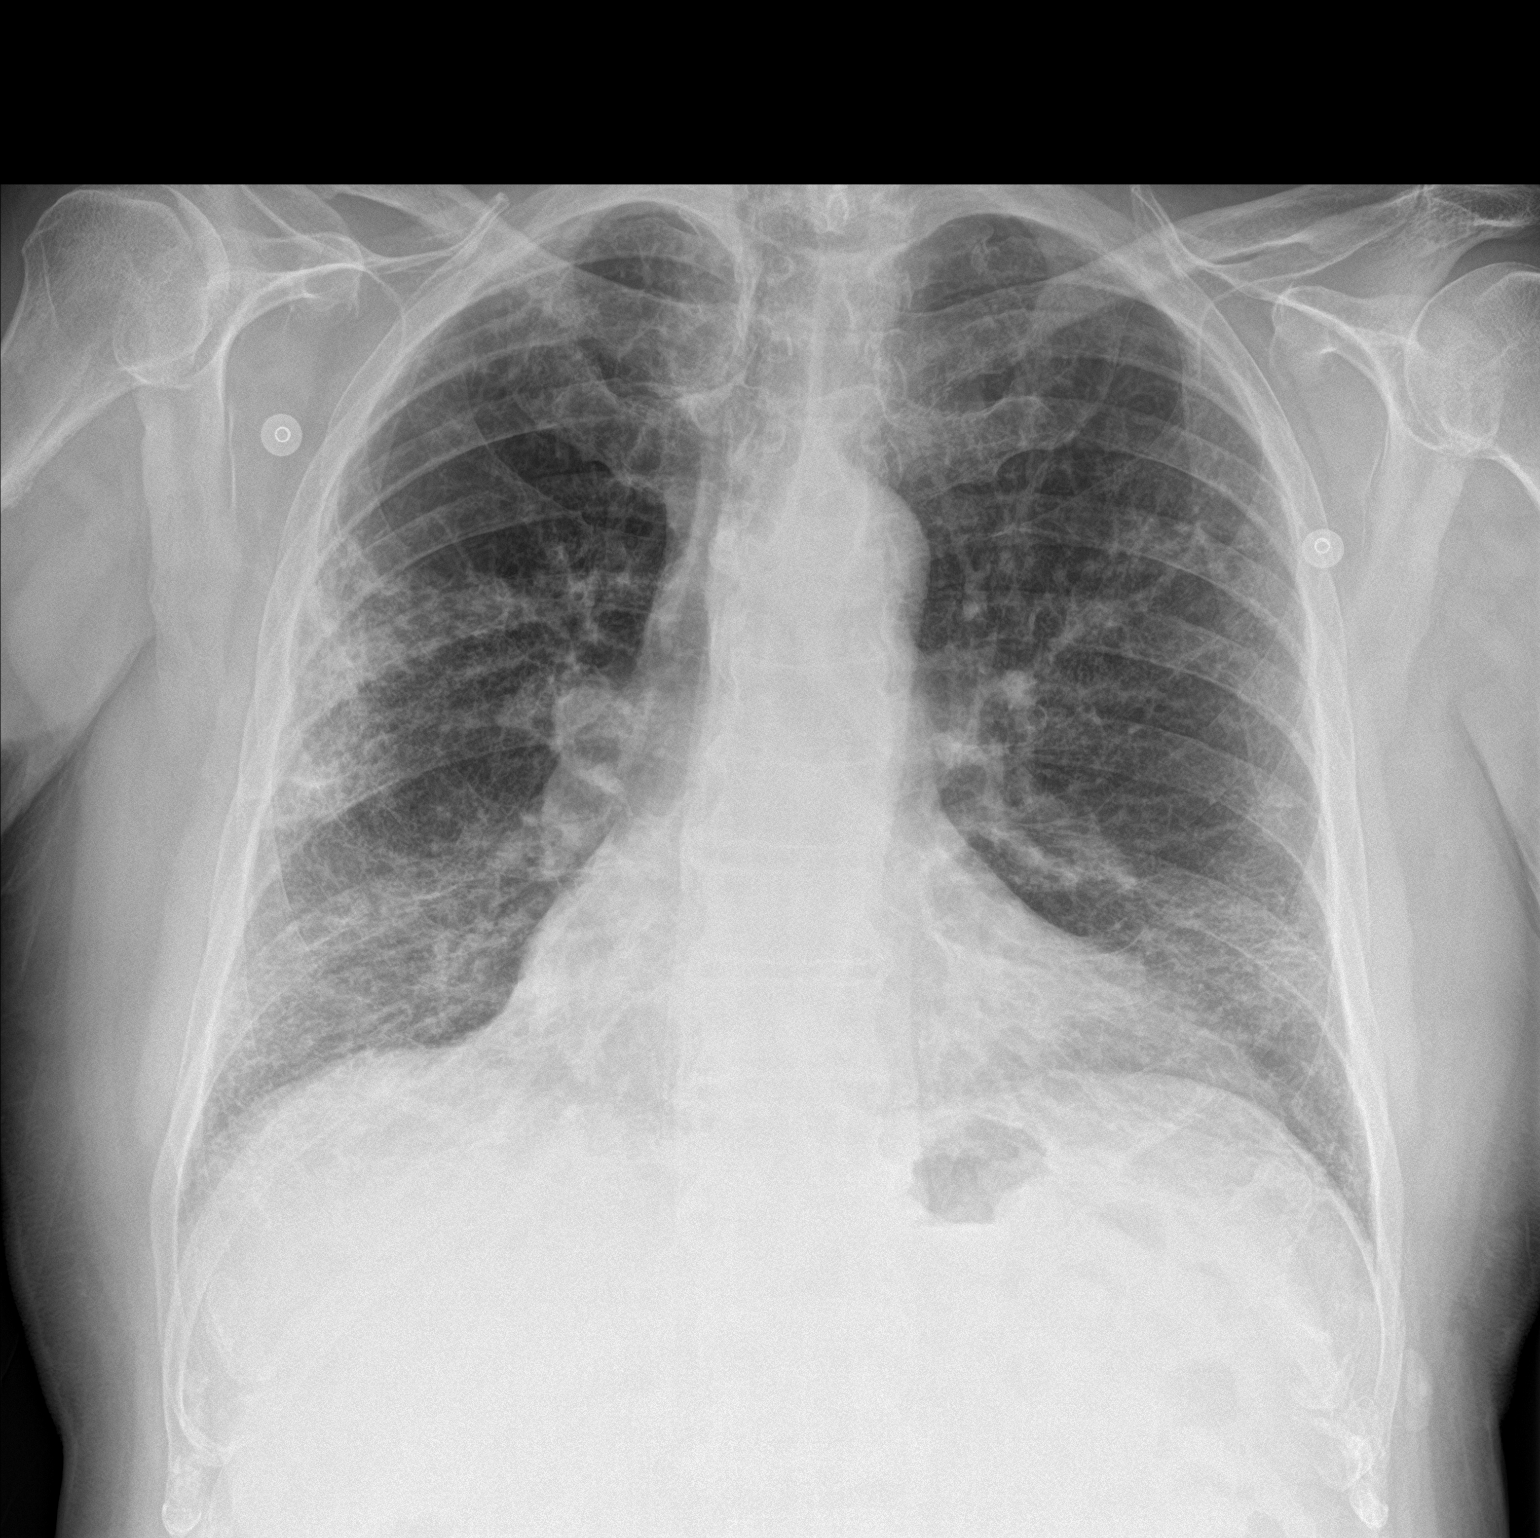

[chest lat]
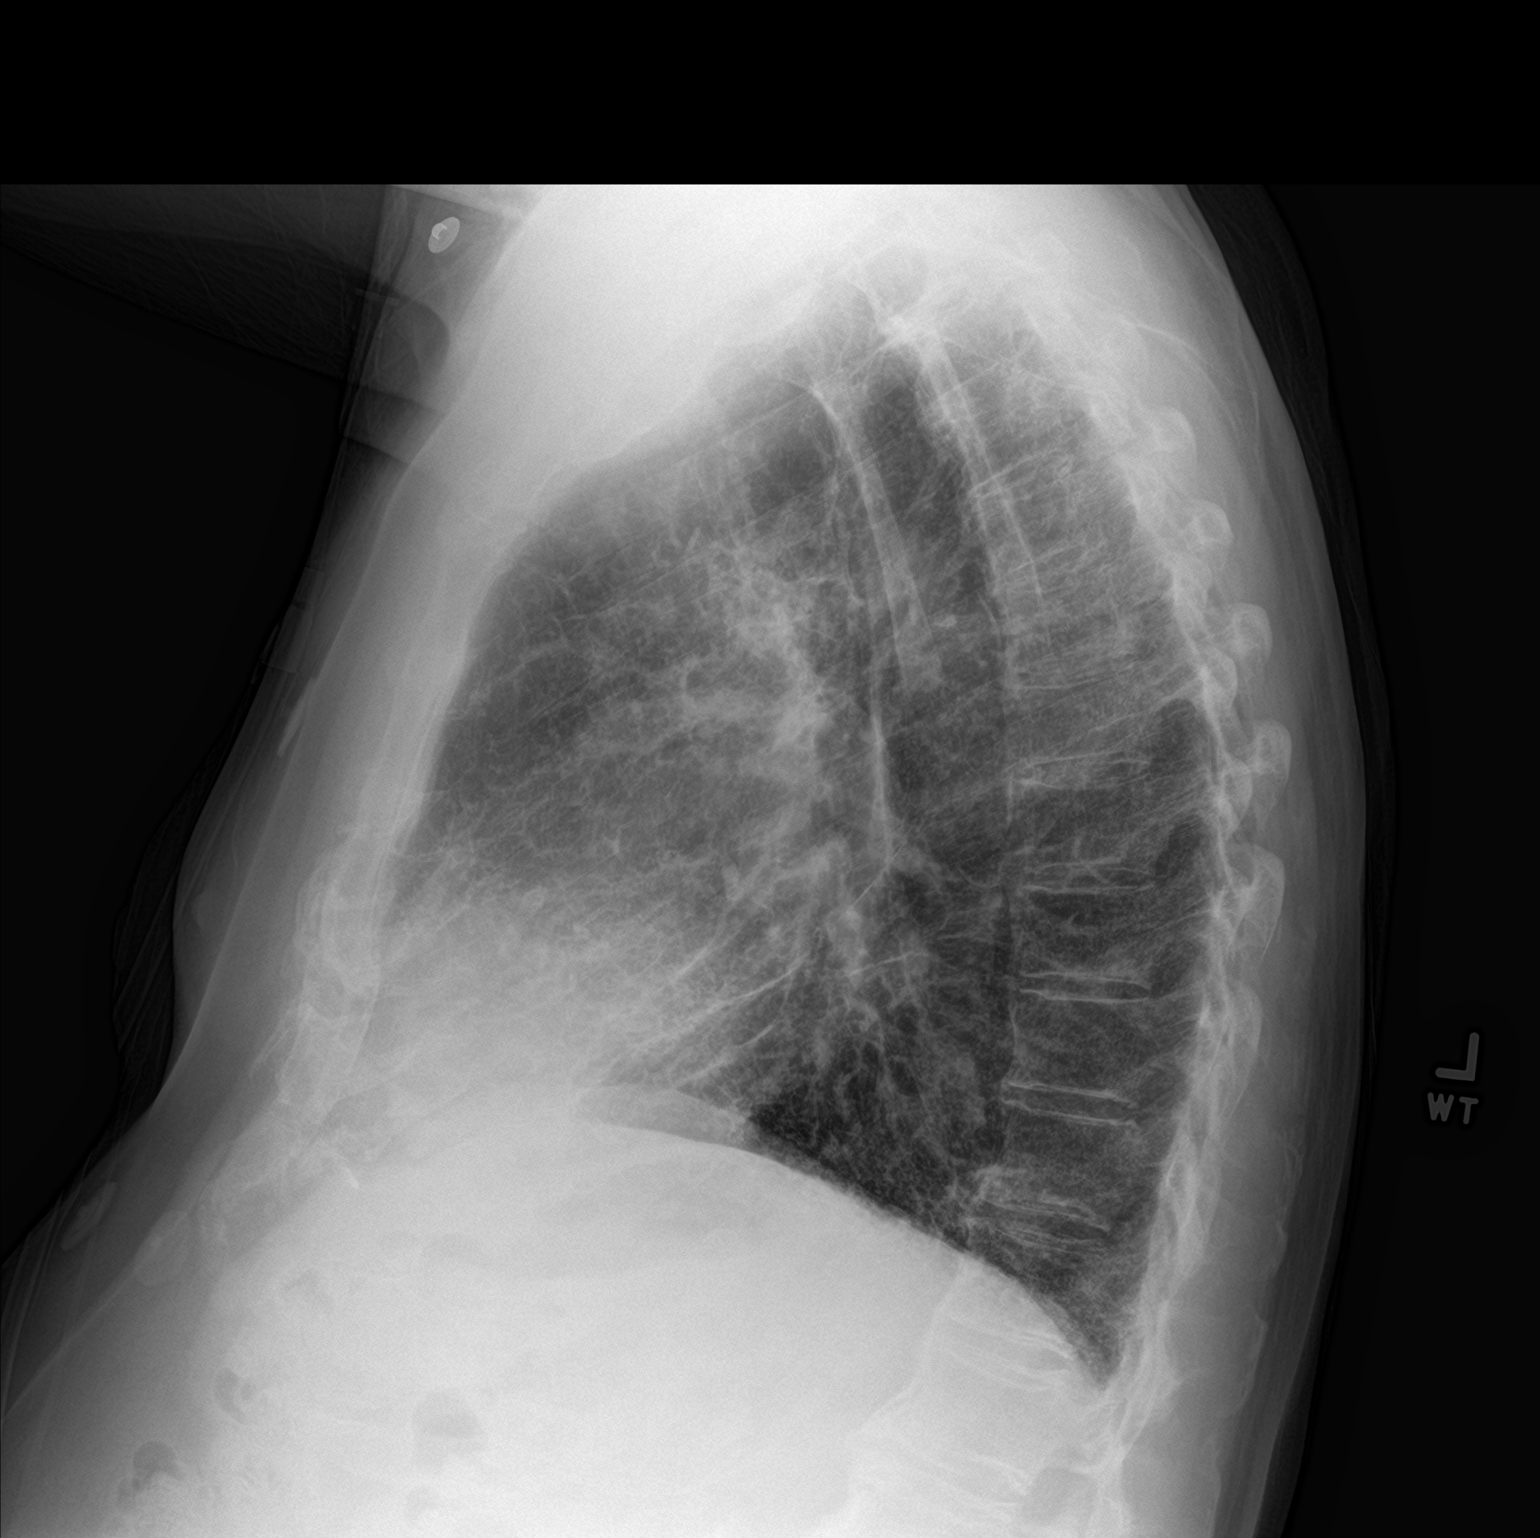

[2 of 2 positions shown; findings below may reference images not displayed]

FINDINGS: Mild-to-moderate severity chronic appearing increased interstitial
lung markings are noted bilaterally. Mild atelectasis and/or
infiltrate is seen along the lateral aspect of the mid right lung.
There is no evidence of a pleural effusion or pneumothorax. The
heart size and mediastinal contours are within normal limits.
Multilevel degenerative changes seen throughout the thoracic spine.
IMPRESSION: 1. Mild atelectasis and/or infiltrate along the lateral aspect of
the mid right lung.
2. Chronic appearing increased interstitial lung markings.

## 2020-07-26 IMAGING — CT CT CHEST HIGH RESOLUTION W/O CM
2 of 6 series · 14 of 36 positions shown, 17 images · non-contrast
Comparison: Chest radiograph from earlier today.

CLINICAL DATA: Inpatient. Evaluate for interstitial lung disease.

EXAM:
CT CHEST WITHOUT CONTRAST
TECHNIQUE: Multidetector CT imaging of the chest was performed following the
standard protocol without intravenous contrast. High resolution
imaging of the lungs, as well as inspiratory and expiratory imaging,
was performed.

[Series 7: high resolution thins · axial · 0.77mm/px · z∈[+1098,+1374]mm · 11 of 476 slices shown, 14 images]
[im 26/476  mediastinal]
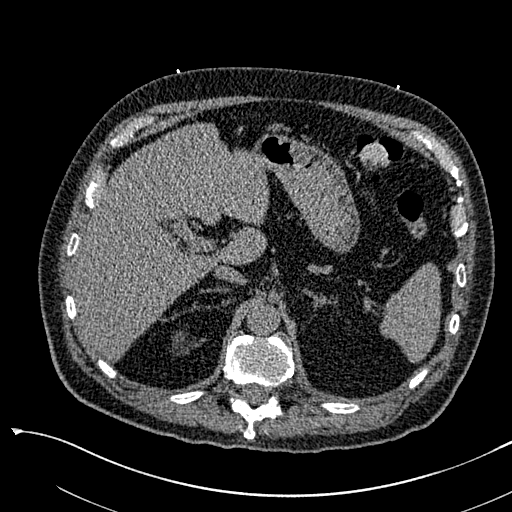
[im 26/476  lung]
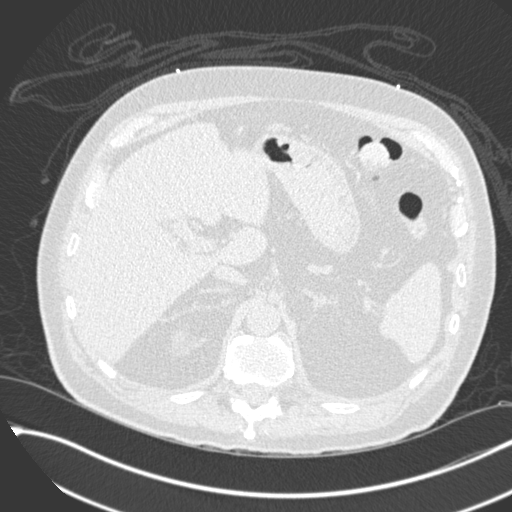
[im 76/476  lung]
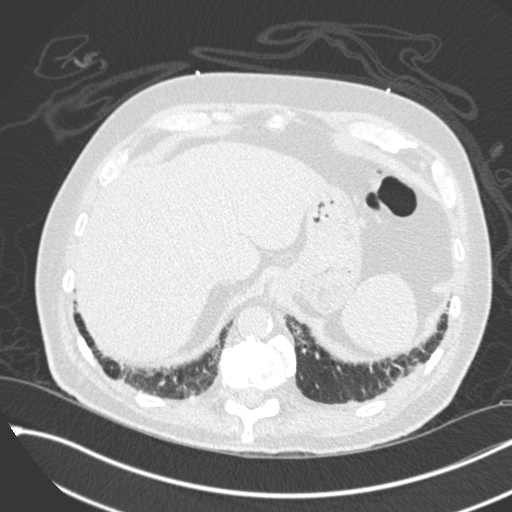
[im 126/476  lung]
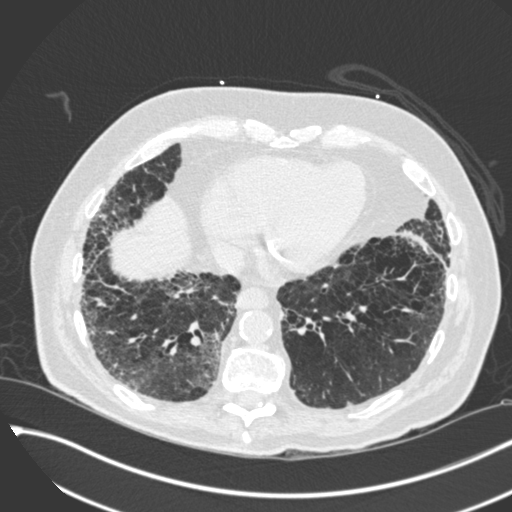
[im 151/476  lung]
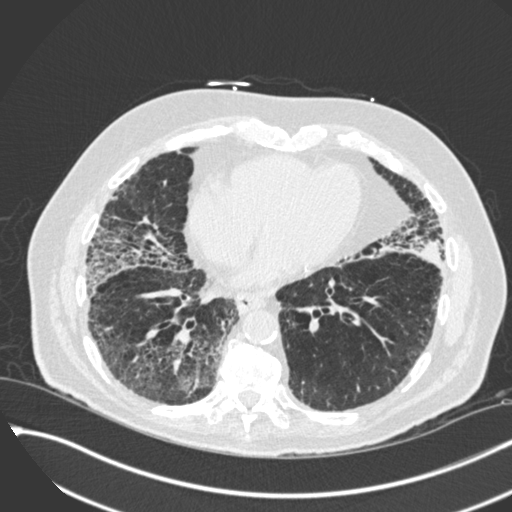
[im 201/476  mediastinal]
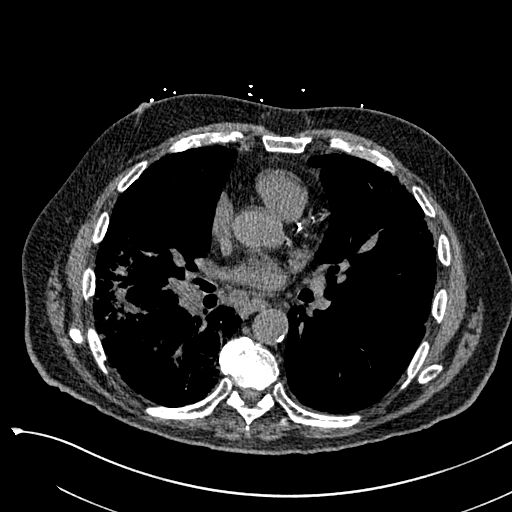
[im 201/476  lung]
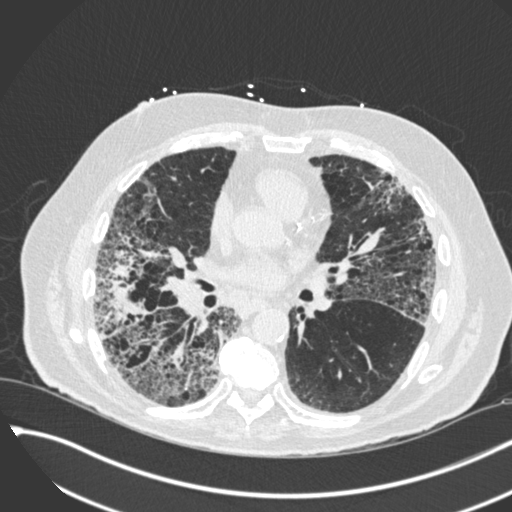
[im 251/476  lung]
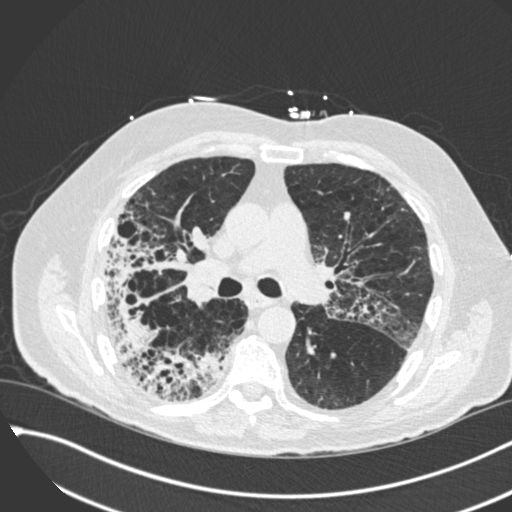
[im 276/476  lung]
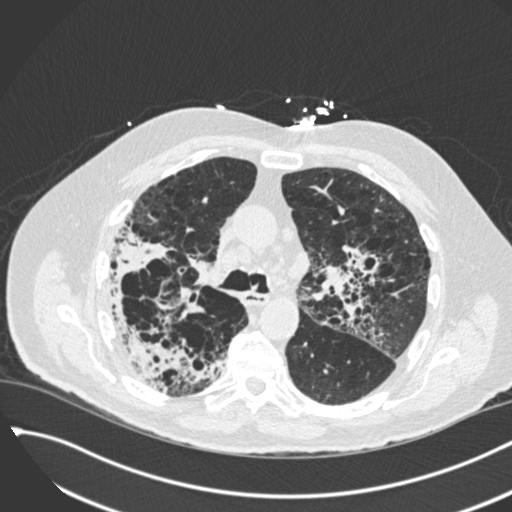
[im 326/476  lung]
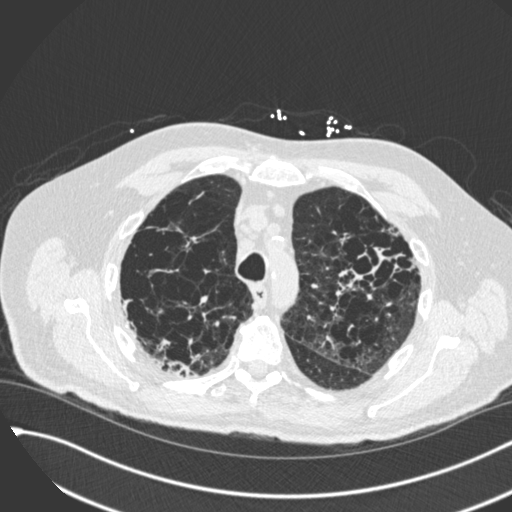
[im 351/476  mediastinal]
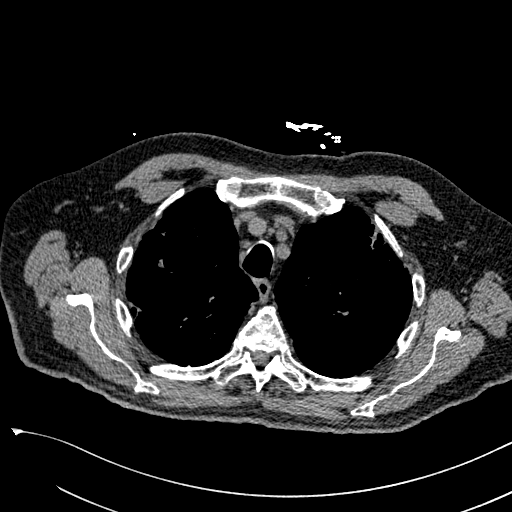
[im 351/476  lung]
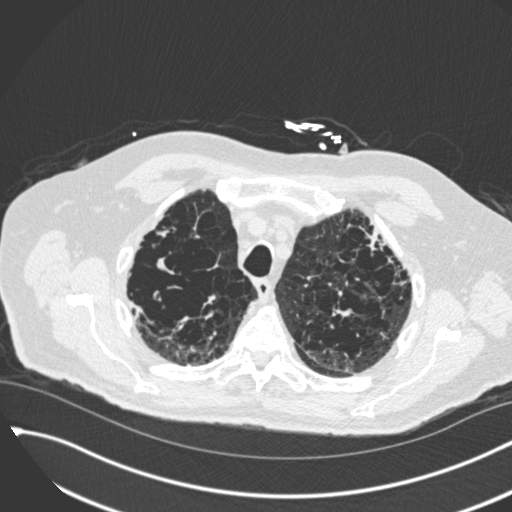
[im 401/476  lung]
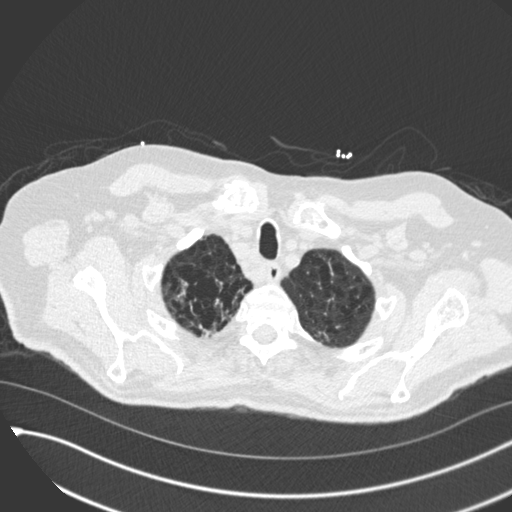
[im 451/476  lung]
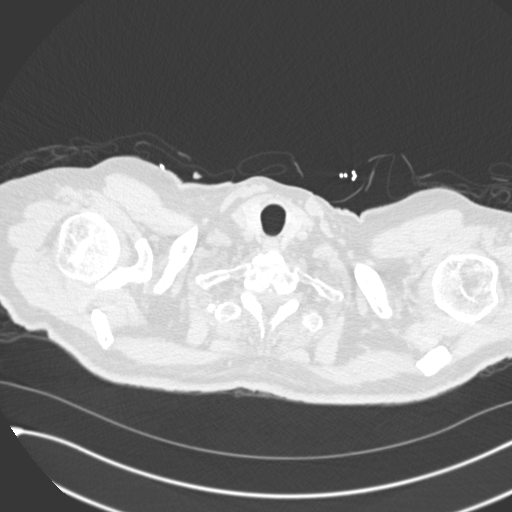

[Series 8: coronal · coronal · 0.59mm/px · 3 of 147 slices shown]
[im 30/147  lung]
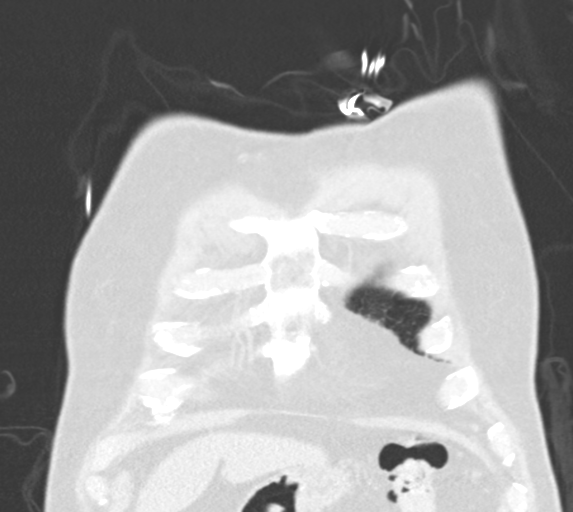
[im 59/147  lung]
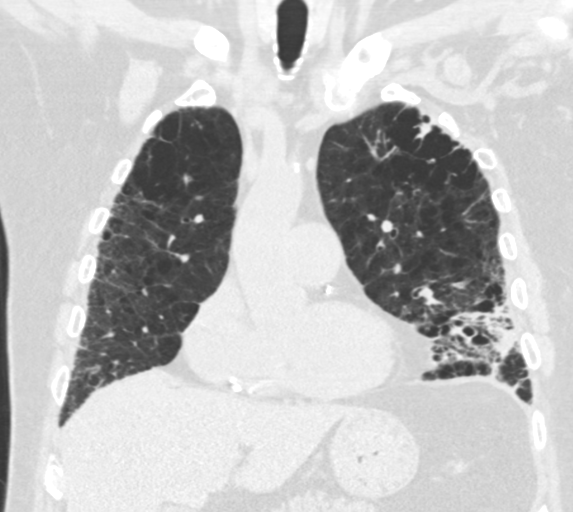
[im 88/147  lung]
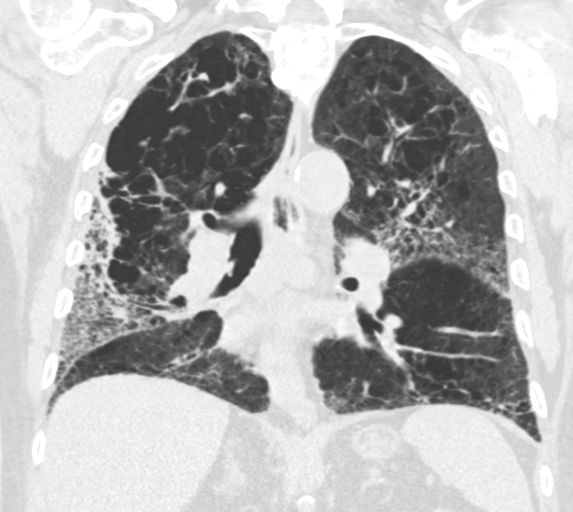

[14 of 36 positions shown; findings below may reference images not displayed]

FINDINGS: Cardiovascular: Normal heart size. No significant pericardial
effusion/thickening. Three-vessel coronary atherosclerosis.
Atherosclerotic nonaneurysmal thoracic aorta. Top-normal caliber
main pulmonary artery (3.1 cm diameter).

Mediastinum/Nodes: No discrete thyroid nodules. Unremarkable
esophagus. No axillary adenopathy. Mild right paratracheal
adenopathy up to 1.3 cm (series 5/image 68). Mildly enlarged 1.3 cm
subcarinal node (series 5/image 86). Mildly enlarged 1.0 cm AP
window node (series 5/image 66). Mild bilateral hilar
lymphadenopathy, poorly delineated on these noncontrast images.

Lungs/Pleura: No pneumothorax. No pleural effusion. Severe
centrilobular emphysema with diffuse bronchial wall thickening.
Moderate patchy consolidation and ground-glass opacity throughout
both lungs, most prominent in the upper lobes bilaterally. There is
patchy reticulation and ground-glass opacity throughout both lungs
with associated mild traction bronchiectasis and mild architectural
distortion. No clear apicobasilar gradient to these findings. Mild
apparent honeycombing at the lung bases, most prominent at the
peripheral right lung base (series 9/images 11 and 15). No patchy
lobular air trapping or evidence of tracheobronchomalacia on the
expiration sequence. Clustered nodular irregular opacities in the
left lower lobe, largest 1.0 cm (series 6/image 82). No discrete
lung masses.

Upper abdomen: Small hiatal hernia.

Musculoskeletal: No aggressive appearing focal osseous lesions.
Moderate thoracic spondylosis.
IMPRESSION: 1. Moderate patchy consolidation and ground-glass opacity throughout
both lungs, most prominent in the upper lobes bilaterally,
compatible with multilobar pneumonia.
2. Clustered irregular nodular opacities in the left lower lobe,
largest 1.0 cm, indeterminate for inflammatory versus neoplastic
etiology. Recommend attention on follow-up high-resolution chest CT
in 3 months.
3. Spectrum of findings suggestive of underlying fibrotic
interstitial lung disease with mild honeycombing. Usual interstitial
pneumonia (UIP) suspected, although evaluation is limited by the
presence of the superimposed multilobar pneumonia. Suggest follow-up
high-resolution chest CT study in 3 months.
4. Mild mediastinal and bilateral hilar lymphadenopathy,
nonspecific, most likely reactive.
5. Small hiatal hernia.
6. Aortic Atherosclerosis (LE4YM-CZT.T) and Emphysema (LE4YM-3CD.I).

## 2020-07-26 IMAGING — DX DG CHEST 1V PORT
1 series · 1 of 1 positions shown · non-contrast
Comparison: Chest x-ray dated July 25, 2019.

CLINICAL DATA: Respiratory failure.

EXAM:
PORTABLE CHEST 1 VIEW

[chest]
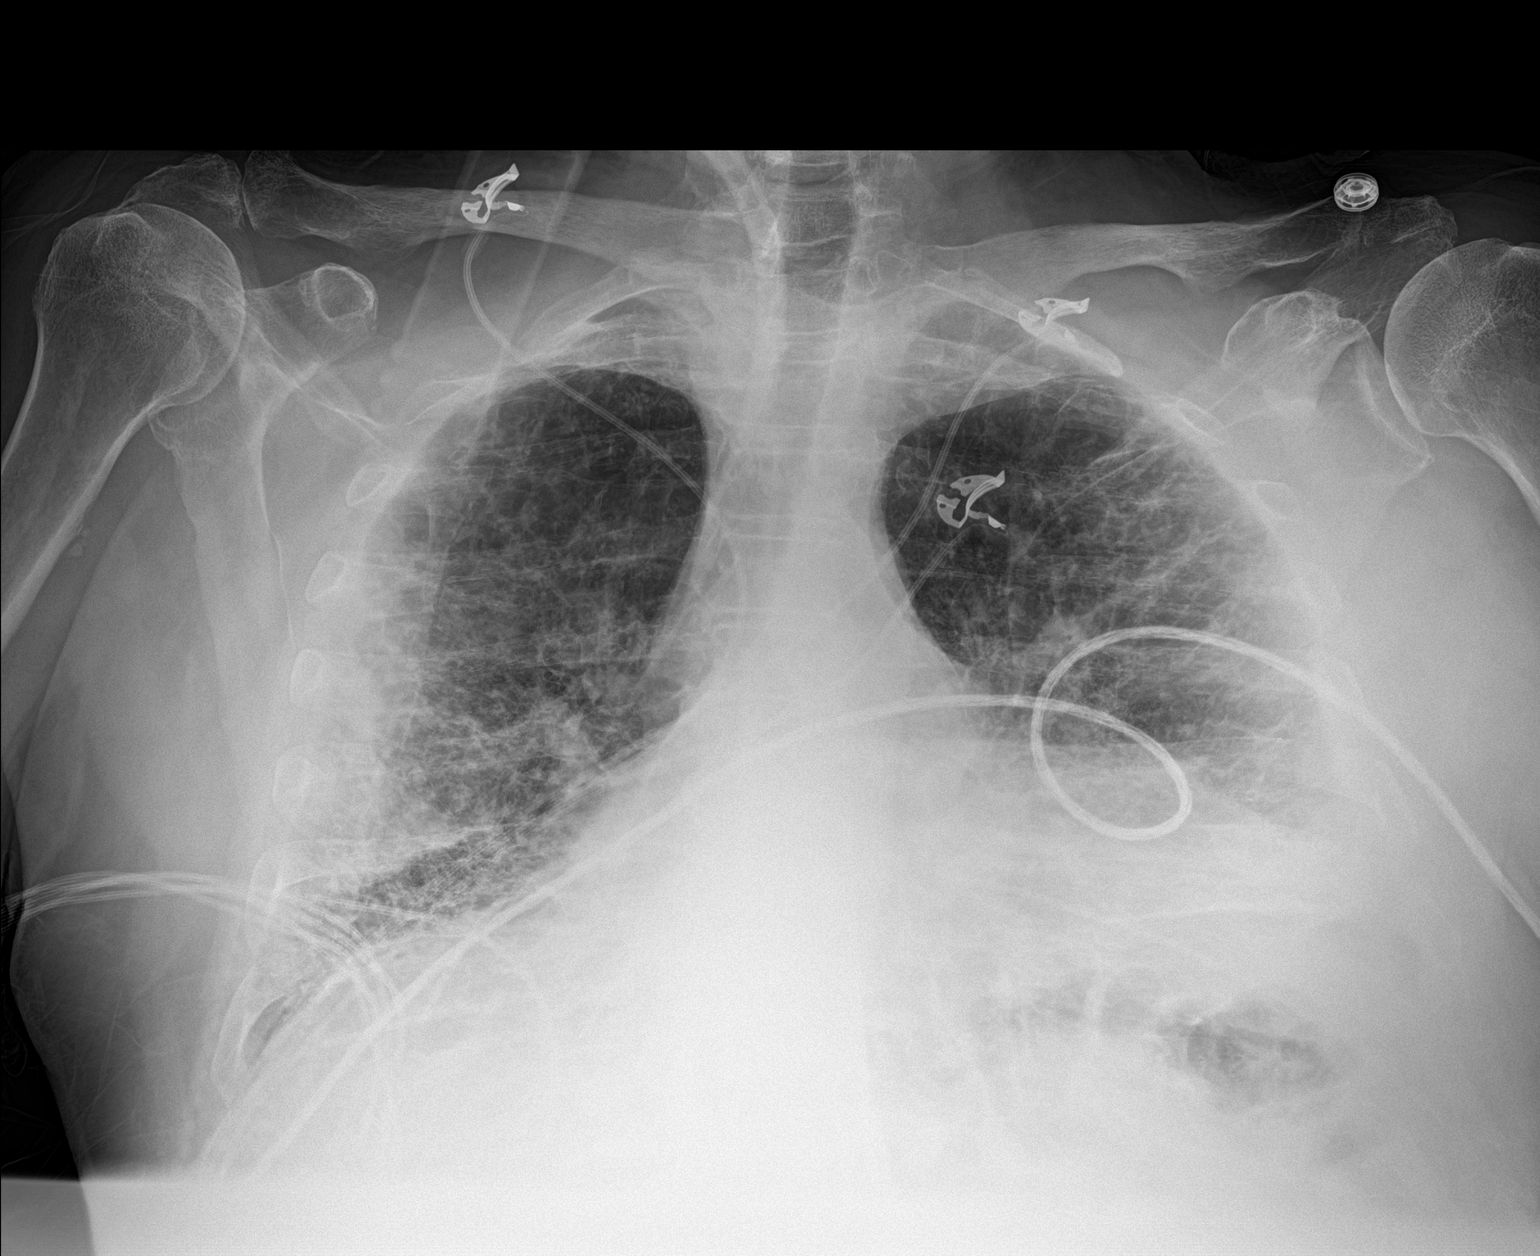

[1 of 1 positions shown; findings below may reference images not displayed]

FINDINGS: Lordotic positioning. Stable cardiomediastinal silhouette. Normal
pulmonary vascularity. Poor inspiration compared to the prior study.
Peripheral and basilar predominant coarse interstitial markings
again noted with improving superimposed consolidation in the right
lung. No pleural effusion or pneumothorax. No acute osseous
abnormality.
IMPRESSION: 1. Improving right-sided pneumonia superimposed on chronic
interstitial lung disease.

## 2020-07-30 IMAGING — DX DG CHEST 1V PORT
1 series · 1 of 1 positions shown · non-contrast
Comparison: Plain film and CT of 07/28/2019.

CLINICAL DATA: Respiratory failure.

EXAM:
PORTABLE CHEST 1 VIEW

[chest ap]
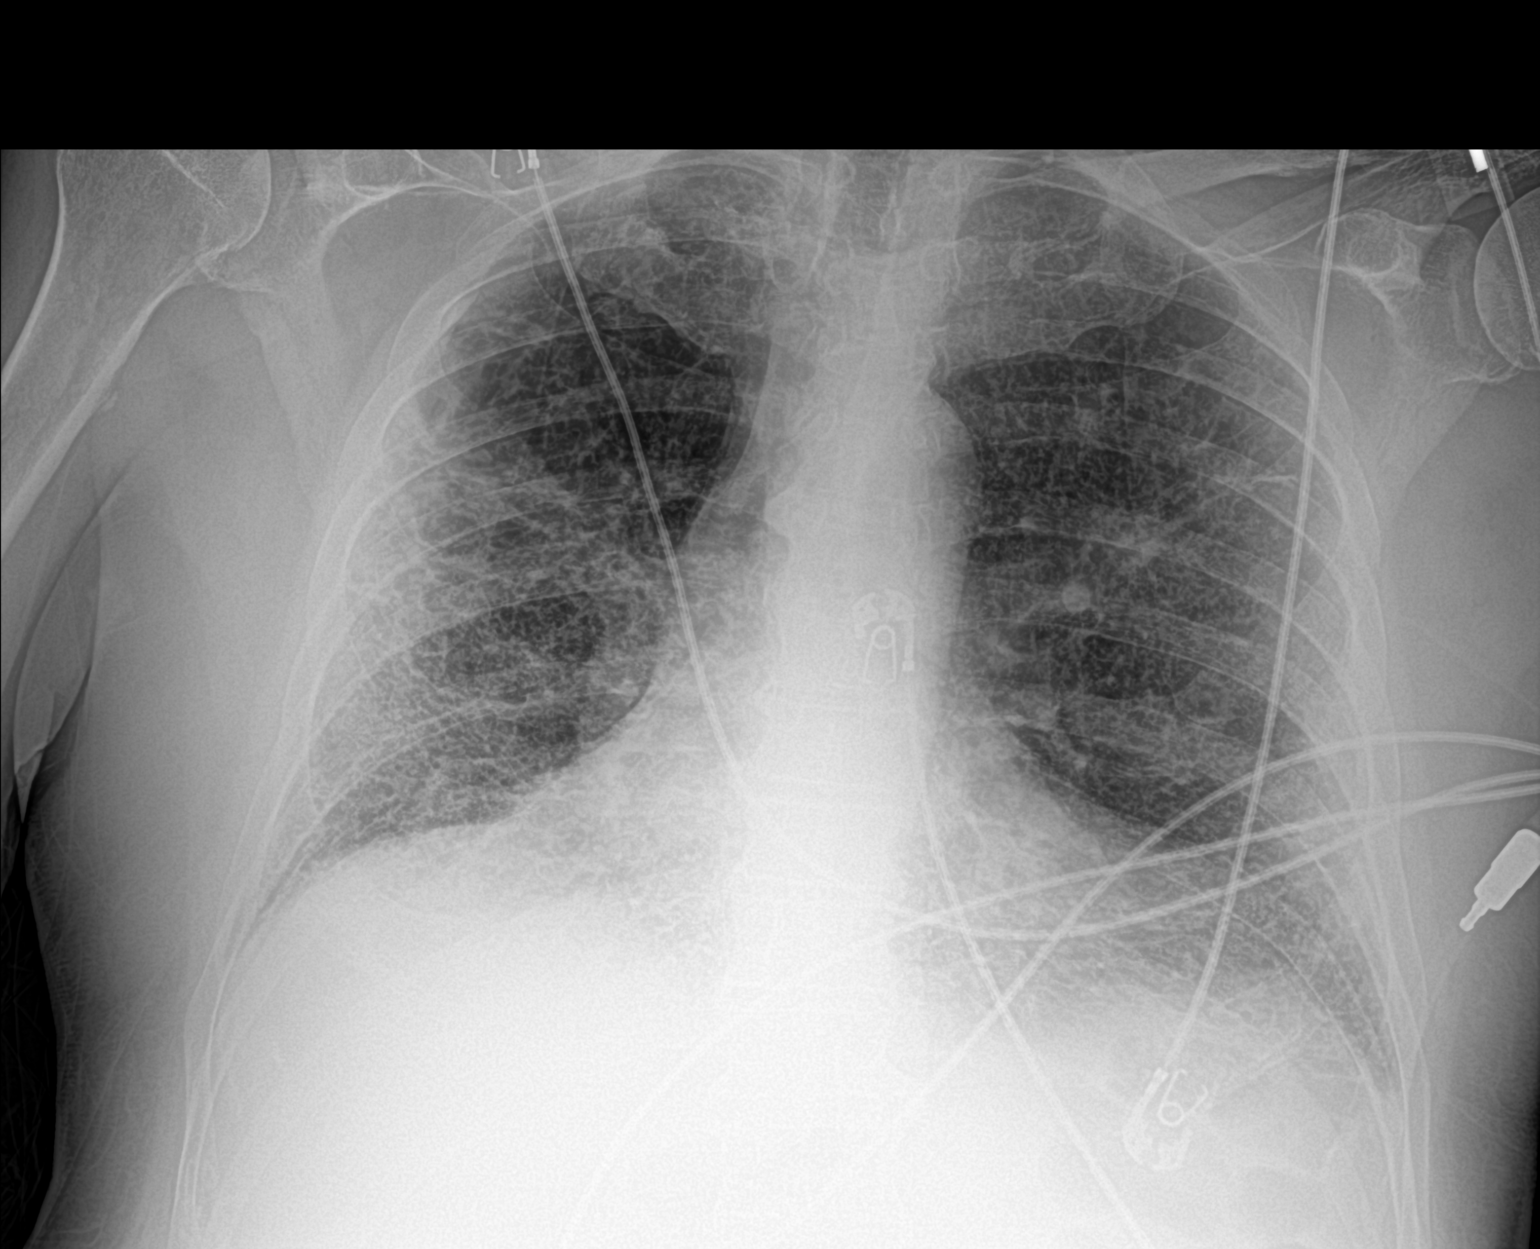

[1 of 1 positions shown; findings below may reference images not displayed]

FINDINGS: Midline trachea. Normal heart size. No pleural effusion or
pneumothorax. Further improvement in right middle lung and left
upper lung airspace disease. Underlying interstitial lung disease
again identified. No new consolidation.
IMPRESSION: 1. Further improvement in bilateral pneumonia.
2. Underlying interstitial lung disease, as on high-resolution chest
CT.
